# Patient Record
Sex: Male | Born: 1995 | Race: Black or African American | Hispanic: No | Marital: Married | Smoking: Former smoker
Health system: Southern US, Community
[De-identification: ages and names within clinical notes are randomized; demographics above are authoritative.]

## PROBLEM LIST (undated history)

## (undated) DIAGNOSIS — E785 Hyperlipidemia, unspecified: Secondary | ICD-10-CM

## (undated) DIAGNOSIS — N189 Chronic kidney disease, unspecified: Secondary | ICD-10-CM

## (undated) DIAGNOSIS — K219 Gastro-esophageal reflux disease without esophagitis: Secondary | ICD-10-CM

## (undated) DIAGNOSIS — J45909 Unspecified asthma, uncomplicated: Secondary | ICD-10-CM

## (undated) DIAGNOSIS — I10 Essential (primary) hypertension: Secondary | ICD-10-CM

## (undated) HISTORY — DX: Gastro-esophageal reflux disease without esophagitis: K21.9

## (undated) HISTORY — DX: Chronic kidney disease, unspecified: N18.9

## (undated) HISTORY — DX: Unspecified asthma, uncomplicated: J45.909

## (undated) HISTORY — PX: CARDIAC CATHETERIZATION: SHX172

---
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
## 2016-08-11 ENCOUNTER — Encounter: Payer: Self-pay | Admitting: Family Medicine

## 2016-08-11 ENCOUNTER — Ambulatory Visit (INDEPENDENT_AMBULATORY_CARE_PROVIDER_SITE_OTHER): Payer: Medicaid Other | Admitting: Family Medicine

## 2016-08-11 VITALS — BP 118/78 | HR 52 | Temp 98.0°F | Resp 16 | Ht 71.0 in | Wt 200.1 lb

## 2016-08-11 DIAGNOSIS — Z87442 Personal history of urinary calculi: Secondary | ICD-10-CM | POA: Insufficient documentation

## 2016-08-11 DIAGNOSIS — H00011 Hordeolum externum right upper eyelid: Secondary | ICD-10-CM

## 2016-08-11 DIAGNOSIS — Z7689 Persons encountering health services in other specified circumstances: Secondary | ICD-10-CM

## 2016-08-11 DIAGNOSIS — Z8249 Family history of ischemic heart disease and other diseases of the circulatory system: Secondary | ICD-10-CM | POA: Diagnosis not present

## 2016-08-11 DIAGNOSIS — Z9109 Other allergy status, other than to drugs and biological substances: Secondary | ICD-10-CM

## 2016-08-11 NOTE — Patient Instructions (Signed)
Need old records please  I have referred to urology for evaluation kidney stones  I have referred to eye doctor for stye  I have ordered blood work to screen for cholesterol and diabetes  See me yearly

## 2016-08-11 NOTE — Progress Notes (Signed)
Chief Complaint  Patient presents with  . 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 Kidney stones and recurrent back pain 2. Stye on  R eye for over a year 3. Chronic insomnia     Patient Active Problem List   Diagnosis Date Noted  . Environmental allergies 08/11/2016  . History of kidney stones 08/11/2016  . Family history of premature coronary heart disease 08/11/2016    Outpatient Encounter Prescriptions as of 08/11/2016  Medication Sig  . cetirizine (ZYRTEC) 10 MG tablet Take 10 mg by mouth daily.  . montelukast (SINGULAIR) 10 MG tablet Take 10 mg by mouth at bedtime.   No facility-administered encounter medications on file as of 08/11/2016.     Past Medical History:  Diagnosis Date  . Asthma   . Chronic kidney disease    stones  . GERD (gastroesophageal reflux disease)     No past surgical history on file.  Social History   Social History  . Marital status: Single    Spouse name: N/A  . Number of children: N/A  . Years of education: N/A   Occupational History  . Not on file.   Social History Main Topics  . Smoking status: Never Smoker  . Smokeless tobacco: Never Used  . Alcohol use No  . Drug use: No  . Sexual activity: Yes    Birth control/ protection: Pill   Other Topics Concern  . Not on file   Social History Narrative  . No narrative on file    Family History  Problem Relation Age of Onset  . Asthma Mother   . Cancer Mother     breast  . Miscarriages / Stillbirths Mother   . Kidney disease Mother     stones    . Diabetes Father   . Heart disease Father 43     MI  . Asthma Brother     Review of Systems  Constitutional: Negative for chills, fever and weight loss.  HENT: Negative for congestion and hearing loss.   Eyes: Positive for redness. Negative for blurred vision and pain.  Respiratory: Negative for cough and shortness of breath.   Cardiovascular: Negative for chest pain and leg swelling.  Gastrointestinal: Negative for abdominal pain, constipation, diarrhea and heartburn.  Genitourinary: Negative for dysuria and frequency.  Musculoskeletal: Positive for back pain. Negative for falls, joint pain and myalgias.  Neurological: Negative for dizziness, seizures and headaches.  Psychiatric/Behavioral: Negative for depression. The patient has insomnia. The patient is not nervous/anxious.     BP 118/78 (BP Location: Left Arm, Patient Position: Sitting, Cuff Size: Normal)   Pulse (!) 52   Temp 98 F (36.7 C) (Oral)   Resp 16   Ht 5\' 11"  (1.803 m)   Wt 200 lb 1.9 oz (90.8 kg)   SpO2 100%   BMI 27.91 kg/m   Physical Exam  Constitutional: He is oriented to person, place, and time. He appears well-developed and well-nourished.  HENT:  Head: Normocephalic and atraumatic.  Right Ear: External ear normal.  Left Ear:  External ear normal.  Mouth/Throat: Oropharynx is clear and moist.  Eyes: Conjunctivae are normal. Pupils are equal, round, and reactive to light. Right eye exhibits hordeolum.    Neck: Normal range of motion. Neck supple. No thyromegaly present.  Cardiovascular: Normal rate, regular rhythm and normal heart sounds.   Pulmonary/Chest: Effort normal and breath sounds normal. No respiratory distress.  Abdominal: Soft. Bowel sounds are normal.  No CVAT  Musculoskeletal: Normal range of motion. He exhibits no edema.  Lymphadenopathy:    He has no cervical adenopathy.  Neurological: He is alert and oriented to person, place, and time.  Gait normal  Skin: Skin is warm and dry.   Psychiatric: He has a normal mood and affect. His behavior is normal. Thought content normal.  Nursing note and vitals reviewed.  ASSESSMENT/PLAN:  1. Environmental allergies Continue current therapy  2. History of kidney stones Drink water! - Ambulatory referral to Urology  3. Family history of premature coronary heart disease - Lipid panel - Glucose  4. Encounter to establish care with new doctor   5. Hordeolum externum of right upper eyelid - Ambulatory referral to Ophthalmology   Patient Instructions  Need old records please  I have referred to urology for evaluation kidney stones  I have referred to eye doctor for stye  I have ordered blood work to screen for cholesterol and diabetes  See me yearly   Thomas MooreYvonne Sue Mellonie Guess, MD

## 2016-08-12 ENCOUNTER — Encounter: Payer: Self-pay | Admitting: Family Medicine

## 2016-08-12 LAB — LIPID PANEL
CHOL/HDL RATIO: 4.7 ratio (ref <5.0)
Cholesterol: 180 mg/dL — ABNORMAL HIGH (ref <170)
HDL: 38 mg/dL — ABNORMAL LOW (ref >45)
LDL CALC: 118 mg/dL — AB (ref <110)
Triglycerides: 120 mg/dL — ABNORMAL HIGH (ref <90)
VLDL: 24 mg/dL (ref <30)

## 2016-08-12 LAB — GLUCOSE, RANDOM: GLUCOSE: 87 mg/dL (ref 65–99)

## 2017-06-04 ENCOUNTER — Telehealth: Payer: Self-pay | Admitting: Family Medicine

## 2017-06-04 NOTE — Telephone Encounter (Signed)
These referrals were placed at his December visit.

## 2017-06-04 NOTE — Telephone Encounter (Signed)
Patient brought in his changed medicaid card so he could get his referrals to the kidney specialist & eye doctor. Requesting [place] location because he goes to school there. Cb#: [phone]

## 2017-08-11 ENCOUNTER — Ambulatory Visit: Payer: Medicaid Other | Admitting: Family Medicine

## 2017-09-18 ENCOUNTER — Encounter: Payer: Self-pay | Admitting: Family Medicine

## 2017-09-18 ENCOUNTER — Ambulatory Visit (INDEPENDENT_AMBULATORY_CARE_PROVIDER_SITE_OTHER): Payer: Medicaid Other | Admitting: Family Medicine

## 2017-09-18 ENCOUNTER — Other Ambulatory Visit: Payer: Self-pay

## 2017-09-18 VITALS — BP 116/72 | HR 60 | Temp 97.5°F | Resp 16 | Ht 71.0 in | Wt 198.1 lb

## 2017-09-18 DIAGNOSIS — Z0000 Encounter for general adult medical examination without abnormal findings: Secondary | ICD-10-CM

## 2017-09-18 DIAGNOSIS — E786 Lipoprotein deficiency: Secondary | ICD-10-CM

## 2017-09-18 DIAGNOSIS — L74519 Primary focal hyperhidrosis, unspecified: Secondary | ICD-10-CM | POA: Insufficient documentation

## 2017-09-18 DIAGNOSIS — L749 Eccrine sweat disorder, unspecified: Secondary | ICD-10-CM | POA: Diagnosis not present

## 2017-09-18 DIAGNOSIS — H0013 Chalazion right eye, unspecified eyelid: Secondary | ICD-10-CM | POA: Insufficient documentation

## 2017-09-18 DIAGNOSIS — H0011 Chalazion right upper eyelid: Secondary | ICD-10-CM

## 2017-09-18 DIAGNOSIS — Z23 Encounter for immunization: Secondary | ICD-10-CM | POA: Diagnosis not present

## 2017-09-18 DIAGNOSIS — Z87442 Personal history of urinary calculi: Secondary | ICD-10-CM | POA: Diagnosis not present

## 2017-09-18 MED ORDER — ALUMINUM CHLORIDE 20 % EX SOLN: Freq: Every day | CUTANEOUS | 11 refills | Status: DC

## 2017-09-18 NOTE — Patient Instructions (Signed)
Push fluids Drink water every day I have placed referral to urology I have placed referral for the eye bump Try the dry sol for the perspiration Let me know if this is NOT effective and we will do lab testing

## 2017-09-18 NOTE — Progress Notes (Signed)
Chief Complaint  Patient presents with  . Follow-up    13 months   Patient is here for an annual wellness exam. He eats well and exercises.  We discussed his low HDL.  We discussed the basics of a low-cholesterol diet. He had a chalazion when he was here last year.  He still has a bump on his eyelid.  He is referred to ophthalmology. He has a history of kidney stones.  Verified on CAT scan.  He has periodic back pain.  He tries to drink a lot of water.  He would like a referral to urology to see if these need to be treated. His only new complaint is of excessive perspiration.  Only his axilla.  It happens randomly, even at rest.  We discussed differential diagnosis.  Treatment options.  I will give him a prescription for Drysol. We discussed yearly flu shots are recommended.  He declines.  Patient Active Problem List   Diagnosis Date Noted  . Low HDL (under 40) 09/18/2017  . Chalazion of right eye 09/18/2017  . Excessive sweating, local 09/18/2017  . Environmental allergies 08/11/2016  . History of kidney stones 08/11/2016  . Family history of premature coronary heart disease 08/11/2016    Outpatient Encounter Medications as of 09/18/2017  Medication Sig  . cetirizine (ZYRTEC) 10 MG tablet Take 10 mg by mouth daily.  Marland Kitchen aluminum chloride (DRYSOL) 20 % external solution Apply topically at bedtime.  . montelukast (SINGULAIR) 10 MG tablet Take 10 mg by mouth at bedtime.   No facility-administered encounter medications on file as of 09/18/2017.     No Known Allergies  Review of Systems  Constitutional: Positive for diaphoresis. Negative for activity change, appetite change, chills, fever and unexpected weight change.  HENT: Negative for congestion and dental problem.   Eyes: Negative.  Negative for photophobia and visual disturbance.       Bump on right upper eyelid  Respiratory: Negative for cough and choking.   Cardiovascular: Negative for chest pain and palpitations.    Gastrointestinal: Negative for constipation and diarrhea.  Genitourinary: Positive for flank pain. Negative for difficulty urinating and frequency.  Musculoskeletal: Negative for arthralgias and back pain.  Neurological: Negative for light-headedness and headaches.  Psychiatric/Behavioral: Negative for decreased concentration and dysphoric mood.    BP 116/72 (BP Location: Left Arm, Patient Position: Sitting, Cuff Size: Normal)   Pulse 60   Temp (!) 97.5 F (36.4 C) (Temporal)   Resp 16   Ht 5\' 11"  (1.803 m)   Wt 198 lb 1.3 oz (89.8 kg)   SpO2 100%   BMI 27.63 kg/m   Physical Exam BP 116/72 (BP Location: Left Arm, Patient Position: Sitting, Cuff Size: Normal)   Pulse 60   Temp (!) 97.5 F (36.4 C) (Temporal)   Resp 16   Ht 5\' 11"  (1.803 m)   Wt 198 lb 1.3 oz (89.8 kg)   SpO2 100%   BMI 27.63 kg/m   General Appearance:    Alert, cooperative, no distress, appears stated age  Head:    Normocephalic, without obvious abnormality, atraumatic  Eyes:    PERRL, conjunctiva/corneas clear, EOM's intact, fundi    benign, both eyes.  Small chalazion right upper lid       Ears:    Normal TM's and external ear canals, both ears  Nose:   Nares normal, septum midline, mucosa normal, no drainage   or sinus tenderness  Throat:   Lips, mucosa, and tongue normal;  teeth and gums normal  Neck:   Supple, symmetrical, trachea midline, no adenopathy;       thyroid:  No enlargement/tenderness/nodules; no carotid   bruit or JVD  Back:     Symmetric, no curvature, ROM normal, no CVA tenderness  Lungs:     Clear to auscultation bilaterally, respirations unlabored  Chest wall:    No tenderness or deformity  Heart:    Regular rate and rhythm, S1 and S2 normal, no murmur, rub   or gallop  Abdomen:     Soft, non-tender, bowel sounds active all four quadrants,    no masses, no organomegaly  Extremities:   Extremities normal, atraumatic, no cyanosis or edema  Pulses:   2+ and symmetric all extremities   Skin:   Skin color, texture, turgor normal, no rashes or lesions  Lymph nodes:   Cervical, supraclavicular, and axillary nodes normal  Neurologic:   CNII-XII intact. Normal strength, sensation and reflexes      throughout    ASSESSMENT/PLAN:  1. History of kidney stones  - Ambulatory referral to Urology  2. Sweating abnormality   3. Chalazion of right upper eyelid  - Ambulatory referral to Ophthalmology  4. Need for influenza vaccination  - Flu Vaccine QUAD 36+ mos IM  5. Low HDL (under 40)   6. Excessive sweating, local  7.  Annual exam  Patient Instructions  Push fluids Drink water every day I have placed referral to urology I have placed referral for the eye bump Try the dry sol for the perspiration Let me know if this is NOT effective and we will do lab testing   Thomas Moore, MD

## 2018-02-05 ENCOUNTER — Encounter: Payer: Self-pay | Admitting: Family Medicine

## 2021-07-11 ENCOUNTER — Emergency Department (HOSPITAL_BASED_OUTPATIENT_CLINIC_OR_DEPARTMENT_OTHER): Payer: 59

## 2021-07-11 ENCOUNTER — Encounter (HOSPITAL_BASED_OUTPATIENT_CLINIC_OR_DEPARTMENT_OTHER): Payer: Self-pay | Admitting: Emergency Medicine

## 2021-07-11 ENCOUNTER — Other Ambulatory Visit: Payer: Self-pay

## 2021-07-11 ENCOUNTER — Inpatient Hospital Stay (HOSPITAL_BASED_OUTPATIENT_CLINIC_OR_DEPARTMENT_OTHER)
Admission: EM | Admit: 2021-07-11 | Discharge: 2021-07-16 | DRG: 286 | Disposition: A | Discharge status: Home / Self Care | Payer: 59 | Attending: Internal Medicine | Admitting: Internal Medicine

## 2021-07-11 DIAGNOSIS — Z79899 Other long term (current) drug therapy: Secondary | ICD-10-CM | POA: Diagnosis not present

## 2021-07-11 DIAGNOSIS — Z8249 Family history of ischemic heart disease and other diseases of the circulatory system: Secondary | ICD-10-CM

## 2021-07-11 DIAGNOSIS — I5021 Acute systolic (congestive) heart failure: Principal | ICD-10-CM | POA: Diagnosis present

## 2021-07-11 DIAGNOSIS — I5082 Biventricular heart failure: Secondary | ICD-10-CM | POA: Diagnosis present

## 2021-07-11 DIAGNOSIS — M25551 Pain in right hip: Secondary | ICD-10-CM | POA: Diagnosis present

## 2021-07-11 DIAGNOSIS — R634 Abnormal weight loss: Secondary | ICD-10-CM | POA: Diagnosis present

## 2021-07-11 DIAGNOSIS — I081 Rheumatic disorders of both mitral and tricuspid valves: Secondary | ICD-10-CM | POA: Diagnosis present

## 2021-07-11 DIAGNOSIS — I428 Other cardiomyopathies: Secondary | ICD-10-CM | POA: Diagnosis present

## 2021-07-11 DIAGNOSIS — M25552 Pain in left hip: Secondary | ICD-10-CM | POA: Diagnosis present

## 2021-07-11 DIAGNOSIS — Z6826 Body mass index (BMI) 26.0-26.9, adult: Secondary | ICD-10-CM

## 2021-07-11 DIAGNOSIS — Z23 Encounter for immunization: Secondary | ICD-10-CM | POA: Diagnosis not present

## 2021-07-11 DIAGNOSIS — Z825 Family history of asthma and other chronic lower respiratory diseases: Secondary | ICD-10-CM

## 2021-07-11 DIAGNOSIS — I5020 Unspecified systolic (congestive) heart failure: Secondary | ICD-10-CM | POA: Diagnosis not present

## 2021-07-11 DIAGNOSIS — E876 Hypokalemia: Secondary | ICD-10-CM | POA: Diagnosis not present

## 2021-07-11 DIAGNOSIS — Z841 Family history of disorders of kidney and ureter: Secondary | ICD-10-CM

## 2021-07-11 DIAGNOSIS — F1290 Cannabis use, unspecified, uncomplicated: Secondary | ICD-10-CM | POA: Diagnosis present

## 2021-07-11 DIAGNOSIS — I340 Nonrheumatic mitral (valve) insufficiency: Secondary | ICD-10-CM | POA: Diagnosis not present

## 2021-07-11 DIAGNOSIS — N189 Chronic kidney disease, unspecified: Secondary | ICD-10-CM | POA: Diagnosis present

## 2021-07-11 DIAGNOSIS — K219 Gastro-esophageal reflux disease without esophagitis: Secondary | ICD-10-CM | POA: Diagnosis present

## 2021-07-11 DIAGNOSIS — N179 Acute kidney failure, unspecified: Secondary | ICD-10-CM | POA: Diagnosis present

## 2021-07-11 DIAGNOSIS — R0602 Shortness of breath: Secondary | ICD-10-CM | POA: Diagnosis present

## 2021-07-11 DIAGNOSIS — I509 Heart failure, unspecified: Secondary | ICD-10-CM

## 2021-07-11 DIAGNOSIS — Z87891 Personal history of nicotine dependence: Secondary | ICD-10-CM | POA: Diagnosis not present

## 2021-07-11 DIAGNOSIS — Z20822 Contact with and (suspected) exposure to covid-19: Secondary | ICD-10-CM | POA: Diagnosis present

## 2021-07-11 DIAGNOSIS — Z833 Family history of diabetes mellitus: Secondary | ICD-10-CM | POA: Diagnosis not present

## 2021-07-11 DIAGNOSIS — Z8241 Family history of sudden cardiac death: Secondary | ICD-10-CM | POA: Diagnosis not present

## 2021-07-11 DIAGNOSIS — R570 Cardiogenic shock: Secondary | ICD-10-CM | POA: Diagnosis not present

## 2021-07-11 DIAGNOSIS — R0609 Other forms of dyspnea: Secondary | ICD-10-CM | POA: Diagnosis not present

## 2021-07-11 DIAGNOSIS — I493 Ventricular premature depolarization: Secondary | ICD-10-CM | POA: Diagnosis present

## 2021-07-11 DIAGNOSIS — I5022 Chronic systolic (congestive) heart failure: Secondary | ICD-10-CM

## 2021-07-11 HISTORY — DX: Heart failure, unspecified: I50.9

## 2021-07-11 LAB — CBC WITH DIFFERENTIAL/PLATELET
Abs Immature Granulocytes: 0.02 10*3/uL (ref 0.00–0.07)
Abs Immature Granulocytes: 0.03 10*3/uL (ref 0.00–0.07)
Basophils Absolute: 0 10*3/uL (ref 0.0–0.1)
Basophils Absolute: 0 10*3/uL (ref 0.0–0.1)
Basophils Relative: 1 %
Basophils Relative: 0 %
Eosinophils Absolute: 0 10*3/uL (ref 0.0–0.5)
Eosinophils Absolute: 0 10*3/uL (ref 0.0–0.5)
Eosinophils Relative: 0 %
Eosinophils Relative: 0 %
HCT: 48.1 % (ref 39.0–52.0)
HCT: 48.5 % (ref 39.0–52.0)
Hemoglobin: 15.7 g/dL (ref 13.0–17.0)
Hemoglobin: 15.7 g/dL (ref 13.0–17.0)
Immature Granulocytes: 0 %
Immature Granulocytes: 0 %
Lymphocytes Relative: 24 %
Lymphocytes Relative: 21 %
Lymphs Abs: 2.1 10*3/uL (ref 0.7–4.0)
Lymphs Abs: 1.9 10*3/uL (ref 0.7–4.0)
MCH: 29.7 pg (ref 26.0–34.0)
MCH: 29 pg (ref 26.0–34.0)
MCHC: 32.6 g/dL (ref 30.0–36.0)
MCHC: 32.4 g/dL (ref 30.0–36.0)
MCV: 90.9 fL (ref 80.0–100.0)
MCV: 89.6 fL (ref 80.0–100.0)
Monocytes Absolute: 0.6 10*3/uL (ref 0.1–1.0)
Monocytes Absolute: 0.6 10*3/uL (ref 0.1–1.0)
Monocytes Relative: 7 %
Monocytes Relative: 6 %
Neutro Abs: 5.8 10*3/uL (ref 1.7–7.7)
Neutro Abs: 6.5 10*3/uL (ref 1.7–7.7)
Neutrophils Relative %: 68 %
Neutrophils Relative %: 73 %
Platelets: 252 10*3/uL (ref 150–400)
Platelets: 273 10*3/uL (ref 150–400)
RBC: 5.29 MIL/uL (ref 4.22–5.81)
RBC: 5.41 MIL/uL (ref 4.22–5.81)
RDW: 12.9 % (ref 11.5–15.5)
RDW: 12.8 % (ref 11.5–15.5)
WBC: 8.5 10*3/uL (ref 4.0–10.5)
WBC: 9.1 10*3/uL (ref 4.0–10.5)
nRBC: 0 % (ref 0.0–0.2)
nRBC: 0 % (ref 0.0–0.2)

## 2021-07-11 LAB — RESP PANEL BY RT-PCR (FLU A&B, COVID) ARPGX2
Influenza A by PCR: NEGATIVE
Influenza B by PCR: NEGATIVE
SARS Coronavirus 2 by RT PCR: NEGATIVE

## 2021-07-11 LAB — BASIC METABOLIC PANEL
Anion gap: 9 (ref 5–15)
BUN: 19 mg/dL (ref 6–20)
CO2: 24 mmol/L (ref 22–32)
Calcium: 9.4 mg/dL (ref 8.9–10.3)
Chloride: 108 mmol/L (ref 98–111)
Creatinine, Ser: 1.32 mg/dL — ABNORMAL HIGH (ref 0.61–1.24)
GFR, Estimated: >60 mL/min (ref >60)
Glucose, Bld: 98 mg/dL (ref 70–99)
Potassium: 4.1 mmol/L (ref 3.5–5.1)
Sodium: 141 mmol/L (ref 135–145)

## 2021-07-11 LAB — COMPREHENSIVE METABOLIC PANEL
ALT: 48 U/L — ABNORMAL HIGH (ref 0–44)
AST: 38 U/L (ref 15–41)
Albumin: 4.3 g/dL (ref 3.5–5.0)
Alkaline Phosphatase: 56 U/L (ref 38–126)
Anion gap: 11 (ref 5–15)
BUN: 15 mg/dL (ref 6–20)
CO2: 23 mmol/L (ref 22–32)
Calcium: 9.6 mg/dL (ref 8.9–10.3)
Chloride: 105 mmol/L (ref 98–111)
Creatinine, Ser: 1.4 mg/dL — ABNORMAL HIGH (ref 0.61–1.24)
GFR, Estimated: >60 mL/min (ref >60)
Glucose, Bld: 95 mg/dL (ref 70–99)
Potassium: 3.9 mmol/L (ref 3.5–5.1)
Sodium: 139 mmol/L (ref 135–145)
Total Bilirubin: 3.3 mg/dL — ABNORMAL HIGH (ref 0.3–1.2)
Total Protein: 7.3 g/dL (ref 6.5–8.1)

## 2021-07-11 LAB — MAGNESIUM
Magnesium: 1.8 mg/dL (ref 1.7–2.4)
Magnesium: 1.9 mg/dL (ref 1.7–2.4)

## 2021-07-11 LAB — BRAIN NATRIURETIC PEPTIDE: B Natriuretic Peptide: 1884 pg/mL — ABNORMAL HIGH (ref 0.0–100.0)

## 2021-07-11 LAB — TSH
TSH: 4.292 u[IU]/mL (ref 0.350–4.500)
TSH: 6.062 u[IU]/mL — ABNORMAL HIGH (ref 0.350–4.500)

## 2021-07-11 MED ORDER — ALBUTEROL SULFATE (2.5 MG/3ML) 0.083% IN NEBU: 2.5000 mg | INHALATION_SOLUTION | RESPIRATORY_TRACT | Status: DC | PRN

## 2021-07-11 MED ORDER — SODIUM CHLORIDE 0.9 % IV SOLN: 250.0000 mL | INTRAVENOUS | Status: DC | PRN

## 2021-07-11 MED ORDER — SODIUM CHLORIDE 0.9% FLUSH: 3.0000 mL | INTRAVENOUS | Status: DC | PRN

## 2021-07-11 MED ORDER — FUROSEMIDE 10 MG/ML IJ SOLN
20.0000 mg | Freq: Once | INTRAMUSCULAR | Status: AC
  Administered 2021-07-11: 20 mg via INTRAVENOUS
  Filled 2021-07-11: qty 2

## 2021-07-11 MED ORDER — INFLUENZA VAC SPLIT QUAD 0.5 ML IM SUSY
0.5000 mL | PREFILLED_SYRINGE | INTRAMUSCULAR | Status: AC
  Administered 2021-07-13: 0.5 mL via INTRAMUSCULAR
  Filled 2021-07-11: qty 0.5

## 2021-07-11 MED ORDER — ALBUTEROL SULFATE HFA 108 (90 BASE) MCG/ACT IN AERS
2.0000 | INHALATION_SPRAY | RESPIRATORY_TRACT | Status: DC | PRN
  Filled 2021-07-11: qty 6.7

## 2021-07-11 MED ORDER — SODIUM CHLORIDE 0.9% FLUSH
3.0000 mL | Freq: Two times a day (BID) | INTRAVENOUS | Status: DC
  Administered 2021-07-11 – 2021-07-12 (×2): 3 mL via INTRAVENOUS

## 2021-07-11 MED ORDER — IPRATROPIUM-ALBUTEROL 0.5-2.5 (3) MG/3ML IN SOLN
3.0000 mL | Freq: Once | RESPIRATORY_TRACT | Status: AC
  Administered 2021-07-11: 3 mL via RESPIRATORY_TRACT
  Filled 2021-07-11: qty 3

## 2021-07-11 MED ORDER — HEPARIN SODIUM (PORCINE) 5000 UNIT/ML IJ SOLN
5000.0000 [IU] | Freq: Three times a day (TID) | INTRAMUSCULAR | Status: DC
  Administered 2021-07-11 – 2021-07-12 (×2): 5000 [IU] via SUBCUTANEOUS
  Filled 2021-07-11 (×2): qty 1

## 2021-07-11 MED ORDER — ONDANSETRON HCL 4 MG/2ML IJ SOLN: 4.0000 mg | Freq: Four times a day (QID) | INTRAMUSCULAR | Status: DC | PRN

## 2021-07-11 MED ORDER — ACETAMINOPHEN 325 MG PO TABS
650.0000 mg | ORAL_TABLET | ORAL | Status: DC | PRN
  Administered 2021-07-12: 650 mg via ORAL
  Filled 2021-07-11: qty 2

## 2021-07-11 NOTE — ED Provider Notes (Signed)
[facility] EMERGENCY DEPARTMENT Provider Note   CSN: [id number] Arrival date & time: 07/11/21  1229     History Chief Complaint  Patient presents with   Shortness of Breath    Thomas Wood is a 25 y.o. male with a past medical history of asthma and obesity presenting today with a complaint of shortness of breath and cardiomegaly.  Patient reports he was seen in urgent care because he was experiencing dyspnea on exertion for the past 2 weeks.  No chest pain, dizziness, cough or other symptoms.  Denies swelling in his legs.  He was previously around 300 pounds however he lost over 100 pounds.  Urgent care evaluated his shortness of breath with an x-ray and it showed an enlarged heart.  They sent him to the emergency department for further testing.   Past Medical History:  Diagnosis Date   Asthma    Chronic kidney disease    stones   GERD (gastroesophageal reflux disease)     Patient Active Problem List   Diagnosis Date Noted   Low HDL (under 40) 09/18/2017   Chalazion of right eye 09/18/2017   Excessive sweating, local 09/18/2017   Environmental allergies 08/11/2016   History of kidney stones 08/11/2016   Family history of premature coronary heart disease 08/11/2016    History reviewed. No pertinent surgical history.     Family History  Problem Relation Age of Onset   Asthma Mother    Cancer Mother        breast   Miscarriages / Stillbirths Mother    Kidney disease Mother        stones   Diabetes Father    Heart disease Father 36        MI   Asthma Brother     Social History   Tobacco Use   Smoking status: Never   Smokeless tobacco: Never  Substance Use Topics   Alcohol use: No   Drug use: No    Home Medications Prior to Admission medications   Medication Sig Start Date End Date Taking? Authorizing Provider  aluminum chloride (DRYSOL) 20 % external solution Apply topically at bedtime. 09/18/17   Eustace Moore, MD  cetirizine  (ZYRTEC) 10 MG tablet Take 10 mg by mouth daily.    [provider]  montelukast (SINGULAIR) 10 MG tablet Take 10 mg by mouth at bedtime.    [provider]    Allergies    Patient has no known allergies.  Review of Systems   Review of Systems  Constitutional:  Negative for chills and fever.  Respiratory:  Positive for shortness of breath. Negative for cough and chest tightness.        PND and orthopnea  Cardiovascular:  Negative for chest pain and palpitations.  Musculoskeletal:  Negative for myalgias.  Neurological:  Negative for dizziness and syncope.   Physical Exam Updated Vital Signs BP (!) 125/92 (BP Location: Left Arm)   Pulse 85   Temp 98.3 F (36.8 C) (Oral)   Resp 20   Ht 5\' 11"  (1.803 m)   Wt 89.4 kg   SpO2 98%   BMI 27.48 kg/m   Physical Exam Vitals and nursing note reviewed.  Constitutional:      Appearance: Normal appearance.  HENT:     Head: Normocephalic and atraumatic.  Eyes:     General: No scleral icterus.    Conjunctiva/sclera: Conjunctivae normal.  Cardiovascular:     Rate and Rhythm: Normal rate and  regular rhythm. Extrasystoles (occasional extra beat) are present. Pulmonary:     Effort: Pulmonary effort is normal. No tachypnea or respiratory distress.     Breath sounds: No decreased breath sounds, wheezing, rhonchi or rales.  Chest:     Chest wall: No tenderness.  Skin:    Findings: No rash.  Neurological:     Mental Status: He is alert.  Psychiatric:        Mood and Affect: Mood normal.        Behavior: Behavior normal.    ED Results / Procedures / Treatments   Labs (all labs ordered are listed, but only abnormal results are displayed) Labs Reviewed  BRAIN NATRIURETIC PEPTIDE - Abnormal; Notable for the following components:      Result Value   B Natriuretic Peptide 1,884.0 (*)    All other components within normal limits  BASIC METABOLIC PANEL - Abnormal; Notable for the following components:   Creatinine, Ser  1.32 (*)    All other components within normal limits  RESP PANEL BY RT-PCR (FLU A&B, COVID) ARPGX2  CBC WITH DIFFERENTIAL/PLATELET  MAGNESIUM  TSH    EKG EKG Interpretation  Date/Time:  Thursday July 11 2021 13:02:40 EST Ventricular Rate:  100 PR Interval:  164 QRS Duration: 108 QT Interval:  364 QTC Calculation: 469 R Axis:   -32 Text Interpretation: nsr + PVCs Left axis deviation Low voltage QRS Inferior infarct , age undetermined Cannot rule out Anterior infarct , age undetermined Abnormal ECG No old tracing to compare Confirmed by Isla Pence [phone]) on 07/11/2021 1:49:54 PM  Radiology DG Chest 2 View  Result Date: 07/11/2021 CLINICAL DATA:  Shortness of breath for 2 days. Nonproductive cough. Former smoker. EXAM: CHEST - 2 VIEW COMPARISON:  None. FINDINGS: The cardiac silhouette is mildly enlarged. There is peribronchial thickening bilaterally. No confluent airspace opacity, overt pulmonary edema, pleural effusion, or pneumothorax is identified. No acute osseous abnormality is seen. IMPRESSION: Bronchitic changes and mild cardiomegaly. Electronically Signed   By: Logan Bores M.D.   On: 07/11/2021 13:22    Procedures Procedures   Medications Ordered in ED Medications  albuterol (VENTOLIN HFA) 108 (90 Base) MCG/ACT inhaler 2 puff (has no administration in time range)    ED Course  I have reviewed the triage vital signs and the nursing notes.  Pertinent labs & imaging results that were available during my care of the patient were reviewed by me and considered in my medical decision making (see chart for details).    MDM Rules/Calculators/A&P The emergent differential diagnosis for shortness of breath includes, but is not limited to, Pulmonary edema, bronchoconstriction, Pneumonia, Pulmonary embolism, Pneumotherax/ Hemothorax, Dysrythmia, ACS.  All of these were considered throughout the evaluation of this patient.   Physical exam was unremarkable.  Lungs sounded  clear and he was not tachycardic.  Reported that he recently lost a lot of weight, however his previous obesity likely resulted in some cardiomegaly.  EKG showed PVCs.  Basic labs nonrevealing however BNP noted to be 1,884.  Currently I do not have an explanation for his new onset heart failure. Cardiologist, Hilty, was consulted and suggested that we admit the patient to the cardiology service at [facility].  I spoke with the patient about this and he is agreeable to the plan.  Final Clinical Impression(s) / ED Diagnoses Final diagnoses:  Acute heart failure, unspecified heart failure type ([place])    Rx / DC Orders Admit to cardiology service at [facility].  Transfer  via [facility].    Rhae Hammock, PA-C 07/11/21 [place], Julie, MD 07/18/21 [phone]

## 2021-07-11 NOTE — H&P (Addendum)
Cardiology Admission History and Physical:   Patient ID: Thomas Wood MRN: [id number]; DOB: Jun 21, 1996   Admission date: 07/11/2021  PCP:  Pcp, No   [facility] Providers Cardiologist:  Thomas Klein, MD   - new   Chief Complaint:  shortness of breath  Patient Profile:   Thomas Wood is a 25 y.o. male with a history of asthma and obesity who is being seen 07/11/2021 for the evaluation of shortness of breath.  History of Present Illness:   Thomas Wood presented to Urgent Care with 2 weeks of progressive exertional dyspnea. CXR at [facility] showed cardiomegaly and he was sent to [place]. EKG with PVCs. BNP noted to be 1884. New onset CHF was suspected and cardiology was consulted and accepted for admission to [facility].   Mg 1.8 sCr 1.32, K 4.1 COVID/flu negative  He works as a Tax adviser at [employer]. Starting one week ago, he notices shortness of breath when walking up a flight of stairs at work. Yesterday it was significantly worse and today he had to sit for several minutes to catch his breath after stairs. He denies chest pain and palpitations, but does describes symptoms consistent with both orthopnea and PND. He denies syncope. He received one dose of 20 mg IV lasix in the ER and reports good diuresis and feels much better.  He does not regularly see a PCP and has no other chronic health problems. He has a history of bilateral hip pain in adolescence that he feels was never fully worked up. He has had an unintentional weight loss of about 30 lbs over the last several months without exercise. His job is not very physically demanding, but he stands for the majority of his shift.   He does not smoke cigarettes, drink alcohol, or use illicit drugs.   He does not exercise any longer, but was running about 2 miles on the treadmill 6 months ago.   His father had several MI's in his 83s with heart failure, ICD, LVAD and finally heart transplant 2 years  ago (in his 61s). Both parents have DM. He has one "full" sibling who is overweight but otherwise healthy.    Past Medical History:  Diagnosis Date   Asthma    Chronic kidney disease    stones   GERD (gastroesophageal reflux disease)    New onset of congestive heart failure ([place]) 07/11/2021    History reviewed. No pertinent surgical history.   Medications Prior to Admission: Prior to Admission medications   Medication Sig Start Date End Date Taking? Authorizing Provider  aluminum chloride (DRYSOL) 20 % external solution Apply topically at bedtime. 09/18/17   Raylene Everts, MD  cetirizine (ZYRTEC) 10 MG tablet Take 10 mg by mouth daily.    [provider]  montelukast (SINGULAIR) 10 MG tablet Take 10 mg by mouth at bedtime.    [provider]     Allergies:   No Known Allergies  Social History:   Social History   Socioeconomic History   Marital status: Single    Spouse name: Not on file   Number of children: Not on file   Years of education: Not on file   Highest education level: Not on file  Occupational History   Not on file  Tobacco Use   Smoking status: Never   Smokeless tobacco: Never  Substance and Sexual Activity   Alcohol use: No   Drug use: No   Sexual activity: Yes  Birth control/protection: Pill  Other Topics Concern   Not on file  Social History Narrative   Not on file   Social Determinants of Health   Financial Resource Strain: Not on file  Food Insecurity: Not on file  Transportation Needs: Not on file  Physical Activity: Not on file  Stress: Not on file  Social Connections: Not on file  Intimate Partner Violence: Not on file    Family History:   The patient's family history includes Asthma in his brother and mother; Cancer in his mother; Diabetes in his father; Heart disease (age of onset: 58) in his father; Kidney disease in his mother; Miscarriages / [place] in his mother.    ROS:  Please see the history of  present illness.  All other ROS reviewed and negative.     Physical Exam/Data:   Vitals:   07/11/21 1500 07/11/21 1700 07/11/21 1800 07/11/21 1855  BP: 111/82 (!) 117/93 118/89 118/85  Pulse: 92 99 (!) 102 (!) 102  Resp: 20  20 18   Temp:    98.2 F (36.8 C)  TempSrc:    Oral  SpO2: 100% 100% 100% 98%  Weight:      Height:       No intake or output data in the 24 hours ending 07/11/21 1914 Last 3 Weights 07/11/2021 07/11/2021 09/18/2017  Weight (lbs) 197 lb 197 lb 198 lb 1.3 oz  Weight (kg) 89.359 kg 89.359 kg 89.848 kg     Body mass index is 27.48 kg/m.  General:  Well nourished, well developed, in no acute distress HEENT: normal Neck: + JVD Vascular: No carotid bruits; Distal pulses 2+ bilaterally   Cardiac:  normal S1, S2; RRR; no murmur  Lungs:  clear to auscultation bilaterally, no wheezing, rhonchi or rales  Abd: soft, nontender, no hepatomegaly  Ext: no edema Musculoskeletal:  No deformities, BUE and BLE strength normal and equal Skin: warm and dry  Neuro:  CNs 2-12 intact, no focal abnormalities noted Psych:  Normal affect    EKG:  The ECG that was done  was personally reviewed and demonstrates sinus rhythm with HR 100, left axis, PVCs, inferior Q waves  Relevant CV Studies:  Echo pending  Laboratory Data:  High Sensitivity Troponin:  No results for input(s): TROPONINIHS in the last 720 hours.    Chemistry Recent Labs  Lab 07/11/21 1401  NA 141  K 4.1  CL 108  CO2 24  GLUCOSE 98  BUN 19  CREATININE 1.32*  CALCIUM 9.4  MG 1.8  GFRNONAA >60  ANIONGAP 9    No results for input(s): PROT, ALBUMIN, AST, ALT, ALKPHOS, BILITOT in the last 168 hours. Lipids No results for input(s): CHOL, TRIG, HDL, LABVLDL, LDLCALC, CHOLHDL in the last 168 hours. Hematology Recent Labs  Lab 07/11/21 1401  WBC 8.5  RBC 5.29  HGB 15.7  HCT 48.1  MCV 90.9  MCH 29.7  MCHC 32.6  RDW 12.9  PLT 252   Thyroid No results for input(s): TSH, FREET4 in the last 168  hours. BNP Recent Labs  Lab 07/11/21 1401  BNP 1,884.0*    DDimer No results for input(s): DDIMER in the last 168 hours.   Radiology/Studies:  DG Chest 2 View  Result Date: 07/11/2021 CLINICAL DATA:  Shortness of breath for 2 days. Nonproductive cough. Former smoker. EXAM: CHEST - 2 VIEW COMPARISON:  None. FINDINGS: The cardiac silhouette is mildly enlarged. There is peribronchial thickening bilaterally. No confluent airspace opacity, overt pulmonary edema, pleural  effusion, or pneumothorax is identified. No acute osseous abnormality is seen. IMPRESSION: Bronchitic changes and mild cardiomegaly. Electronically Signed   By: Sebastian Ache M.D.   On: 07/11/2021 13:22     Assessment and Plan:   Dyspnea on exertion Will obtain basic labs and echocardiogram. He received one dose of 20 mg IV lasix in the ER and feels much better. He reports good diuresis. He denies chest pain, but does have a somewhat abnormal EKG. Will repeat a 12-lead. - BNP was elevated, but CXR did not show overt CHF   PVCs - telemetry with frequent PVCs - question of PVC burden is contributing to the above   AKI - sCr 1.32 - will hold off on additional diuresis until labs are repeated, but will likely need 40 mg IV lasix tomorrow - dehydration vs low output  - he is comfortable now, will wait for echo and labs   Abnormal EKG - low voltage, PVCs, evidence of biatrial enlargement    Risk Assessment/Risk Scores:     New York Heart Association (NYHA) Functional Class NYHA Class II     Severity of Illness: The appropriate patient status for this patient is INPATIENT. Inpatient status is judged to be reasonable and necessary in order to provide the required intensity of service to ensure the patient's safety. The patient's presenting symptoms, physical exam findings, and initial radiographic and laboratory data in the context of their chronic comorbidities is felt to place them at high risk for further  clinical deterioration. Furthermore, it is not anticipated that the patient will be medically stable for discharge from the hospital within 2 midnights of admission.   * I certify that at the point of admission it is my clinical judgment that the patient will require inpatient hospital care spanning beyond 2 midnights from the point of admission due to high intensity of service, high risk for further deterioration and high frequency of surveillance required.*   For questions or updates, please contact [facility] Please consult [email or web address] for contact info under     Signed, Marcelino Duster, PA  07/11/2021 7:14 PM   I have seen and examined the patient along with Marcelino Duster, PA .  I have reviewed the chart, notes and new data.  I agree with PA/NP's note.  Key new complaints: Symptoms began a week ago , initially with exertion, subsequently with orthopnea for last 2 nights. Dyspnea more prominent than chest tightness with activity. Was running for exercise 6 months ago, stopped due to new job demands, not due to dyspnea. Unaware of PVCs. No recent viral illness, fever, pleuritic discomfort. No toxin use. Orthopnea promptly resolved after diuretic. Key examination changes: Lean, fit-appearing. Clear lungs. RRR w occ ectopy, S3 present, no murmurs. JVP not elevated. Key new findings / data: CXR - Marked cardiomegaly w LV and LA enlargement, not typical configuration for a pericardial effusion. ECG -SR w PVCs, biatrial enlargement, PRWP, isolated Q wave in V6. BNP 1900. Creat 1.3 Note normal cardiac size on CT for renal stone study 2016  PLAN: Overall impression is of acute heart failure with systolic dysfunction. The degree of left heart enlargement and biatrial abnormality suggests a more chronic process (months, rather than a week). No real angina, but precordial lead abnormalities raise concern for anterior wall injury. Absence of pleurisy and relatively mild repol changes on ECG  do not support acute pericarditis, but this is possible.  Echocardiogram will guide further investigation and treatment. He just got diuretics 2  hours ago and is better symptomatically. Will hold off additional diuretic until tomorrow. Hold of RAAS inhibitors until labs tomorrow (I anticipate creatinine will actually improve w diuresis) and beta blockers until we are confident he is euvolemic.  Quite possibly will need R and L heart cath (tomorrow?). If LVEF is low, would involve HF team early on.  Thomas Klein, MD, [place] [phone] 07/11/2021, 8:03 PM

## 2021-07-11 NOTE — ED Notes (Signed)
Pt endorses dyspnea, worsening with ambulation

## 2021-07-11 NOTE — ED Triage Notes (Signed)
Shortness of breath x 2 days , slight dry cough . Denies fever ,

## 2021-07-12 ENCOUNTER — Inpatient Hospital Stay (HOSPITAL_COMMUNITY)
Admission: EM | Disposition: A | Discharge status: Home / Self Care | Payer: Self-pay | Source: Home / Self Care | Attending: Cardiovascular Disease

## 2021-07-12 ENCOUNTER — Inpatient Hospital Stay (HOSPITAL_COMMUNITY): Payer: 59

## 2021-07-12 ENCOUNTER — Other Ambulatory Visit (HOSPITAL_COMMUNITY): Payer: Self-pay

## 2021-07-12 ENCOUNTER — Encounter (HOSPITAL_COMMUNITY): Payer: Self-pay | Admitting: Cardiovascular Disease

## 2021-07-12 DIAGNOSIS — I5020 Unspecified systolic (congestive) heart failure: Secondary | ICD-10-CM | POA: Diagnosis not present

## 2021-07-12 DIAGNOSIS — I5082 Biventricular heart failure: Secondary | ICD-10-CM | POA: Diagnosis not present

## 2021-07-12 DIAGNOSIS — I493 Ventricular premature depolarization: Secondary | ICD-10-CM | POA: Diagnosis not present

## 2021-07-12 DIAGNOSIS — R570 Cardiogenic shock: Secondary | ICD-10-CM

## 2021-07-12 DIAGNOSIS — R0609 Other forms of dyspnea: Secondary | ICD-10-CM

## 2021-07-12 DIAGNOSIS — I340 Nonrheumatic mitral (valve) insufficiency: Secondary | ICD-10-CM

## 2021-07-12 DIAGNOSIS — I509 Heart failure, unspecified: Secondary | ICD-10-CM | POA: Diagnosis not present

## 2021-07-12 DIAGNOSIS — I5021 Acute systolic (congestive) heart failure: Principal | ICD-10-CM

## 2021-07-12 HISTORY — PX: CENTRAL LINE INSERTION: CATH118232

## 2021-07-12 HISTORY — PX: ARTERIAL LINE INSERTION: CATH118227

## 2021-07-12 HISTORY — PX: RIGHT/LEFT HEART CATH AND CORONARY ANGIOGRAPHY: CATH118266

## 2021-07-12 LAB — POCT I-STAT 7, (LYTES, BLD GAS, ICA,H+H)
Acid-base deficit: 1 mmol/L (ref 0.0–2.0)
Acid-base deficit: 10 mmol/L — ABNORMAL HIGH (ref 0.0–2.0)
Bicarbonate: 13.6 mmol/L — ABNORMAL LOW (ref 20.0–28.0)
Bicarbonate: 23.2 mmol/L (ref 20.0–28.0)
Calcium, Ion: 0.82 mmol/L — CL (ref 1.15–1.40)
Calcium, Ion: 1.21 mmol/L (ref 1.15–1.40)
HCT: 43 % (ref 39.0–52.0)
HCT: 45 % (ref 39.0–52.0)
Hemoglobin: 14.6 g/dL (ref 13.0–17.0)
Hemoglobin: 15.3 g/dL (ref 13.0–17.0)
O2 Saturation: 92 %
O2 Saturation: 97 %
Potassium: 2.8 mmol/L — ABNORMAL LOW (ref 3.5–5.1)
Potassium: 3.8 mmol/L (ref 3.5–5.1)
Sodium: 142 mmol/L (ref 135–145)
Sodium: 149 mmol/L — ABNORMAL HIGH (ref 135–145)
TCO2: 14 mmol/L — ABNORMAL LOW (ref 22–32)
TCO2: 24 mmol/L (ref 22–32)
pCO2 arterial: 25.1 mmHg — ABNORMAL LOW (ref 32.0–48.0)
pCO2 arterial: 36.2 mmHg (ref 32.0–48.0)
pH, Arterial: 7.341 — ABNORMAL LOW (ref 7.350–7.450)
pH, Arterial: 7.415 (ref 7.350–7.450)
pO2, Arterial: 65 mmHg — ABNORMAL LOW (ref 83.0–108.0)
pO2, Arterial: 86 mmHg (ref 83.0–108.0)

## 2021-07-12 LAB — COOXEMETRY PANEL
Carboxyhemoglobin: 1 % (ref 0.5–1.5)
Methemoglobin: 0.7 % (ref 0.0–1.5)
O2 Saturation: 60.8 %
Total hemoglobin: 14.8 g/dL (ref 12.0–16.0)

## 2021-07-12 LAB — POCT I-STAT EG7
Acid-base deficit: 2 mmol/L (ref 0.0–2.0)
Acid-base deficit: 3 mmol/L — ABNORMAL HIGH (ref 0.0–2.0)
Acid-base deficit: 6 mmol/L — ABNORMAL HIGH (ref 0.0–2.0)
Acid-base deficit: 7 mmol/L — ABNORMAL HIGH (ref 0.0–2.0)
Acid-base deficit: 8 mmol/L — ABNORMAL HIGH (ref 0.0–2.0)
Bicarbonate: 17.6 mmol/L — ABNORMAL LOW (ref 20.0–28.0)
Bicarbonate: 18.3 mmol/L — ABNORMAL LOW (ref 20.0–28.0)
Bicarbonate: 19.9 mmol/L — ABNORMAL LOW (ref 20.0–28.0)
Bicarbonate: 21.9 mmol/L (ref 20.0–28.0)
Bicarbonate: 22.9 mmol/L (ref 20.0–28.0)
Calcium, Ion: 0.75 mmol/L — CL (ref 1.15–1.40)
Calcium, Ion: 0.84 mmol/L — CL (ref 1.15–1.40)
Calcium, Ion: 0.97 mmol/L — ABNORMAL LOW (ref 1.15–1.40)
Calcium, Ion: 1.08 mmol/L — ABNORMAL LOW (ref 1.15–1.40)
Calcium, Ion: 1.1 mmol/L — ABNORMAL LOW (ref 1.15–1.40)
HCT: 41 % (ref 39.0–52.0)
HCT: 41 % (ref 39.0–52.0)
HCT: 42 % (ref 39.0–52.0)
HCT: 44 % (ref 39.0–52.0)
HCT: 47 % (ref 39.0–52.0)
Hemoglobin: 13.9 g/dL (ref 13.0–17.0)
Hemoglobin: 13.9 g/dL (ref 13.0–17.0)
Hemoglobin: 14.3 g/dL (ref 13.0–17.0)
Hemoglobin: 15 g/dL (ref 13.0–17.0)
Hemoglobin: 16 g/dL (ref 13.0–17.0)
O2 Saturation: 41 %
O2 Saturation: 61 %
O2 Saturation: 62 %
O2 Saturation: 63 %
O2 Saturation: 66 %
Potassium: 2.4 mmol/L — CL (ref 3.5–5.1)
Potassium: 2.6 mmol/L — CL (ref 3.5–5.1)
Potassium: 2.8 mmol/L — ABNORMAL LOW (ref 3.5–5.1)
Potassium: 3.3 mmol/L — ABNORMAL LOW (ref 3.5–5.1)
Potassium: 3.5 mmol/L (ref 3.5–5.1)
Sodium: 144 mmol/L (ref 135–145)
Sodium: 144 mmol/L (ref 135–145)
Sodium: 147 mmol/L — ABNORMAL HIGH (ref 135–145)
Sodium: 148 mmol/L — ABNORMAL HIGH (ref 135–145)
Sodium: 150 mmol/L — ABNORMAL HIGH (ref 135–145)
TCO2: 19 mmol/L — ABNORMAL LOW (ref 22–32)
TCO2: 19 mmol/L — ABNORMAL LOW (ref 22–32)
TCO2: 21 mmol/L — ABNORMAL LOW (ref 22–32)
TCO2: 23 mmol/L (ref 22–32)
TCO2: 24 mmol/L (ref 22–32)
pCO2, Ven: 35.6 mmHg — ABNORMAL LOW (ref 44.0–60.0)
pCO2, Ven: 35.9 mmHg — ABNORMAL LOW (ref 44.0–60.0)
pCO2, Ven: 36.1 mmHg — ABNORMAL LOW (ref 44.0–60.0)
pCO2, Ven: 37.2 mmHg — ABNORMAL LOW (ref 44.0–60.0)
pCO2, Ven: 39.3 mmHg — ABNORMAL LOW (ref 44.0–60.0)
pH, Ven: 7.301 (ref 7.250–7.430)
pH, Ven: 7.311 (ref 7.250–7.430)
pH, Ven: 7.312 (ref 7.250–7.430)
pH, Ven: 7.393 (ref 7.250–7.430)
pH, Ven: 7.398 (ref 7.250–7.430)
pO2, Ven: 25 mmHg — CL (ref 32.0–45.0)
pO2, Ven: 32 mmHg (ref 32.0–45.0)
pO2, Ven: 32 mmHg (ref 32.0–45.0)
pO2, Ven: 36 mmHg (ref 32.0–45.0)
pO2, Ven: 37 mmHg (ref 32.0–45.0)

## 2021-07-12 LAB — BASIC METABOLIC PANEL
Anion gap: 10 (ref 5–15)
Anion gap: 10 (ref 5–15)
BUN: 15 mg/dL (ref 6–20)
BUN: 15 mg/dL (ref 6–20)
CO2: 23 mmol/L (ref 22–32)
CO2: 17 mmol/L — ABNORMAL LOW (ref 22–32)
Calcium: 9 mg/dL (ref 8.9–10.3)
Calcium: 8.3 mg/dL — ABNORMAL LOW (ref 8.9–10.3)
Chloride: 107 mmol/L (ref 98–111)
Chloride: 109 mmol/L (ref 98–111)
Creatinine, Ser: 1.27 mg/dL — ABNORMAL HIGH (ref 0.61–1.24)
Creatinine, Ser: 1.2 mg/dL (ref 0.61–1.24)
GFR, Estimated: >60 mL/min (ref >60)
GFR, Estimated: >60 mL/min (ref >60)
Glucose, Bld: 87 mg/dL (ref 70–99)
Glucose, Bld: 129 mg/dL — ABNORMAL HIGH (ref 70–99)
Potassium: 3.8 mmol/L (ref 3.5–5.1)
Potassium: 3.9 mmol/L (ref 3.5–5.1)
Sodium: 140 mmol/L (ref 135–145)
Sodium: 136 mmol/L (ref 135–145)

## 2021-07-12 LAB — LIPID PANEL
Cholesterol: 168 mg/dL (ref 0–200)
HDL: 34 mg/dL — ABNORMAL LOW (ref >40)
LDL Cholesterol: 118 mg/dL — ABNORMAL HIGH (ref 0–99)
Total CHOL/HDL Ratio: 4.9 RATIO
Triglycerides: 78 mg/dL (ref <150)
VLDL: 16 mg/dL (ref 0–40)

## 2021-07-12 LAB — ECHOCARDIOGRAM COMPLETE
AR max vel: 2.32 cm2
AV Area VTI: 2.06 cm2
AV Area mean vel: 2.14 cm2
AV Mean grad: 2 mmHg
AV Peak grad: 3.9 mmHg
Ao pk vel: 0.99 m/s
Area-P 1/2: 6.07 cm2
Calc EF: 19.6 %
Height: 71 in
MV M vel: 4.62 m/s
MV Peak grad: 85.4 mmHg
Radius: 0.8 cm
S' Lateral: 7.1 cm
Single Plane A2C EF: 19.8 %
Single Plane A4C EF: 21 %
Weight: 2993.6 oz

## 2021-07-12 LAB — POCT ACTIVATED CLOTTING TIME: Activated Clotting Time: 185 seconds

## 2021-07-12 LAB — HEMOGLOBIN AND HEMATOCRIT, BLOOD
HCT: 43.2 % (ref 39.0–52.0)
Hemoglobin: 14.4 g/dL (ref 13.0–17.0)

## 2021-07-12 LAB — RAPID URINE DRUG SCREEN, HOSP PERFORMED
Amphetamines: NOT DETECTED
Barbiturates: NOT DETECTED
Benzodiazepines: NOT DETECTED
Cocaine: NOT DETECTED
Opiates: NOT DETECTED
Tetrahydrocannabinol: POSITIVE — AB

## 2021-07-12 LAB — TROPONIN I (HIGH SENSITIVITY): Troponin I (High Sensitivity): 81 ng/L — ABNORMAL HIGH (ref <18)

## 2021-07-12 LAB — ABO/RH: ABO/RH(D): O POS

## 2021-07-12 LAB — MRSA NEXT GEN BY PCR, NASAL: MRSA by PCR Next Gen: NOT DETECTED

## 2021-07-12 LAB — SEDIMENTATION RATE: Sed Rate: 8 mm/hr (ref 0–16)

## 2021-07-12 LAB — HIV ANTIBODY (ROUTINE TESTING W REFLEX): HIV Screen 4th Generation wRfx: NONREACTIVE

## 2021-07-12 LAB — C-REACTIVE PROTEIN: CRP: 0.6 mg/dL (ref <1.0)

## 2021-07-12 SURGERY — RIGHT/LEFT HEART CATH AND CORONARY ANGIOGRAPHY
Anesthesia: LOCAL

## 2021-07-12 MED ORDER — POTASSIUM CHLORIDE 10 MEQ/100ML IV SOLN
INTRAVENOUS | Status: AC
  Filled 2021-07-12: qty 100

## 2021-07-12 MED ORDER — FENTANYL CITRATE PF 50 MCG/ML IJ SOSY
25.0000 ug | PREFILLED_SYRINGE | Freq: Once | INTRAMUSCULAR | Status: AC
  Administered 2021-07-12: 25 ug via INTRAVENOUS
  Filled 2021-07-12: qty 1

## 2021-07-12 MED ORDER — TRAMADOL HCL 50 MG PO TABS: 50.0000 mg | ORAL_TABLET | Freq: Four times a day (QID) | ORAL | Status: DC | PRN

## 2021-07-12 MED ORDER — ATROPINE SULFATE 1 MG/10ML IJ SOSY
PREFILLED_SYRINGE | INTRAMUSCULAR | Status: AC
  Filled 2021-07-12: qty 10

## 2021-07-12 MED ORDER — SODIUM CHLORIDE 0.9 % IV SOLN
INTRAVENOUS | Status: AC | PRN
  Administered 2021-07-12: 999 mL via INTRAVENOUS

## 2021-07-12 MED ORDER — DAPAGLIFLOZIN PROPANEDIOL 10 MG PO TABS
10.0000 mg | ORAL_TABLET | Freq: Every day | ORAL | Status: DC
  Filled 2021-07-12: qty 1

## 2021-07-12 MED ORDER — HYDRALAZINE HCL 20 MG/ML IJ SOLN: 10.0000 mg | INTRAMUSCULAR | Status: AC | PRN

## 2021-07-12 MED ORDER — MAGNESIUM SULFATE 50 % IJ SOLN
INTRAVENOUS | Status: DC | PRN
  Administered 2021-07-12: 2 g via INTRAVENOUS

## 2021-07-12 MED ORDER — FENTANYL CITRATE (PF) 100 MCG/2ML IJ SOLN
INTRAMUSCULAR | Status: AC
  Filled 2021-07-12: qty 2

## 2021-07-12 MED ORDER — LABETALOL HCL 5 MG/ML IV SOLN: 10.0000 mg | INTRAVENOUS | Status: AC | PRN

## 2021-07-12 MED ORDER — ONDANSETRON HCL 4 MG/2ML IJ SOLN
INTRAMUSCULAR | Status: AC
  Filled 2021-07-12: qty 2

## 2021-07-12 MED ORDER — MAGNESIUM SULFATE 50 % IJ SOLN
INTRAMUSCULAR | Status: AC
  Filled 2021-07-12: qty 4

## 2021-07-12 MED ORDER — SODIUM CHLORIDE 0.9% FLUSH: 3.0000 mL | INTRAVENOUS | Status: DC | PRN

## 2021-07-12 MED ORDER — SODIUM CHLORIDE 0.9 % IV SOLN: INTRAVENOUS | Status: DC

## 2021-07-12 MED ORDER — IOHEXOL 350 MG/ML SOLN
INTRAVENOUS | Status: DC | PRN
  Administered 2021-07-12: 65 mL

## 2021-07-12 MED ORDER — HEPARIN SODIUM (PORCINE) 1000 UNIT/ML IJ SOLN
INTRAMUSCULAR | Status: DC | PRN
  Administered 2021-07-12: 4000 [IU] via INTRAVENOUS
  Administered 2021-07-12: 3000 [IU] via INTRAVENOUS

## 2021-07-12 MED ORDER — ONDANSETRON HCL 4 MG/2ML IJ SOLN
INTRAMUSCULAR | Status: DC | PRN
  Administered 2021-07-12: 4 mg via INTRAVENOUS

## 2021-07-12 MED ORDER — NOREPINEPHRINE BITARTRATE 1 MG/ML IV SOLN
INTRAVENOUS | Status: AC | PRN
  Administered 2021-07-12: 20 ug/min via INTRAVENOUS

## 2021-07-12 MED ORDER — POTASSIUM CHLORIDE 10 MEQ/100ML IV SOLN
INTRAVENOUS | Status: AC | PRN
  Administered 2021-07-12 (×2): 10 meq via INTRAVENOUS

## 2021-07-12 MED ORDER — SODIUM CHLORIDE 0.9 % IV SOLN: 250.0000 mL | INTRAVENOUS | Status: DC | PRN

## 2021-07-12 MED ORDER — HEPARIN SODIUM (PORCINE) 5000 UNIT/ML IJ SOLN
INTRAMUSCULAR | Status: DC
  Filled 2021-07-12: qty 10

## 2021-07-12 MED ORDER — LIDOCAINE HCL (PF) 1 % IJ SOLN
INTRAMUSCULAR | Status: AC
  Filled 2021-07-12: qty 30

## 2021-07-12 MED ORDER — EPINEPHRINE 1 MG/10ML IJ SOSY
PREFILLED_SYRINGE | INTRAMUSCULAR | Status: AC
  Filled 2021-07-12: qty 40

## 2021-07-12 MED ORDER — FENTANYL CITRATE (PF) 100 MCG/2ML IJ SOLN
INTRAMUSCULAR | Status: DC | PRN
  Administered 2021-07-12: 25 ug via INTRAVENOUS

## 2021-07-12 MED ORDER — CHLORHEXIDINE GLUCONATE CLOTH 2 % EX PADS
6.0000 | MEDICATED_PAD | Freq: Every day | CUTANEOUS | Status: DC
  Administered 2021-07-12 – 2021-07-16 (×5): 6 via TOPICAL

## 2021-07-12 MED ORDER — CARVEDILOL 3.125 MG PO TABS
3.1250 mg | ORAL_TABLET | Freq: Two times a day (BID) | ORAL | Status: DC
Start: 2021-07-12 — End: 2021-07-12
  Administered 2021-07-12: 3.125 mg via ORAL
  Filled 2021-07-12: qty 1

## 2021-07-12 MED ORDER — NOREPINEPHRINE 4 MG/250ML-% IV SOLN
0.0000 ug/min | INTRAVENOUS | Status: DC
  Administered 2021-07-12: 15 ug/min via INTRAVENOUS
  Administered 2021-07-12: 10 ug/min via INTRAVENOUS
  Administered 2021-07-13: 4 ug/min via INTRAVENOUS
  Filled 2021-07-12 (×2): qty 250

## 2021-07-12 MED ORDER — HEPARIN (PORCINE) IN NACL 1000-0.9 UT/500ML-% IV SOLN
INTRAVENOUS | Status: DC | PRN
  Administered 2021-07-12 (×2): 500 mL

## 2021-07-12 MED ORDER — HEPARIN (PORCINE) IN NACL 1000-0.9 UT/500ML-% IV SOLN
INTRAVENOUS | Status: AC
  Filled 2021-07-12: qty 1000

## 2021-07-12 MED ORDER — SODIUM CHLORIDE 0.9% FLUSH
3.0000 mL | Freq: Two times a day (BID) | INTRAVENOUS | Status: DC
  Administered 2021-07-12: 3 mL via INTRAVENOUS

## 2021-07-12 MED ORDER — DIGOXIN 125 MCG PO TABS
0.1250 mg | ORAL_TABLET | Freq: Every day | ORAL | Status: DC
  Administered 2021-07-12 – 2021-07-16 (×5): 0.125 mg via ORAL
  Filled 2021-07-12 (×5): qty 1

## 2021-07-12 MED ORDER — MIDAZOLAM HCL 2 MG/2ML IJ SOLN
INTRAMUSCULAR | Status: DC | PRN
  Administered 2021-07-12: 1 mg via INTRAVENOUS

## 2021-07-12 MED ORDER — EPINEPHRINE PF 1 MG/ML IJ SOLN
INTRAMUSCULAR | Status: DC | PRN
  Administered 2021-07-12: 1 mg
  Administered 2021-07-12: .5 mg
  Administered 2021-07-12 (×2): 1 mg
  Administered 2021-07-12: .5 mg

## 2021-07-12 MED ORDER — SPIRONOLACTONE 12.5 MG HALF TABLET
12.5000 mg | ORAL_TABLET | Freq: Every day | ORAL | Status: DC
  Filled 2021-07-12: qty 1

## 2021-07-12 MED ORDER — LIDOCAINE HCL (PF) 1 % IJ SOLN
INTRAMUSCULAR | Status: DC | PRN
  Administered 2021-07-12 (×2): 2 mL
  Administered 2021-07-12: 5 mL

## 2021-07-12 MED ORDER — NOREPINEPHRINE 4 MG/250ML-% IV SOLN
INTRAVENOUS | Status: AC
  Filled 2021-07-12: qty 250

## 2021-07-12 MED ORDER — ENOXAPARIN SODIUM 40 MG/0.4ML IJ SOSY
40.0000 mg | PREFILLED_SYRINGE | INTRAMUSCULAR | Status: DC
  Administered 2021-07-13 – 2021-07-16 (×4): 40 mg via SUBCUTANEOUS
  Filled 2021-07-12 (×4): qty 0.4

## 2021-07-12 MED ORDER — SODIUM CHLORIDE 0.9% FLUSH: 3.0000 mL | Freq: Two times a day (BID) | INTRAVENOUS | Status: DC

## 2021-07-12 MED ORDER — VERAPAMIL HCL 2.5 MG/ML IV SOLN
INTRAVENOUS | Status: DC | PRN
  Administered 2021-07-12: 10 mL via INTRA_ARTERIAL

## 2021-07-12 MED ORDER — ONDANSETRON HCL 4 MG/2ML IJ SOLN: 4.0000 mg | Freq: Four times a day (QID) | INTRAMUSCULAR | Status: DC | PRN

## 2021-07-12 MED ORDER — ACETAMINOPHEN 325 MG PO TABS
650.0000 mg | ORAL_TABLET | ORAL | Status: DC | PRN
  Administered 2021-07-13 – 2021-07-16 (×2): 650 mg via ORAL
  Filled 2021-07-12 (×3): qty 2

## 2021-07-12 MED ORDER — HEPARIN SODIUM (PORCINE) 1000 UNIT/ML IJ SOLN
INTRAMUSCULAR | Status: AC
  Filled 2021-07-12: qty 1

## 2021-07-12 MED ORDER — VERAPAMIL HCL 2.5 MG/ML IV SOLN
INTRAVENOUS | Status: AC
  Filled 2021-07-12: qty 2

## 2021-07-12 MED ORDER — ASPIRIN 81 MG PO CHEW
81.0000 mg | CHEWABLE_TABLET | ORAL | Status: AC
  Administered 2021-07-12: 81 mg via ORAL
  Filled 2021-07-12: qty 1

## 2021-07-12 MED ORDER — MIDAZOLAM HCL 2 MG/2ML IJ SOLN
INTRAMUSCULAR | Status: AC
  Filled 2021-07-12: qty 2

## 2021-07-12 MED ORDER — PERFLUTREN LIPID MICROSPHERE
1.0000 mL | INTRAVENOUS | Status: AC | PRN
Start: 2021-07-12 — End: 2021-07-12
  Administered 2021-07-12: 2 mL via INTRAVENOUS
  Filled 2021-07-12: qty 10

## 2021-07-12 SURGICAL SUPPLY — 18 items
CATH 5FR JL3.5 JR4 ANG PIG MP (CATHETERS) ×3 IMPLANT
CATH BALLN WEDGE 5F 110CM (CATHETERS) ×3 IMPLANT
DEVICE RAD COMP TR BAND LRG (VASCULAR PRODUCTS) ×3 IMPLANT
FEM STOP ARCH (HEMOSTASIS) ×1
GLIDESHEATH SLEND SS 6F .021 (SHEATH) ×3 IMPLANT
GUIDEWIRE INQWIRE 1.5J.035X260 (WIRE) ×2 IMPLANT
INQWIRE 1.5J .035X260CM (WIRE) ×3
KIT MICROPUNCTURE NIT STIFF (SHEATH) ×3 IMPLANT
LINE ARTERIAL PRESSURE 36 MF (TUBING) ×3 IMPLANT
PACK CARDIAC CATHETERIZATION (CUSTOM PROCEDURE TRAY) ×3 IMPLANT
SET IMPELLA CP PUMP (CATHETERS) ×3 IMPLANT
SHEATH GLIDE SLENDER 4/5FR (SHEATH) ×3 IMPLANT
SHEATH PINNACLE 6F 10CM (SHEATH) ×3 IMPLANT
SYSTEM COMPRESSION FEMOSTOP (HEMOSTASIS) ×2 IMPLANT
TRANSDUCER W/STOPCOCK (MISCELLANEOUS) ×3 IMPLANT
TRAY CATH 3LUMEN 20C SULFAFREE (CATHETERS) ×3 IMPLANT
WIRE EMERALD 3MM-J .035X150CM (WIRE) ×3 IMPLANT
WIRE MICROINTRODUCER 60CM (WIRE) ×3 IMPLANT

## 2021-07-12 NOTE — H&P (View-Only) (Signed)
Cardiology Progress Note  Patient ID: Thomas Wood MRN: [id number] DOB: May 30, 1996 Date of Encounter: 07/12/2021  Primary Cardiologist: Sanda Klein, MD  Subjective   Chief Complaint: SOB  HPI: Reports shortness of breath has improved with diuresis.  Informs me that his father had a heart transplant in his 77s.  Apparently this was due to multiple heart attacks.  No other family members have history of congestive heart failure.  Euvolemic this morning.    Echocardiogram performed at the time my examination shows severely dilated left ventricle 7.6 cm with EF 20-25%.  He demonstrates global hypokinesis with mild to moderate RV dysfunction.  ROS:  All other ROS reviewed and negative. Pertinent positives noted in the HPI.     Inpatient Medications  Scheduled Meds:  carvedilol  3.125 mg Oral BID WC   heparin  5,000 Units Subcutaneous Q8H   influenza vac split quadrivalent PF  0.5 mL Intramuscular Tomorrow-1000   sodium chloride flush  3 mL Intravenous Q12H   spironolactone  12.5 mg Oral Daily   Continuous Infusions:  sodium chloride     PRN Meds: sodium chloride, acetaminophen, albuterol, ondansetron (ZOFRAN) IV, sodium chloride flush   Vital Signs   Vitals:   07/11/21 2006 07/11/21 2344 07/12/21 0400 07/12/21 0742  BP: 115/88 100/69 112/78   Pulse: (!) 107 76 86 98  Resp: 16 18 20 18   Temp: 98.6 F (37 C) 98.6 F (37 C) 97.7 F (36.5 C) 98 F (36.7 C)  TempSrc: Oral Oral Oral Oral  SpO2: 100% 99% 96%   Weight:   84.9 kg   Height:        Intake/Output Summary (Last 24 hours) at 07/12/2021 0749 Last data filed at 07/12/2021 0723 Gross per 24 hour  Intake 0 ml  Output 1000 ml  Net -1000 ml   Last 3 Weights 07/12/2021 07/11/2021 07/11/2021  Weight (lbs) 187 lb 1.6 oz 197 lb 197 lb  Weight (kg) 84.868 kg 89.359 kg 89.359 kg      Telemetry  Overnight telemetry shows sinus rhythm 60s, PVCs noted, which I personally reviewed.   ECG  The most recent ECG  shows sinus rhythm heart 78, biatrial enlargement, LVH with PVCs, which I personally reviewed.   Physical Exam   Vitals:   07/11/21 2006 07/11/21 2344 07/12/21 0400 07/12/21 0742  BP: 115/88 100/69 112/78   Pulse: (!) 107 76 86 98  Resp: 16 18 20 18   Temp: 98.6 F (37 C) 98.6 F (37 C) 97.7 F (36.5 C) 98 F (36.7 C)  TempSrc: Oral Oral Oral Oral  SpO2: 100% 99% 96%   Weight:   84.9 kg   Height:        Intake/Output Summary (Last 24 hours) at 07/12/2021 0749 Last data filed at 07/12/2021 0723 Gross per 24 hour  Intake 0 ml  Output 1000 ml  Net -1000 ml    Last 3 Weights 07/12/2021 07/11/2021 07/11/2021  Weight (lbs) 187 lb 1.6 oz 197 lb 197 lb  Weight (kg) 84.868 kg 89.359 kg 89.359 kg    Body mass index is 26.1 kg/m.   General: Well nourished, well developed, in no acute distress Head: Atraumatic, normal size  Eyes: PEERLA, EOMI  Neck: Supple, no JVD Endocrine: No thryomegaly Cardiac: Normal S1, S2; RRR; no murmurs, rubs, or gallops Lungs: Clear to auscultation bilaterally, no wheezing, rhonchi or rales  Abd: Soft, nontender, no hepatomegaly  Ext: No edema, pulses 2+ Musculoskeletal: No deformities, BUE and BLE strength normal  and equal Skin: Warm and dry, no rashes   Neuro: Alert and oriented to person, place, time, and situation, CNII-XII grossly intact, no focal deficits  Psych: Normal mood and affect   Labs  High Sensitivity Troponin:  No results for input(s): TROPONINIHS in the last 720 hours.   Cardiac EnzymesNo results for input(s): TROPONINI in the last 168 hours. No results for input(s): TROPIPOC in the last 168 hours.  Chemistry Recent Labs  Lab 07/11/21 1401 07/11/21 2035 07/12/21 0425  NA 141 139 140  K 4.1 3.9 3.8  CL 108 105 107  CO2 24 23 23   GLUCOSE 98 95 87  BUN 19 15 15   CREATININE 1.32* 1.40* 1.27*  CALCIUM 9.4 9.6 9.0  PROT  --  7.3  --   ALBUMIN  --  4.3  --   AST  --  38  --   ALT  --  48*  --   ALKPHOS  --  56  --   BILITOT   --  3.3*  --   GFRNONAA >60 >60 >60  ANIONGAP 9 11 10     Hematology Recent Labs  Lab 07/11/21 1401 07/11/21 2035  WBC 8.5 9.1  RBC 5.29 5.41  HGB 15.7 15.7  HCT 48.1 48.5  MCV 90.9 89.6  MCH 29.7 29.0  MCHC 32.6 32.4  RDW 12.9 12.8  PLT 252 273   BNP Recent Labs  Lab 07/11/21 1401  BNP 1,884.0*    DDimer No results for input(s): DDIMER in the last 168 hours.   Radiology  DG Chest 2 View  Result Date: 07/11/2021 CLINICAL DATA:  Shortness of breath for 2 days. Nonproductive cough. Former smoker. EXAM: CHEST - 2 VIEW COMPARISON:  None. FINDINGS: The cardiac silhouette is mildly enlarged. There is peribronchial thickening bilaterally. No confluent airspace opacity, overt pulmonary edema, pleural effusion, or pneumothorax is identified. No acute osseous abnormality is seen. IMPRESSION: Bronchitic changes and mild cardiomegaly. Electronically Signed   By: Logan Bores M.D.   On: 07/11/2021 13:22    Cardiac Studies  Echo my review EF 20 to 25%, severe dilation of left ventricle 7.6 cm, mild to moderate RV dysfunction  Patient Profile  [name] is a 25 y.o. male with asthma and childhood obesity who was admitted on 07/11/2021 with new onset systolic heart failure.  Assessment & Plan   #New onset systolic heart failure, EF 20-25% -Admitted with shortness of breath over the past few weeks.  Diuresed 1 L with improvement in shortness of breath. -Formal echo is pending but on my review shows EF 20 to 25% with global hypokinesis.  LV is severely dilated.  Mild to moderate RV dysfunction. -Cardiac exam has no murmurs. -He has hemodynamically stable.  No signs of cardiogenic shock. -TSH is normal.  HIV is negative.  -I have ordered UDS. -Inflammatory markers are negative.  He denies any drug use.  No recent infection. -He informed that his father had a myocardial infarction in his 72s.  This ultimately resulted in heart transplantation.  No history of multiple family  members with heart failure. -Unclear etiology of his LV dysfunction. -He is hemodynamically stable.  We will start Coreg 3.125 mg twice daily, Aldactone 12.5 mg daily, Farxiga 10 mg daily.  Plan to start Entresto 24-26 mg twice daily tomorrow. -We will proceed with left and right heart catheterization this morning.  Explained risk and benefits.  He is willing to proceed.  We need to exclude coronary disease given his  father's history of premature CAD. -We will check high-sensitivity troponin.  These were not checked in the emergency room. -Discussed his case with Dr. Arvilla Meres.  I believe he would benefit from left and right heart catheterization with the advanced heart failure team.  Suspect he will ultimately need a cardiac MRI this admission as well.  Given severe LV dysfunction and severely dilated LV I believe he will also need their consultation.  Shared Decision Making/Informed Consent The risks [stroke (1 in 1000), death (1 in 1000), kidney failure [usually temporary] (1 in 500), bleeding (1 in 200), allergic reaction [possibly serious] (1 in 200)], benefits (diagnostic support and management of coronary artery disease) and alternatives of a cardiac catheterization were discussed in detail with Mr. Kretschmer and he is willing to proceed.  For questions or updates, please contact [facility] Please consult [email or web address] for contact info under   Time Spent with Patient: I have spent a total of 35 minutes with patient reviewing hospital notes, telemetry, EKGs, labs and examining the patient as well as establishing an assessment and plan that was discussed with the patient.  > 50% of time was spent in direct patient care.    Signed, Lenna Gilford. Flora Lipps, MD, [facility]  [facility]  07/12/2021 7:49 AM

## 2021-07-12 NOTE — TOC Benefit Eligibility Note (Signed)
Patient Product/process development scientist completed.    The patient is currently admitted and upon discharge could be taking Jardiance 10 mg.  The current 30 day co-pay is, $34.99 [employer], the mfr of JARDIANCE 10 MG TABLET paid 40.01 toward plan copay.   The patient is currently admitted and upon discharge could be taking Farxiga 10 mg.  Non Formulary  The patient is insured through [employer]     Roland Earl, CPhT Pharmacy Patient Advocate Specialist [facility] Patient Advocate Team Direct Number: [phone]  Fax: [phone]

## 2021-07-12 NOTE — Progress Notes (Signed)
Heart Failure Nurse Navigator Progress Note  Attempted to screen for HV TOC readiness. Pt leaving for heart cath, visitor following. Introduced self, will plan interview completion later today.   Ozella Rocks, MSN, RN Heart Failure Nurse Navigator [phone]

## 2021-07-12 NOTE — Progress Notes (Signed)
  Came to see patient post-cath. Family at bedside  Remains on NE for BP and pressor support.   SBP 90-100. Feels weak. Denies SOB. Having pain at Impella site.   Groin site stable with fem stop in place. R DP pulse 2+  Extremities well perfused. Labs stable.   Long d/w patient and his family about situation and possible need for transplant w/u sooner rather than later.   Continue to wean NE as tolerated. F/u co-ox & CVP. Plan MRI tomorrow.   Additional CCT 40 mins.    Arvilla Meres, MD  7:41 PM

## 2021-07-12 NOTE — Consult Note (Addendum)
NAME:  Thomas Wood, MRN:  [id number], DOB:  29-Jul-1996, LOS: 1 ADMISSION DATE:  07/11/2021, CONSULTATION DATE:  07/12/2021 REFERRING MD:  Dr. Gala Romney, CHIEF COMPLAINT:  SOB   History of Present Illness:   25 year old male with prior history of former smoker, childhood asthma, and childhood obesity who initially presented to [facility] with progressive exertional shortness of breath for two weeks.    He does not see a PCP regularly.  Reports no issues from his childhood asthma and rarely uses his inhaler.  Was active and running at the beginning of the year, able to run 2 miles but stopped given job demands and working 12hr shifts.  Also reports his weight was 215 lbs at the beginning of the year and since is now down to 187 lbs without trying, exercise, and despite good appetite.  He currently works as a Geologist, engineering, mostly standing for the majority of his shift.  He formerly smoked cigarettes, 1 pack lasting about every 3 days, but quit in 2018; reports he smoked for 6 to 7 years.  Occasionally smokes THC and occasionally drinks ETOH, mostly socially.  Denied any recent URI, viral illness, fever, or extremity swelling.  He states thinking back, he probably has felt palpitations for months, but didn't think anything of it.  Most recent symptoms started almost 2 weeks ago with chest tightness and shortness of breath to where he took several days off work.  Noticed it at rest and with exertion.  Associated also with some nausea, orthopnea, and PND.  He returned to work yesterday and became severely symptomatic after walking up a flight of stairs.  He was told by the occupational nurse his heart didn't sound right and that he should get it checked.   Of note, his father has significant cardiac history of having several MI's in his 68's with resultant HF requiring ICD and LVAD then was eventually transplanted in his 80's at [facility]; patient states his father was told it wasn't genetic.  No  other significant family cardiac history.   He presented to [facility] ER for complaints.  Workup notable for CXR showing mild cardiomegaly and bronchitic changes, EKG with NSR with PVCs, LAD, and biatrial enlargement, BNP 1884, sCr 1.32, Mag 1.8, K 4.1, ALT 48, t.bili 3.3, and UDS + for THC.  He was treated with lasix in ER with symptomatic improvement with diuresis ~1L.  He was transferred to [facility] and admitted by Cardiology and hemodynamically stable at that time.  TTE today revealed LVEF < 20%, severe MR, and biventricular failure.  Troponin hs noted at 81.  Heart failure team consulted and he was taken for a diagnostic right and left heart catheterization which showed clean coronaries, however near the end of the procedure, suspected patient had vagal episode requiring epi, and eventually norepinephrine drip.  A left IJ CVL and left radial aline were placed.  He was also given magnesium, potassium, and zofan.  An Impella was considered but held off for now.  He is to return to CVICU, PCCM consulted to assist in medical management.    Pertinent  Medical History  Former smoker, childhood asthma, childhood obesity, nephrolithiasis   - family hx of father having several Mis in the 30s with HF, ICD, LVAD and eventually a heart transplant in his 68's at [facility] Events: Including procedures, antibiotic start and stop dates in addition to other pertinent events   11/10 admitted to cardiology with new dx HF  11/11 TTE with biventricular HF and severe MR, HF consult, diagnostic R/LHC, L IJ/ R radial aline, PCCM consult   07/12/21 TTE > TTE today revealed LVEF < 20% with global hypokinesis, severely dilated LV, G2DD, severely reduced RV, RV moderately enlarged, mildly elevated PASP 38.5, LA severely dilated, RA moderately dilated, severe MR, mild to moderate TR  07/13/11 R/LHC >  Interim History / Subjective:   Weaning NE, down to 10 mcg/hr C/o of current low back pain from laying on cath  table  Objective   Blood pressure 103/63, pulse (!) 47, temperature 98.3 F (36.8 C), temperature source Oral, resp. rate 14, height $RemoveBe'5\' 11"'CmyDRFDan$  (1.803 m), weight 84.9 kg, SpO2 94 %. CVP:  [6 mmHg-8 mmHg] 8 mmHg      Intake/Output Summary (Last 24 hours) at 07/12/2021 1736 Last data filed at 07/12/2021 1700 Gross per 24 hour  Intake 227.09 ml  Output 1600 ml  Net -1372.91 ml   Filed Weights   07/11/21 1253 07/12/21 0400 07/12/21 0854  Weight: 89.4 kg 84.9 kg 84.9 kg   Examination: General:  Pleasant young adult male lying in bed in NAD HEENT: MM pale/moist, L IJ CVL Neuro:  alert, oriented, MAE- currently limited ROM given given femstop CV: NSR with frequent multifocal PVCs, right radial TR band, right femoral femstop device in place, distal dp intact PULM:  non labored, on room air, CTA, no wheezes GI: soft, bs+, NT Extremities: slightly cool/dry, no LE edema  Skin: no rashes   Resolved Hospital Problem list    Assessment & Plan:   Biventricular HF, new onset, 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 Cardiogenic shock- after vagal/ bradycardic episode  - s/p diagnostic R/LHC on 11/11 - CRP 0.6, ESR 8, TSH 4.292>6.062, HIV neg, UDS +THC - Concern for familial CM vs possible tachy mediated CM with frequent multifocal PVCs, no prior cardiac workup - no recent URI/ fever, UDS +THC Plan - Per HF team/ cardiology  - L IJ CVL and right radial aline placed in cath lab - trend co-ox - continue NE for MAP > 65 - goal K > 4, Mag > 2 - appears euvolemic at this time - plan to hold coreg, spiro, farxiga while on vasopressors, defer to cards  - TR band and right femstop per post cath orderset - c/o of lower back pain, will check Hgb just for completeness, r/o concern for RPB  Elevated sCr- unclear if new or chronic, no prior labs to compare.  - sCr 1.32 > 1.4  > 1.27 - strict I/Os - trend renal indices - avoid nephrotoxins - if worsening, will need to rule out obstructive pathology given hx of  nephrolithiasis   Hypokalemia - received several runs in cath lab - recheck BMET now - mag in am, goals as above   Elevated t.bili/ ALT - recheck LFTs in am   Best Practice (right click and "Reselect all SmartList Selections" daily)   Diet/type: NPO DVT prophylaxis: SCD GI prophylaxis: N/A Lines: Central line Foley:  N/A Code Status:  full code Last date of multidisciplinary goals of care discussion [per primary team] Mother and girlfriend at bedside  Labs   CBC: Recent Labs  Lab 07/11/21 1401 07/11/21 2035 07/12/21 1330 07/12/21 1346 07/12/21 1411 07/12/21 1412 07/12/21 1421 07/12/21 1422  WBC 8.5 9.1  --   --   --   --   --   --   NEUTROABS 5.8 6.5  --   --   --   --   --   --  HGB 15.7 15.7   < > 13.9 13.9 14.6 14.3 16.0  HCT 48.1 48.5   < > 41.0 41.0 43.0 42.0 47.0  MCV 90.9 89.6  --   --   --   --   --   --   PLT 252 273  --   --   --   --   --   --    < > = values in this interval not displayed.    Basic Metabolic Panel: Recent Labs  Lab 07/11/21 1401 07/11/21 2035 07/12/21 0425 07/12/21 1330 07/12/21 1346 07/12/21 1411 07/12/21 1412 07/12/21 1421 07/12/21 1422  NA 141 139 140   < > 147* 150* 149* 148* 144  K 4.1 3.9 3.8   < > 3.3* 2.6* 2.8* 2.4* 2.8*  CL 108 105 107  --   --   --   --   --   --   CO2 $Re'24 23 23  'Wzb$ --   --   --   --   --   --   GLUCOSE 98 95 87  --   --   --   --   --   --   BUN $Re'19 15 15  'IEt$ --   --   --   --   --   --   CREATININE 1.32* 1.40* 1.27*  --   --   --   --   --   --   CALCIUM 9.4 9.6 9.0  --   --   --   --   --   --   MG 1.8 1.9  --   --   --   --   --   --   --    < > = values in this interval not displayed.   GFR: Estimated Creatinine Clearance: 95.5 mL/min (A) (by C-G formula based on SCr of 1.27 mg/dL (H)). Recent Labs  Lab 07/11/21 1401 07/11/21 2035  WBC 8.5 9.1    Liver Function Tests: Recent Labs  Lab 07/11/21 2035  AST 38  ALT 48*  ALKPHOS 56  BILITOT 3.3*  PROT 7.3  ALBUMIN 4.3   No results for  input(s): LIPASE, AMYLASE in the last 168 hours. No results for input(s): AMMONIA in the last 168 hours.  ABG    Component Value Date/Time   PHART 7.341 (L) 07/12/2021 1412   PCO2ART 25.1 (L) 07/12/2021 1412   PO2ART 65 (L) 07/12/2021 1412   HCO3 19.9 (L) 07/12/2021 1422   TCO2 21 (L) 07/12/2021 1422   ACIDBASEDEF 6.0 (H) 07/12/2021 1422   O2SAT 60.8 07/12/2021 1715     Coagulation Profile: No results for input(s): INR, PROTIME in the last 168 hours.  Cardiac Enzymes: No results for input(s): CKTOTAL, CKMB, CKMBINDEX, TROPONINI in the last 168 hours.  HbA1C: No results found for: HGBA1C  CBG: No results for input(s): GLUCAP in the last 168 hours.  Review of Systems:   Review of Systems  Constitutional:  Positive for weight loss. Negative for chills and fever.  HENT:  Negative for congestion, sinus pain and sore throat.   Respiratory:  Negative for cough, hemoptysis, sputum production and shortness of breath.   Cardiovascular:  Positive for chest pain, palpitations, orthopnea and PND. Negative for claudication and leg swelling.  Gastrointestinal:  Positive for nausea. Negative for abdominal pain and vomiting.  Musculoskeletal:  Positive for back pain.  Neurological:  Negative for focal weakness and weakness.   Past Medical History:  He,  has a past medical history of Asthma, Chronic kidney disease, GERD (gastroesophageal reflux disease), and New onset of congestive heart failure ([place]) (07/11/2021).   Surgical History:  History reviewed. No pertinent surgical history.   Social History:  Former smoker, quit 2018 Occasional marijuana and ETOH use (mostly social, never heavy)  Family History:  His family history includes Asthma in his brother and mother; Cancer in his mother; Diabetes in his father; Heart disease (age of onset: 59) in his father; Heart failure in his father; Kidney disease in his mother; Miscarriages / [place] in his mother.   Allergies No Known  Allergies   Home Medications  Prior to Admission medications   Medication Sig Start Date End Date Taking? Authorizing Provider  acetaminophen (TYLENOL) 500 MG tablet Take 1,000 mg by mouth every 6 (six) hours as needed for moderate pain.   Yes [provider]  albuterol (VENTOLIN HFA) 108 (90 Base) MCG/ACT inhaler Inhale 2 puffs into the lungs every 6 (six) hours as needed for wheezing. 07/11/21  Yes [provider]  cetirizine (ZYRTEC) 10 MG tablet Take 10 mg by mouth daily as needed.   Yes [provider]  aluminum chloride (DRYSOL) 20 % external solution Apply topically at bedtime. Patient not taking: No sig reported 09/18/17   Raylene Everts, MD     Critical care time: 65 mins     Kennieth Rad, ACNP [facility] 07/12/2021, 5:38 PM  See Amion for pager If no response to pager, please call PCCM consult pager After 7:00 pm call Elink     Pulmonary critical care:  25 year old gentleman former smoker, childhood asthma.  Presented with shortness of breath on exertion, weight gain.  Taken to the Cath Lab today for evaluation with echo due to biventricular failure.  Of note his father had a significant cardiac history several In his 58s had heart failure and LVAD was transplanted in his 61s.  Today in the Cath Lab had a vagal type event, was in shock placed on vasopressors and transferred to the ICU.  Pulmonary critical care care was consulted for recommendations.  BP 103/63   Pulse 90   Temp 98.3 F (36.8 C) (Oral)   Resp 18   Ht $R'5\' 11"'Yz$  (1.803 m)   Wt 84.9 kg   SpO2 95%   BMI 26.10 kg/m   General: Young male resting comfortably in bed, on vasopressors HEENT: Tracking appropriately Heart: Regular rate rhythm S1-S2 Lungs: No crackles in the base Labs: Reviewed  Assessment: Cardiogenic shock Biventricular failure AKI, likely secondary to above Severe MR PVCs  Plan: Norepinephrine continued, wean to maintain mean  arterial pressure greater than 65. Goal-directed heart failure regimen per cardiology service. Check coox  Repeat labs Check electrolytes  This patient is critically ill with multiple organ system failure; which, requires frequent high complexity decision making, assessment, support, evaluation, and titration of therapies. This was completed through the application of advanced monitoring technologies and extensive interpretation of multiple databases. During this encounter critical care time was devoted to patient care services described in this note for 48 minutes.  [facility] 07/12/2021 6:01 PM

## 2021-07-12 NOTE — Progress Notes (Signed)
14 french sheath aspirated and removed from RFA. Manual pressure applied for 30 minutes. Site rebled, additional pressure held for 10 minutes more, then femostop applied at 65mm/hg.  Site level 0 , femostop in place at 16:05 @75mm /hg.  Bilateral dp pulses present , bedrest instructions given.

## 2021-07-12 NOTE — Interval H&P Note (Signed)
History and Physical Interval Note:  07/12/2021 1:04 PM  Thomas Wood  has presented today for surgery, with the diagnosis of chest pain.  The various methods of treatment have been discussed with the patient and family. After consideration of risks, benefits and other options for treatment, the patient has consented to  Procedure(s): RIGHT/LEFT HEART CATH AND CORONARY ANGIOGRAPHY (N/A) and possible coronary angioplasty as a surgical intervention.  The patient's history has been reviewed, patient examined, no change in status, stable for surgery.  I have reviewed the patient's chart and labs.  Questions were answered to the patient's satisfaction.     Bethzaida Boord

## 2021-07-12 NOTE — Progress Notes (Signed)
Cardiology Progress Note  Patient ID: Thomas Wood MRN: [id number] DOB: Dec 20, 1995 Date of Encounter: 07/12/2021  Primary Cardiologist: Sanda Klein, MD  Subjective   Chief Complaint: SOB  HPI: Reports shortness of breath has improved with diuresis.  Informs me that his father had a heart transplant in his 25s.  Apparently this was due to multiple heart attacks.  No other family members have history of congestive heart failure.  Euvolemic this morning.    Echocardiogram performed at the time my examination shows severely dilated left ventricle 7.6 cm with EF 20-25%.  He demonstrates global hypokinesis with mild to moderate RV dysfunction.  ROS:  All other ROS reviewed and negative. Pertinent positives noted in the HPI.     Inpatient Medications  Scheduled Meds:  carvedilol  3.125 mg Oral BID WC   heparin  5,000 Units Subcutaneous Q8H   influenza vac split quadrivalent PF  0.5 mL Intramuscular Tomorrow-1000   sodium chloride flush  3 mL Intravenous Q12H   spironolactone  12.5 mg Oral Daily   Continuous Infusions:  sodium chloride     PRN Meds: sodium chloride, acetaminophen, albuterol, ondansetron (ZOFRAN) IV, sodium chloride flush   Vital Signs   Vitals:   07/11/21 2006 07/11/21 2344 07/12/21 0400 07/12/21 0742  BP: 115/88 100/69 112/78   Pulse: (!) 107 76 86 98  Resp: 16 18 20 18   Temp: 98.6 F (37 C) 98.6 F (37 C) 97.7 F (36.5 C) 98 F (36.7 C)  TempSrc: Oral Oral Oral Oral  SpO2: 100% 99% 96%   Weight:   84.9 kg   Height:        Intake/Output Summary (Last 24 hours) at 07/12/2021 0749 Last data filed at 07/12/2021 0723 Gross per 24 hour  Intake 0 ml  Output 1000 ml  Net -1000 ml   Last 3 Weights 07/12/2021 07/11/2021 07/11/2021  Weight (lbs) 187 lb 1.6 oz 197 lb 197 lb  Weight (kg) 84.868 kg 89.359 kg 89.359 kg      Telemetry  Overnight telemetry shows sinus rhythm 60s, PVCs noted, which I personally reviewed.   ECG  The most recent ECG  shows sinus rhythm heart 78, biatrial enlargement, LVH with PVCs, which I personally reviewed.   Physical Exam   Vitals:   07/11/21 2006 07/11/21 2344 07/12/21 0400 07/12/21 0742  BP: 115/88 100/69 112/78   Pulse: (!) 107 76 86 98  Resp: 16 18 20 18   Temp: 98.6 F (37 C) 98.6 F (37 C) 97.7 F (36.5 C) 98 F (36.7 C)  TempSrc: Oral Oral Oral Oral  SpO2: 100% 99% 96%   Weight:   84.9 kg   Height:        Intake/Output Summary (Last 24 hours) at 07/12/2021 0749 Last data filed at 07/12/2021 0723 Gross per 24 hour  Intake 0 ml  Output 1000 ml  Net -1000 ml    Last 3 Weights 07/12/2021 07/11/2021 07/11/2021  Weight (lbs) 187 lb 1.6 oz 197 lb 197 lb  Weight (kg) 84.868 kg 89.359 kg 89.359 kg    Body mass index is 26.1 kg/m.   General: Well nourished, well developed, in no acute distress Head: Atraumatic, normal size  Eyes: PEERLA, EOMI  Neck: Supple, no JVD Endocrine: No thryomegaly Cardiac: Normal S1, S2; RRR; no murmurs, rubs, or gallops Lungs: Clear to auscultation bilaterally, no wheezing, rhonchi or rales  Abd: Soft, nontender, no hepatomegaly  Ext: No edema, pulses 2+ Musculoskeletal: No deformities, BUE and BLE strength normal  and equal Skin: Warm and dry, no rashes   Neuro: Alert and oriented to person, place, time, and situation, CNII-XII grossly intact, no focal deficits  Psych: Normal mood and affect   Labs  High Sensitivity Troponin:  No results for input(s): TROPONINIHS in the last 720 hours.   Cardiac EnzymesNo results for input(s): TROPONINI in the last 168 hours. No results for input(s): TROPIPOC in the last 168 hours.  Chemistry Recent Labs  Lab 07/11/21 1401 07/11/21 2035 07/12/21 0425  NA 141 139 140  K 4.1 3.9 3.8  CL 108 105 107  CO2 24 23 23   GLUCOSE 98 95 87  BUN 19 15 15   CREATININE 1.32* 1.40* 1.27*  CALCIUM 9.4 9.6 9.0  PROT  --  7.3  --   ALBUMIN  --  4.3  --   AST  --  38  --   ALT  --  48*  --   ALKPHOS  --  56  --   BILITOT   --  3.3*  --   GFRNONAA >60 >60 >60  ANIONGAP 9 11 10     Hematology Recent Labs  Lab 07/11/21 1401 07/11/21 2035  WBC 8.5 9.1  RBC 5.29 5.41  HGB 15.7 15.7  HCT 48.1 48.5  MCV 90.9 89.6  MCH 29.7 29.0  MCHC 32.6 32.4  RDW 12.9 12.8  PLT 252 273   BNP Recent Labs  Lab 07/11/21 1401  BNP 1,884.0*    DDimer No results for input(s): DDIMER in the last 168 hours.   Radiology  DG Chest 2 View  Result Date: 07/11/2021 CLINICAL DATA:  Shortness of breath for 2 days. Nonproductive cough. Former smoker. EXAM: CHEST - 2 VIEW COMPARISON:  None. FINDINGS: The cardiac silhouette is mildly enlarged. There is peribronchial thickening bilaterally. No confluent airspace opacity, overt pulmonary edema, pleural effusion, or pneumothorax is identified. No acute osseous abnormality is seen. IMPRESSION: Bronchitic changes and mild cardiomegaly. Electronically Signed   By: Logan Bores M.D.   On: 07/11/2021 13:22    Cardiac Studies  Echo my review EF 20 to 25%, severe dilation of left ventricle 7.6 cm, mild to moderate RV dysfunction  Patient Profile  Thomas Wood is a 25 y.o. male with asthma and childhood obesity who was admitted on 07/11/2021 with new onset systolic heart failure.  Assessment & Plan   #New onset systolic heart failure, EF 20-25% -Admitted with shortness of breath over the past few weeks.  Diuresed 1 L with improvement in shortness of breath. -Formal echo is pending but on my review shows EF 20 to 25% with global hypokinesis.  LV is severely dilated.  Mild to moderate RV dysfunction. -Cardiac exam has no murmurs. -He has hemodynamically stable.  No signs of cardiogenic shock. -TSH is normal.  HIV is negative.  -I have ordered UDS. -Inflammatory markers are negative.  He denies any drug use.  No recent infection. -He informed that his father had a myocardial infarction in his 74s.  This ultimately resulted in heart transplantation.  No history of multiple family  members with heart failure. -Unclear etiology of his LV dysfunction. -He is hemodynamically stable.  We will start Coreg 3.125 mg twice daily, Aldactone 12.5 mg daily, Farxiga 10 mg daily.  Plan to start Entresto 24-26 mg twice daily tomorrow. -We will proceed with left and right heart catheterization this morning.  Explained risk and benefits.  He is willing to proceed.  We need to exclude coronary disease given his  father's history of premature CAD. -We will check high-sensitivity troponin.  These were not checked in the emergency room. -Discussed his case with Dr. Arvilla Meres.  I believe he would benefit from left and right heart catheterization with the advanced heart failure team.  Suspect he will ultimately need a cardiac MRI this admission as well.  Given severe LV dysfunction and severely dilated LV I believe he will also need their consultation.  Shared Decision Making/Informed Consent The risks [stroke (1 in 1000), death (1 in 1000), kidney failure [usually temporary] (1 in 500), bleeding (1 in 200), allergic reaction [possibly serious] (1 in 200)], benefits (diagnostic support and management of coronary artery disease) and alternatives of a cardiac catheterization were discussed in detail with Mr. Kretschmer and he is willing to proceed.  For questions or updates, please contact [facility] Please consult [email or web address] for contact info under   Time Spent with Patient: I have spent a total of 35 minutes with patient reviewing hospital notes, telemetry, EKGs, labs and examining the patient as well as establishing an assessment and plan that was discussed with the patient.  > 50% of time was spent in direct patient care.    Signed, Lenna Gilford. Flora Lipps, MD, [facility] [place]  [facility]  07/12/2021 7:49 AM

## 2021-07-12 NOTE — Consult Note (Addendum)
Advanced Heart Failure Team Consult Note   Primary Physician: Pcp, No PCP-Cardiologist:  Thurmon Fair, MD  Reason for Consultation: New Acute Systolic Heart Failure  HPI:    Thomas Wood is seen today for evaluation of new systolic heart failure at the request of Dr. Flora Lipps, Cardiology.   25 y/o male w/ family h/o premature CAD and heart failure. His father had several MIs in his 30s w/ subsequent heart failure and ultimately underwent placement of ICD and LVAD as bridge to transplant. Underwent heart transplant 2 years ago (in his 74s). The patient himself has h/o obesity and asthma and recent unintentional 30 lb wt loss over the last several months. Denies any h/o tobacco, ETOH or use of illicit drugs.   Initially presented to [facility] w/ complaints of 2 weeks of new and progressive exertional dyspnea. COVID/Flu negative. CXR showed cardiomegaly. BNP elevated 1884. EKG showed SR w/ PVCs and low voltage. Transferred to [facility] for acute CHF and admitted by cardiology. Started on IV Lasix.   Echo shows severe biventricular failure LVEF <20% w/ global HK. The LV is severely dilated. RV severely reduced. Severe MR.   TSH normal. HIV negative. Hs trop 81. Inflammatory markers negative. Sed Rate 8. CRP 0.6. UDS + for THC.   CBC unremarkable, WBC 8.5. Hgb 15.7.  Na 140. K 3.8. CO2 23, SCr 1.3, Mg 1.9  AHF  team consulted. Planning R/LHC today.   Echo 07/12/21 Left ventricular ejection fraction, by estimation, is <20%. The left ventricle has severely decreased function. The left ventricle demonstrates global hypokinesis. The left ventricular internal cavity size was severely dilated. There is mild eccentric left ventricular hypertrophy. Left ventricular diastolic parameters are consistent with Grade II diastolic dysfunction (pseudonormalizatio n) No LV thrombus with contrast 1. Right ventricular systolic function is severely reduced. The right ventricular size is moderately  enlarged. There is mildly elevated pulmonary artery systolic pressure. 2. 3. Left atrial size was severely dilated. 4. Right atrial size was moderately dilated. Thickening of the mitral valve leaflets with posteriorly directed regurgitation. PISA may underestimate the severity with eccentricity. The mitral valve is abnormal. Severe mitral valve regurgitation. 5. 6. Tricuspid valve regurgitation is mild to moderate. The aortic valve is normal in structure. Aortic valve regurgitation is not visualized. No aortic stenosis is present. 7. The inferior vena cava is normal in size with greater than 50% respiratory variability, suggesting right atrial pressure of 3 mmHg. 8. Conclusion(s)/Recommendation(s): Bi-ventricular failure with likely severe functional mitral regurgitation.  Review of Systems: [y] = yes, [ ]  = no   General: Weight gain [ ] ; Weight loss [Y ]; Anorexia [ ] ; Fatigue [Y ]; Fever [ ] ; Chills [ ] ; Weakness [ ]   Cardiac: Chest pain/pressure [ ] ; Resting SOB [ Y]; Exertional SOB [ Y]; Orthopnea [ ] ; Pedal Edema [ ] ; Palpitations [ ] ; Syncope [ ] ; Presyncope [ ] ; Paroxysmal nocturnal dyspnea[ ]   Pulmonary: Cough [ ] ; Wheezing[ ] ; Hemoptysis[ ] ; Sputum [ ] ; Snoring [ ]   GI: Vomiting[ ] ; Dysphagia[ ] ; Melena[ ] ; Hematochezia [ ] ; Heartburn[ ] ; Abdominal pain [ ] ; Constipation [ ] ; Diarrhea [ ] ; BRBPR [ ]   GU: Hematuria[ ] ; Dysuria [ ] ; Nocturia[ ]   Vascular: Pain in legs with walking [ ] ; Pain in feet with lying flat [ ] ; Non-healing sores [ ] ; Stroke [ ] ; TIA [ ] ; Slurred speech [ ] ;  Neuro: Headaches[ ] ; Vertigo[ ] ; Seizures[ ] ; Paresthesias[ ] ;Blurred vision [ ] ; Diplopia [ ] ; Vision  changes [ ]   Ortho/Skin: Arthritis [ ] ; Joint pain [ ] ; Muscle pain [ ] ; Joint swelling [ ] ; Back Pain [ ] ; Rash [ ]   Psych: Depression[ ] ; Anxiety[ ]   Heme: Bleeding problems [ ] ; Clotting disorders [ ] ; Anemia [ ]   Endocrine: Diabetes [ ] ; Thyroid dysfunction[ ]   Home Medications Prior to  Admission medications   Medication Sig Start Date End Date Taking? Authorizing Provider  acetaminophen (TYLENOL) 500 MG tablet Take 1,000 mg by mouth every 6 (six) hours as needed for moderate pain.   Yes [provider]  albuterol (VENTOLIN HFA) 108 (90 Base) MCG/ACT inhaler Inhale 2 puffs into the lungs every 6 (six) hours as needed for wheezing. 07/11/21  Yes [provider]  cetirizine (ZYRTEC) 10 MG tablet Take 10 mg by mouth daily as needed.   Yes [provider]  aluminum chloride (DRYSOL) 20 % external solution Apply topically at bedtime. Patient not taking: No sig reported 09/18/17   Raylene Everts, MD    Past Medical History: Past Medical History:  Diagnosis Date   Asthma    Chronic kidney disease    stones   GERD (gastroesophageal reflux disease)    New onset of congestive heart failure ([place]) 07/11/2021    Past Surgical History: History reviewed. No pertinent surgical history.  Family History: Family History  Problem Relation Age of Onset   Asthma Mother    Cancer Mother        breast   Miscarriages / Stillbirths Mother    Kidney disease Mother        stones   Diabetes Father    Heart disease Father 71        MI   Heart failure Father        LVAD>>Heart Transplant (14s)   Asthma Brother     Social History: Social History   Socioeconomic History   Marital status: Single    Spouse name: Not on file   Number of children: Not on file   Years of education: Not on file   Highest education level: Not on file  Occupational History   Not on file  Tobacco Use   Smoking status: Never   Smokeless tobacco: Never  Substance and Sexual Activity   Alcohol use: No   Drug use: No   Sexual activity: Yes    Birth control/protection: Pill  Other Topics Concern   Not on file  Social History Narrative   Not on file   Social Determinants of Health   Financial Resource Strain: Not on file  Food Insecurity: Not on file  Transportation  Needs: Not on file  Physical Activity: Not on file  Stress: Not on file  Social Connections: Not on file    Allergies:  No Known Allergies  Objective:    Vital Signs:   Temp:  [97.7 F (36.5 C)-98.6 F (37 C)] 98.3 F (36.8 C) (11/11 1108) Pulse Rate:  [76-107] 81 (11/11 1108) Resp:  [16-20] 18 (11/11 1108) BP: (100-129)/(69-95) 117/78 (11/11 1108) SpO2:  [96 %-100 %] 100 % (11/11 1254) Weight:  [84.9 kg] 84.9 kg (11/11 0854) Last BM Date: 07/11/21  Weight change: Filed Weights   07/11/21 1253 07/12/21 0400 07/12/21 0854  Weight: 89.4 kg 84.9 kg 84.9 kg    Intake/Output:   Intake/Output Summary (Last 24 hours) at 07/12/2021 1343 Last data filed at 07/12/2021 0933 Gross per 24 hour  Intake 20 ml  Output 1600 ml  Net -1580 ml  Physical Exam    General:  Well appearing. No resp difficulty HEENT: normal Neck: supple. JVP . Carotids 2+ bilat; no bruits. No lymphadenopathy or thyromegaly appreciated. Cor: PMI nondisplaced. Regular rate & rhythm. No rubs, gallops or murmurs. Lungs: clear Abdomen: soft, nontender, nondistended. No hepatosplenomegaly. No bruits or masses. Good bowel sounds. Extremities: no cyanosis, clubbing, rash, 2+ Nedema Neuro: alert & orientedx3, cranial nerves grossly intact. moves all 4 extremities w/o difficulty. Affect pleasant   Telemetry   NSR 90s   EKG    NSR w/ PACs, low voltage. 78 bpm   Labs   Basic Metabolic Panel: Recent Labs  Lab 07/11/21 1401 07/11/21 2035 07/12/21 0425  NA 141 139 140  K 4.1 3.9 3.8  CL 108 105 107  CO2 $Re'24 23 23  'HHq$ GLUCOSE 98 95 87  BUN $Re'19 15 15  'bPd$ CREATININE 1.32* 1.40* 1.27*  CALCIUM 9.4 9.6 9.0  MG 1.8 1.9  --     Liver Function Tests: Recent Labs  Lab 07/11/21 2035  AST 38  ALT 48*  ALKPHOS 56  BILITOT 3.3*  PROT 7.3  ALBUMIN 4.3   No results for input(s): LIPASE, AMYLASE in the last 168 hours. No results for input(s): AMMONIA in the last 168 hours.  CBC: Recent Labs  Lab  07/11/21 1401 07/11/21 2035  WBC 8.5 9.1  NEUTROABS 5.8 6.5  HGB 15.7 15.7  HCT 48.1 48.5  MCV 90.9 89.6  PLT 252 273    Cardiac Enzymes: No results for input(s): CKTOTAL, CKMB, CKMBINDEX, TROPONINI in the last 168 hours.  BNP: BNP (last 3 results) Recent Labs    07/11/21 1401  BNP 1,884.0*    ProBNP (last 3 results) No results for input(s): PROBNP in the last 8760 hours.   CBG: No results for input(s): GLUCAP in the last 168 hours.  Coagulation Studies: No results for input(s): LABPROT, INR in the last 72 hours.   Imaging   ECHOCARDIOGRAM COMPLETE  Result Date: 07/12/2021    ECHOCARDIOGRAM REPORT   Patient Name:   Thomas Wood Date of Exam: 07/12/2021 Medical Rec #:  [id number]         Height:       71.0 in Accession #:    [id number]        Weight:       187.1 lb Date of Birth:  10/23/1995          BSA:          2.050 m Patient Age:    25 years          BP:           115/88 mmHg Patient Gender: M                 HR:           105 bpm. Exam Location:  Inpatient Procedure: 2D Echo, Cardiac Doppler, Color Doppler and Intracardiac            Opacification Agent Indications:    Dyspnea  History:        Patient has no prior history of Echocardiogram examinations.                 CHF; Signs/Symptoms:Shortness of Breath.  Sonographer:    Glo Herring Referring Phys: [id number] [place]  1. . Left ventricular ejection fraction, by estimation, is <20%. The left ventricle has severely decreased function. The left ventricle demonstrates global hypokinesis. The left ventricular internal cavity  size was severely dilated. There is mild eccentric left ventricular hypertrophy. Left ventricular diastolic parameters are consistent with Grade II diastolic dysfunction (pseudonormalizatio n) No LV thrombus with contrast  2. Right ventricular systolic function is severely reduced. The right ventricular size is moderately enlarged. There is mildly elevated pulmonary artery  systolic pressure.  3. Left atrial size was severely dilated.  4. Right atrial size was moderately dilated.  5. Thickening of the mitral valve leaflets with posteriorly directed regurgitation. PISA may underestimate the severity with eccentricity. The mitral valve is abnormal. Severe mitral valve regurgitation.  6. Tricuspid valve regurgitation is mild to moderate.  7. The aortic valve is normal in structure. Aortic valve regurgitation is not visualized. No aortic stenosis is present.  8. The inferior vena cava is normal in size with greater than 50% respiratory variability, suggesting right atrial pressure of 3 mmHg. Conclusion(s)/Recommendation(s): Bi-ventricular failure with likely severe functional mitral regurgitation. FINDINGS  Left Ventricle: No LV thrombus with contrast. Left ventricular ejection fraction, by estimation, is <20%. The left ventricle has severely decreased function. The left ventricle demonstrates global hypokinesis. The left ventricular internal cavity size was severely dilated. There is mild eccentric left ventricular hypertrophy. Left ventricular diastolic parameters are consistent with Grade II diastolic dysfunction (pseudonormalization). Right Ventricle: The right ventricular size is moderately enlarged. No increase in right ventricular wall thickness. Right ventricular systolic function is severely reduced. There is mildly elevated pulmonary artery systolic pressure. The tricuspid regurgitant velocity is 2.98 m/s, and with an assumed right atrial pressure of 3 mmHg, the estimated right ventricular systolic pressure is 53.6 mmHg. Left Atrium: Left atrial size was severely dilated. Right Atrium: Right atrial size was moderately dilated. Pericardium: There is no evidence of pericardial effusion. Mitral Valve: Thickening of the mitral valve leaflets with posteriorly directed regurgitation. PISA may underestimate the severity with eccentricity. The mitral valve is abnormal. Severe mitral  valve regurgitation. Tricuspid Valve: The tricuspid valve is normal in structure. Tricuspid valve regurgitation is mild to moderate. Aortic Valve: The aortic valve is normal in structure. Aortic valve regurgitation is not visualized. No aortic stenosis is present. Aortic valve mean gradient measures 2.0 mmHg. Aortic valve peak gradient measures 3.9 mmHg. Aortic valve area, by VTI measures 2.06 cm. Pulmonic Valve: The pulmonic valve was normal in structure. Pulmonic valve regurgitation is not visualized. Aorta: The aortic root and ascending aorta are structurally normal, with no evidence of dilitation. Venous: The inferior vena cava is normal in size with greater than 50% respiratory variability, suggesting right atrial pressure of 3 mmHg. IAS/Shunts: No atrial level shunt detected by color flow Doppler.  LEFT VENTRICLE PLAX 2D LVIDd:         7.60 cm      Diastology LVIDs:         7.10 cm      LV e' medial:    5.55 cm/s LV PW:         1.20 cm      LV E/e' medial:  14.5 LV IVS:        1.20 cm      LV e' lateral:   6.96 cm/s LVOT diam:     2.00 cm      LV E/e' lateral: 11.6 LV SV:         31 LV SV Index:   15 LVOT Area:     3.14 cm  LV Volumes (MOD) LV vol d, MOD A2C: 415.0 ml LV vol d, MOD A4C: 310.0 ml LV vol  s, MOD A2C: 333.0 ml LV vol s, MOD A4C: 245.0 ml LV SV MOD A2C:     82.0 ml LV SV MOD A4C:     310.0 ml LV SV MOD BP:      74.2 ml RIGHT VENTRICLE            IVC RV Basal diam:  4.00 cm    IVC diam: 2.30 cm RV S prime:     6.09 cm/s LEFT ATRIUM              Index        RIGHT ATRIUM           Index LA Vol (A2C):   119.0 ml 58.06 ml/m  RA Area:     17.10 cm LA Vol (A4C):   108.0 ml 52.69 ml/m  RA Volume:   45.40 ml  22.15 ml/m LA Biplane Vol: 118.0 ml 57.57 ml/m  AORTIC VALVE                    PULMONIC VALVE AV Area (Vmax):    2.32 cm     PV Vmax:       0.70 m/s AV Area (Vmean):   2.14 cm     PV Peak grad:  1.9 mmHg AV Area (VTI):     2.06 cm AV Vmax:           99.10 cm/s AV Vmean:          68.800 cm/s  AV VTI:            0.151 m AV Peak Grad:      3.9 mmHg AV Mean Grad:      2.0 mmHg LVOT Vmax:         73.10 cm/s LVOT Vmean:        46.900 cm/s LVOT VTI:          0.099 m LVOT/AV VTI ratio: 0.65  AORTA Ao Root diam: 2.60 cm Ao Asc diam:  2.20 cm MITRAL VALVE                  TRICUSPID VALVE MV Area (PHT): 6.07 cm       TR Peak grad:   35.5 mmHg MV Decel Time: 125 msec       TR Vmax:        298.00 cm/s MR Peak grad:    85.4 mmHg MR Mean grad:    48.0 mmHg    SHUNTS MR Vmax:         462.00 cm/s  Systemic VTI:  0.10 m MR Vmean:        318.0 cm/s   Systemic Diam: 2.00 cm MR PISA:         4.02 cm MR PISA Eff ROA: 28 mm MR PISA Radius:  0.80 cm MV E velocity: 80.60 cm/s MV A velocity: 47.60 cm/s MV E/A ratio:  1.69 Landscape architect signed by Phineas Inches Signature Date/Time: 07/12/2021/11:51:39 AM    Final      Medications:     Current Medications:  [MAR Hold] carvedilol  3.125 mg Oral BID WC   [MAR Hold] dapagliflozin propanediol  10 mg Oral Daily   [MAR Hold] heparin  5,000 Units Subcutaneous Q8H   influenza vac split quadrivalent PF  0.5 mL Intramuscular Tomorrow-1000   [MAR Hold] sodium chloride flush  3 mL Intravenous Q12H   sodium chloride flush  3 mL Intravenous Q12H   [MAR Hold] spironolactone  12.5 mg Oral Daily    Infusions:  [MAR Hold] sodium chloride     sodium chloride     sodium chloride 10 mL/hr at 07/12/21 0919      Assessment/Plan   Acute Systolic Heart Failure w/ Severe Biventricular Failure - Echo EF <20%, RV severely reduced, global HK (no prior study for comparison) - HS trop 81, CRP and ESR normal. TSH normal. HIV negative, Respiratory panel negative  - Admit EKG w/ PVCs and low voltage. BP not markedly high  - concern for familial CM. Father had CM in 59s and had LVAD as bridge to eventual transplant  - ? Tachymediated/ PVC CM - Plan R/LHC today  - cMRI if cath unrevealing - genetic testing  - initiation of GDMT as BP allows  - outpatient Zio to  quantify PVCs   2. Severe MR - likely functional from severely dilated LV  - diuresis + GDMT  - repeat echo after better diuresis   3. PVCs - TSH normal  - keep K > 4.0 and Mg > 2.0  - outpatient zio - outpatient sleep study to r/o OSA     Length of Stay: 1  Brittainy Simmons, PA-C  07/12/2021, 1:43 PM  Advanced Heart Failure Team Pager [phone] (M-F; 7a - 5p)  Please contact [facility] for night-coverage after hours (4p -7a ) and weekends on [email or web address]  Agree with above  25 y/o male with h/o morbid obesity s/p marked weight loss admitted with acute systolic HF EF 74-82% with LVIDd > 7cm. Father ([name]) with h/o cardiac arrest/HF due to NICM and has undergone VAD placement (here) and O[facility]x at [facility].   Cath today with normal coronary arteries and low output. Complicated by severe vagal event and near arrest. (See cath note)  General:  Lying in bed  No resp difficulty HEENT: normal Neck: supple. LIJ TLC Carotids 2+ bilat; no bruits. No lymphadenopathy or thryomegaly appreciated. Cor: PMI laterally displaced. Regular rate & rhythm. 3/6 MR Lungs: clear Abdomen: obese soft, nontender, nondistended. No hepatosplenomegaly. No bruits or masses. Good bowel sounds. Extremities: no cyanosis, clubbing, rash, edema Neuro: alert & orientedx3, cranial nerves grossly intact. moves all 4 extremities w/o difficulty. 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 Will continue NE. Watch in ICU. Titrate meds as tolerated.   Will need cMRI and possible transplant w/u.   If discharged home will need LifeVest.   CRITICAL CARE Performed by: Glori Bickers  Total critical care time: 55 minutes  Critical care time was exclusive of separately billable procedures and treating other patients.  Critical care was necessary to treat or prevent imminent or life-threatening deterioration.  Critical care was time spent  personally by me (independent of midlevel providers or residents) on the following activities: development of treatment plan with patient and/or surrogate as well as nursing, discussions with consultants, evaluation of patient's response to treatment, examination of patient, obtaining history from patient or surrogate, ordering and performing treatments and interventions, ordering and review of laboratory studies, ordering and review of radiographic studies, pulse oximetry and re-evaluation of patient's condition.  Glori Bickers, MD  7:38 PM

## 2021-07-13 DIAGNOSIS — I5021 Acute systolic (congestive) heart failure: Secondary | ICD-10-CM | POA: Diagnosis not present

## 2021-07-13 LAB — TYPE AND SCREEN
ABO/RH(D): O POS
Antibody Screen: NEGATIVE

## 2021-07-13 LAB — BASIC METABOLIC PANEL
Anion gap: 8 (ref 5–15)
BUN: 12 mg/dL (ref 6–20)
CO2: 21 mmol/L — ABNORMAL LOW (ref 22–32)
Calcium: 8.4 mg/dL — ABNORMAL LOW (ref 8.9–10.3)
Chloride: 108 mmol/L (ref 98–111)
Creatinine, Ser: 1.06 mg/dL (ref 0.61–1.24)
GFR, Estimated: >60 mL/min (ref >60)
Glucose, Bld: 97 mg/dL (ref 70–99)
Potassium: 3.8 mmol/L (ref 3.5–5.1)
Sodium: 137 mmol/L (ref 135–145)

## 2021-07-13 LAB — HEPATIC FUNCTION PANEL
ALT: 32 U/L (ref 0–44)
AST: 23 U/L (ref 15–41)
Albumin: 3.1 g/dL — ABNORMAL LOW (ref 3.5–5.0)
Alkaline Phosphatase: 44 U/L (ref 38–126)
Bilirubin, Direct: 0.4 mg/dL — ABNORMAL HIGH (ref 0.0–0.2)
Indirect Bilirubin: 3.4 mg/dL — ABNORMAL HIGH (ref 0.3–0.9)
Total Bilirubin: 3.8 mg/dL — ABNORMAL HIGH (ref 0.3–1.2)
Total Protein: 5.4 g/dL — ABNORMAL LOW (ref 6.5–8.1)

## 2021-07-13 LAB — COOXEMETRY PANEL
Carboxyhemoglobin: 0.9 % (ref 0.5–1.5)
Methemoglobin: 0.6 % (ref 0.0–1.5)
O2 Saturation: 56.7 %
Total hemoglobin: 13.6 g/dL (ref 12.0–16.0)

## 2021-07-13 LAB — HEPATITIS B SURFACE ANTIGEN: Hepatitis B Surface Ag: NONREACTIVE

## 2021-07-13 LAB — MAGNESIUM: Magnesium: 2 mg/dL (ref 1.7–2.4)

## 2021-07-13 MED ORDER — SODIUM CHLORIDE 0.9% FLUSH: 10.0000 mL | INTRAVENOUS | Status: DC | PRN

## 2021-07-13 MED ORDER — SODIUM CHLORIDE 0.9% FLUSH
10.0000 mL | Freq: Two times a day (BID) | INTRAVENOUS | Status: DC
  Administered 2021-07-13 – 2021-07-14 (×3): 10 mL
  Administered 2021-07-15: 20 mL
  Administered 2021-07-15 – 2021-07-16 (×2): 10 mL

## 2021-07-13 MED ORDER — MEXILETINE HCL 200 MG PO CAPS
200.0000 mg | ORAL_CAPSULE | Freq: Two times a day (BID) | ORAL | Status: DC
  Administered 2021-07-13 – 2021-07-14 (×4): 200 mg via ORAL
  Filled 2021-07-13 (×5): qty 1

## 2021-07-13 MED ORDER — POTASSIUM CHLORIDE CRYS ER 20 MEQ PO TBCR
40.0000 meq | EXTENDED_RELEASE_TABLET | Freq: Once | ORAL | Status: AC
  Administered 2021-07-13: 40 meq via ORAL
  Filled 2021-07-13: qty 2

## 2021-07-13 MED ORDER — EMPAGLIFLOZIN 10 MG PO TABS
10.0000 mg | ORAL_TABLET | Freq: Every day | ORAL | Status: DC
  Administered 2021-07-13 – 2021-07-16 (×4): 10 mg via ORAL
  Filled 2021-07-13 (×4): qty 1

## 2021-07-13 NOTE — Progress Notes (Signed)
Called to room by pt who reports an episode of sudden onset dizziness without syncope, nausea, diaphoresis after urinating in the bathroom. BP immediately after unchanged from previous, 102/70; HR <105; no events noted on cardiac monitor. Symptoms resolved within 5 minutes.

## 2021-07-13 NOTE — Progress Notes (Signed)
PCCM:  Patient doing better today. Still on low dose pressor.  Adv HF team managing GDMT  Pulm is available if needed.  Please call with questions.   Josephine Igo, DO [facility] 07/13/2021 1:34 PM

## 2021-07-13 NOTE — Progress Notes (Signed)
Advanced Heart Failure Rounding Note   Subjective:    Cath yesterday complicated by severe vagal episode with near loss of pulse. Remains on NE at 4. Co-ox 57% CVP 2. Says he feels better. No CP or SOB. R groin site a little sore.   Cath 11/11 - Normal cors with distal LAD bridge Ao = 84/66 (78) LV = 108/28 RA =  3 RV = 31/4 PA = 32/17 (27) PCW = 21 Fick cardiac output/index = 3.6/1.7 PVR = 1.8 WU SVR = 1690 Ao sat = 97% PA sat = 61% 62% PAPi = 5  Objective:   Weight Range:  Vital Signs:   Temp:  [98 F (36.7 C)-98.5 F (36.9 C)] 98.5 F (36.9 C) (11/12 0700) Pulse Rate:  [43-163] 99 (11/12 0856) Resp:  [11-28] 15 (11/12 0800) BP: (79-145)/(51-113) 97/77 (11/12 0800) SpO2:  [90 %-100 %] 98 % (11/12 0800) Arterial Line BP: (102-125)/(63-77) 103/63 (11/11 2100) Weight:  [84.7 kg-88.6 kg] 84.7 kg (11/12 0800) Last BM Date: 07/11/21  Weight change: Filed Weights   07/12/21 0854 07/13/21 0500 07/13/21 0800  Weight: 84.9 kg 88.6 kg 84.7 kg    Intake/Output:   Intake/Output Summary (Last 24 hours) at 07/13/2021 1011 Last data filed at 07/13/2021 0909 Gross per 24 hour  Intake 925.72 ml  Output 1800 ml  Net -874.28 ml     Physical Exam: General:  Sitting in chair No resp difficulty HEENT: normal Neck: supple. JVP flat. Carotids 2+ bilat; no bruits. No lymphadenopathy or thryomegaly appreciated. Cor: PMI laterally displaced. Regular rate & rhythm. No rubs, gallops or murmurs. Lungs: clear Abdomen: soft, nontender, nondistended. No hepatosplenomegaly. No bruits or masses. Good bowel sounds. Extremities: no cyanosis, clubbing, rash, edema R groin site ok. R DP 2+ Neuro: alert & orientedx3, cranial nerves grossly intact. moves all 4 extremities w/o difficulty. Affect pleasant  Telemetry: Sinus 90s with PVCs. Personally reviewed   Labs: Basic Metabolic Panel: Recent Labs  Lab 07/11/21 1401 07/11/21 2035 07/12/21 0425 07/12/21 1330 07/12/21 1412  07/12/21 1421 07/12/21 1422 07/12/21 1715 07/13/21 0319  NA 141 139 140   < > 149* 148* 144 136 137  K 4.1 3.9 3.8   < > 2.8* 2.4* 2.8* 3.9 3.8  CL 108 105 107  --   --   --   --  109 108  CO2 24 23 23   --   --   --   --  17* 21*  GLUCOSE 98 95 87  --   --   --   --  129* 97  BUN 19 15 15   --   --   --   --  15 12  CREATININE 1.32* 1.40* 1.27*  --   --   --   --  1.20 1.06  CALCIUM 9.4 9.6 9.0  --   --   --   --  8.3* 8.4*  MG 1.8 1.9  --   --   --   --   --   --  2.0   < > = values in this interval not displayed.    Liver Function Tests: Recent Labs  Lab 07/11/21 2035 07/13/21 0319  AST 38 23  ALT 48* 32  ALKPHOS 56 44  BILITOT 3.3* 3.8*  PROT 7.3 5.4*  ALBUMIN 4.3 3.1*   No results for input(s): LIPASE, AMYLASE in the last 168 hours. No results for input(s): AMMONIA in the last 168 hours.  CBC: Recent Labs  Lab 07/11/21 1401 07/11/21 2035 07/12/21 1330 07/12/21 1411 07/12/21 1412 07/12/21 1421 07/12/21 1422 07/12/21 1740  WBC 8.5 9.1  --   --   --   --   --   --   NEUTROABS 5.8 6.5  --   --   --   --   --   --   HGB 15.7 15.7   < > 13.9 14.6 14.3 16.0 14.4  HCT 48.1 48.5   < > 41.0 43.0 42.0 47.0 43.2  MCV 90.9 89.6  --   --   --   --   --   --   PLT 252 273  --   --   --   --   --   --    < > = values in this interval not displayed.    Cardiac Enzymes: No results for input(s): CKTOTAL, CKMB, CKMBINDEX, TROPONINI in the last 168 hours.  BNP: BNP (last 3 results) Recent Labs    07/11/21 1401  BNP 1,884.0*    ProBNP (last 3 results) No results for input(s): PROBNP in the last 8760 hours.    Other results:  Imaging: DG Chest 2 View  Result Date: 07/11/2021 CLINICAL DATA:  Shortness of breath for 2 days. Nonproductive cough. Former smoker. EXAM: CHEST - 2 VIEW COMPARISON:  None. FINDINGS: The cardiac silhouette is mildly enlarged. There is peribronchial thickening bilaterally. No confluent airspace opacity, overt pulmonary edema, pleural  effusion, or pneumothorax is identified. No acute osseous abnormality is seen. IMPRESSION: Bronchitic changes and mild cardiomegaly. Electronically Signed   By: Logan Bores M.D.   On: 07/11/2021 13:22   CARDIAC CATHETERIZATION  Result Date: 07/12/2021   Non-stenotic Dist LAD lesion. Findings: Ao = 84/66 (78) LV = 108/28 RA =  3 RV = 31/4 PA = 32/17 (27) PCW = 21 Fick cardiac output/index = 3.6/1.7 PVR = 1.8 WU SVR = 1690 Ao sat = 97% PA sat = 61% 62% PAPi = 5 Assessment: 1. 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 Elevated filling pressures with markedly depressed CO 4. 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 Will continue NE. Watch in ICU. Titrate meds as tolerated. Will need cMRI and possible transplant w/u. Glori Bickers, MD 7:30 PM  ECHOCARDIOGRAM COMPLETE  Result Date: 07/12/2021    ECHOCARDIOGRAM REPORT   Patient Name:   Thomas Wood Date of Exam: 07/12/2021 Medical Rec #:  [id number]         Height:       71.0 in Accession #:    [id number]        Weight:       187.1 lb Date of Birth:  April 19, 1996          BSA:          2.050 m Patient Age:    25 years          BP:           115/88 mmHg Patient Gender: M                 HR:           105 bpm. Exam Location:  Inpatient Procedure: 2D Echo, Cardiac Doppler, Color Doppler and Intracardiac  Opacification Agent Indications:    Dyspnea  History:        Patient has no prior history of Echocardiogram examinations.                 CHF; Signs/Symptoms:Shortness of Breath.  Sonographer:    Vanetta Shawl Referring Phys: [id number] ANGELA NICOLE [facility] IMPRESSIONS  1. . Left ventricular ejection fraction, by estimation, is <20%. The left ventricle has severely decreased function. The left ventricle demonstrates global  hypokinesis. The left ventricular internal cavity size was severely dilated. There is mild eccentric left ventricular hypertrophy. Left ventricular diastolic parameters are consistent with Grade II diastolic dysfunction (pseudonormalizatio n) No LV thrombus with contrast  2. Right ventricular systolic function is severely reduced. The right ventricular size is moderately enlarged. There is mildly elevated pulmonary artery systolic pressure.  3. Left atrial size was severely dilated.  4. Right atrial size was moderately dilated.  5. Thickening of the mitral valve leaflets with posteriorly directed regurgitation. PISA may underestimate the severity with eccentricity. The mitral valve is abnormal. Severe mitral valve regurgitation.  6. Tricuspid valve regurgitation is mild to moderate.  7. The aortic valve is normal in structure. Aortic valve regurgitation is not visualized. No aortic stenosis is present.  8. The inferior vena cava is normal in size with greater than 50% respiratory variability, suggesting right atrial pressure of 3 mmHg. Conclusion(s)/Recommendation(s): Bi-ventricular failure with likely severe functional mitral regurgitation. FINDINGS  Left Ventricle: No LV thrombus with contrast. Left ventricular ejection fraction, by estimation, is <20%. The left ventricle has severely decreased function. The left ventricle demonstrates global hypokinesis. The left ventricular internal cavity size was severely dilated. There is mild eccentric left ventricular hypertrophy. Left ventricular diastolic parameters are consistent with Grade II diastolic dysfunction (pseudonormalization). Right Ventricle: The right ventricular size is moderately enlarged. No increase in right ventricular wall thickness. Right ventricular systolic function is severely reduced. There is mildly elevated pulmonary artery systolic pressure. The tricuspid regurgitant velocity is 2.98 m/s, and with an assumed right atrial pressure of 3 mmHg, the  estimated right ventricular systolic pressure is 38.5 mmHg. Left Atrium: Left atrial size was severely dilated. Right Atrium: Right atrial size was moderately dilated. Pericardium: There is no evidence of pericardial effusion. Mitral Valve: Thickening of the mitral valve leaflets with posteriorly directed regurgitation. PISA may underestimate the severity with eccentricity. The mitral valve is abnormal. Severe mitral valve regurgitation. Tricuspid Valve: The tricuspid valve is normal in structure. Tricuspid valve regurgitation is mild to moderate. Aortic Valve: The aortic valve is normal in structure. Aortic valve regurgitation is not visualized. No aortic stenosis is present. Aortic valve mean gradient measures 2.0 mmHg. Aortic valve peak gradient measures 3.9 mmHg. Aortic valve area, by VTI measures 2.06 cm. Pulmonic Valve: The pulmonic valve was normal in structure. Pulmonic valve regurgitation is not visualized. Aorta: The aortic root and ascending aorta are structurally normal, with no evidence of dilitation. Venous: The inferior vena cava is normal in size with greater than 50% respiratory variability, suggesting right atrial pressure of 3 mmHg. IAS/Shunts: No atrial level shunt detected by color flow Doppler.  LEFT VENTRICLE PLAX 2D LVIDd:         7.60 cm      Diastology LVIDs:         7.10 cm      LV e' medial:    5.55 cm/s LV PW:         1.20 cm      LV E/e' medial:  14.5 LV IVS:        1.20 cm      LV e' lateral:   6.96 cm/s LVOT diam:     2.00 cm      LV E/e' lateral: 11.6 LV SV:         31 LV SV Index:   15 LVOT Area:     3.14 cm  LV Volumes (MOD) LV vol d, MOD A2C: 415.0 ml LV vol d, MOD A4C: 310.0 ml LV vol s, MOD A2C: 333.0 ml LV vol s, MOD A4C: 245.0 ml LV SV MOD A2C:     82.0 ml LV SV MOD A4C:     310.0 ml LV SV MOD BP:      74.2 ml RIGHT VENTRICLE            IVC RV Basal diam:  4.00 cm    IVC diam: 2.30 cm RV S prime:     6.09 cm/s LEFT ATRIUM              Index        RIGHT ATRIUM            Index LA Vol (A2C):   119.0 ml 58.06 ml/m  RA Area:     17.10 cm LA Vol (A4C):   108.0 ml 52.69 ml/m  RA Volume:   45.40 ml  22.15 ml/m LA Biplane Vol: 118.0 ml 57.57 ml/m  AORTIC VALVE                    PULMONIC VALVE AV Area (Vmax):    2.32 cm     PV Vmax:       0.70 m/s AV Area (Vmean):   2.14 cm     PV Peak grad:  1.9 mmHg AV Area (VTI):     2.06 cm AV Vmax:           99.10 cm/s AV Vmean:          68.800 cm/s AV VTI:            0.151 m AV Peak Grad:      3.9 mmHg AV Mean Grad:      2.0 mmHg LVOT Vmax:         73.10 cm/s LVOT Vmean:        46.900 cm/s LVOT VTI:          0.099 m LVOT/AV VTI ratio: 0.65  AORTA Ao Root diam: 2.60 cm Ao Asc diam:  2.20 cm MITRAL VALVE                  TRICUSPID VALVE MV Area (PHT): 6.07 cm       TR Peak grad:   35.5 mmHg MV Decel Time: 125 msec       TR Vmax:        298.00 cm/s MR Peak grad:    85.4 mmHg MR Mean grad:    48.0 mmHg    SHUNTS MR Vmax:         462.00 cm/s  Systemic VTI:  0.10 m MR Vmean:        318.0 cm/s   Systemic Diam: 2.00 cm MR PISA:         4.02 cm MR PISA Eff ROA: 28 mm MR PISA Radius:  0.80 cm MV E velocity: 80.60 cm/s MV A velocity: 47.60 cm/s MV E/A ratio:  1.69 Landscape architect signed by Phineas Inches Signature Date/Time: 07/12/2021/11:51:39 AM  Final      Medications:     Scheduled Medications:  Chlorhexidine Gluconate Cloth  6 each Topical Daily   digoxin  0.125 mg Oral Daily   enoxaparin (LOVENOX) injection  40 mg Subcutaneous Q24H   sodium chloride flush  10-40 mL Intracatheter Q12H    Infusions:  norepinephrine (LEVOPHED) Adult infusion 4 mcg/min (07/13/21 0900)    PRN Medications: acetaminophen, albuterol, ondansetron (ZOFRAN) IV, sodium chloride flush, traMADol   Assessment/Plan:   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 Normal cors with distal LAD bridge. Cath complicated by severe vagal  episode with near loss of pulse. - concern for familial CM. Possible contribution from frequent PVCs, Father had CM in 20s and had LVAD as bridge to eventual transplant  - Remains on NE at 4. Co-ox marginal at 57%. Will try to wean. If unable to wean consider switch to dobutamine - Will try to wean off inotropes and give him trial of GDMT (+ LifeVest) but I am concerned that he may not respond well to GDMT given degree of LV dilatio and FMHx.  - Continue dig. Add Jardiance.  - cMRI ordered - Blood Type O-pos - will d/w [facility] on Monday   2. Severe MR -  functional due to severely dilated LV    3. Frequent PVCs - TSH normal  - ~20 PVCs per minute (two primary morphologies) - May be contributing to CM. Start mexilitene 200 bid - keep K > 4.0 and Mg > 2.0  - outpatient sleep study to r/o OSA    CRITICAL CARE Performed by: Glori Bickers  Total critical care time: 45 minutes  Critical care time was exclusive of separately billable procedures and treating other patients.  Critical care was necessary to treat or prevent imminent or life-threatening deterioration.  Critical care was time spent personally by me (independent of midlevel providers or residents) on the following activities: development of treatment plan with patient and/or surrogate as well as nursing, discussions with consultants, evaluation of patient's response to treatment, examination of patient, obtaining history from patient or surrogate, ordering and performing treatments and interventions, ordering and review of laboratory studies, ordering and review of radiographic studies, pulse oximetry and re-evaluation of patient's condition.   Length of Stay: 2   Glori Bickers MD 07/13/2021, 10:11 AM  Advanced Heart Failure Team Pager [phone] (M-F; 7a - 4p)  Please contact [facility] for night-coverage after hours (4p -7a ) and weekends on [email or web address]

## 2021-07-14 DIAGNOSIS — I5021 Acute systolic (congestive) heart failure: Secondary | ICD-10-CM | POA: Diagnosis not present

## 2021-07-14 LAB — BASIC METABOLIC PANEL
Anion gap: 8 (ref 5–15)
BUN: 10 mg/dL (ref 6–20)
CO2: 23 mmol/L (ref 22–32)
Calcium: 8.6 mg/dL — ABNORMAL LOW (ref 8.9–10.3)
Chloride: 106 mmol/L (ref 98–111)
Creatinine, Ser: 1.01 mg/dL (ref 0.61–1.24)
GFR, Estimated: >60 mL/min (ref >60)
Glucose, Bld: 87 mg/dL (ref 70–99)
Potassium: 3.9 mmol/L (ref 3.5–5.1)
Sodium: 137 mmol/L (ref 135–145)

## 2021-07-14 LAB — COOXEMETRY PANEL
Carboxyhemoglobin: 1.2 % (ref 0.5–1.5)
Methemoglobin: 0.9 % (ref 0.0–1.5)
O2 Saturation: 66.3 %
Total hemoglobin: 14 g/dL (ref 12.0–16.0)

## 2021-07-14 LAB — HCV AB W REFLEX TO QUANT PCR: HCV Ab: <0.1 s/co ratio (ref 0.0–0.9)

## 2021-07-14 LAB — HCV INTERPRETATION

## 2021-07-14 LAB — HEMOGLOBIN A1C
Hgb A1c MFr Bld: 5.3 % (ref 4.8–5.6)
Mean Plasma Glucose: 105.41 mg/dL

## 2021-07-14 MED ORDER — SPIRONOLACTONE 12.5 MG HALF TABLET
12.5000 mg | ORAL_TABLET | Freq: Every day | ORAL | Status: DC
  Administered 2021-07-14: 12.5 mg via ORAL
  Filled 2021-07-14: qty 1

## 2021-07-14 MED ORDER — SENNA 8.6 MG PO TABS
1.0000 | ORAL_TABLET | Freq: Every day | ORAL | Status: DC
Start: 2021-07-14 — End: 2021-07-17
  Administered 2021-07-15 – 2021-07-16 (×2): 8.6 mg via ORAL
  Filled 2021-07-14 (×3): qty 1

## 2021-07-14 MED ORDER — DOCUSATE SODIUM 100 MG PO CAPS
100.0000 mg | ORAL_CAPSULE | Freq: Two times a day (BID) | ORAL | Status: DC
  Administered 2021-07-15 – 2021-07-16 (×4): 100 mg via ORAL
  Filled 2021-07-14 (×6): qty 1

## 2021-07-14 NOTE — Progress Notes (Signed)
Advanced Heart Failure Rounding Note   Subjective:    - Cath 11/11 complicated by severe vagal episode with near loss of pulse.   NE weaned off yesterday. Co-ox 66%. CVP 5. Had episode of dizziness after urinating last night. Has not recurred. No SOB, orthopnea or PND.   Objective:   Weight Range:  Vital Signs:   Temp:  [98.1 F (36.7 C)-98.6 F (37 C)] 98.2 F (36.8 C) (11/13 0730) Pulse Rate:  [62-126] 82 (11/13 0900) Resp:  [10-26] 16 (11/13 0900) BP: (85-112)/(44-85) 94/76 (11/13 0900) SpO2:  [94 %-100 %] 97 % (11/13 0900) Weight:  [83.9 kg] 83.9 kg (11/12 1400) Last BM Date: 07/11/21  Weight change: Filed Weights   07/12/21 0854 07/13/21 0800 07/13/21 1400  Weight: 84.9 kg 84.7 kg 83.9 kg    Intake/Output:   Intake/Output Summary (Last 24 hours) at 07/14/2021 1001 Last data filed at 07/14/2021 0500 Gross per 24 hour  Intake 1141.86 ml  Output 3980 ml  Net -2838.14 ml     Physical Exam: General:  Well appearing. No resp difficulty HEENT: normal Neck: supple. no JVD. LIJ TLC Carotids 2+ bilat; no bruits. No lymphadenopathy or thryomegaly appreciated. Cor: PMI nondisplaced. Regular rate & rhythm. No rubs, gallops or murmurs. Lungs: clear Abdomen: soft, nontender, nondistended. No hepatosplenomegaly. No bruits or masses. Good bowel sounds. Extremities: no cyanosis, clubbing, rash, edema Neuro: alert & orientedx3, cranial nerves grossly intact. moves all 4 extremities w/o difficulty. Affect pleasant   Telemetry: Sinus 80-90s with PACs/PVCs. Personally reviewed   Labs: Basic Metabolic Panel: Recent Labs  Lab 07/11/21 1401 07/11/21 2035 07/12/21 0425 07/12/21 1330 07/12/21 1421 07/12/21 1422 07/12/21 1715 07/13/21 0319 07/14/21 0508  NA 141 139 140   < > 148* 144 136 137 137  K 4.1 3.9 3.8   < > 2.4* 2.8* 3.9 3.8 3.9  CL 108 105 107  --   --   --  109 108 106  CO2 24 23 23   --   --   --  17* 21* 23  GLUCOSE 98 95 87  --   --   --  129* 97 87   BUN 19 15 15   --   --   --  15 12 10   CREATININE 1.32* 1.40* 1.27*  --   --   --  1.20 1.06 1.01  CALCIUM 9.4 9.6 9.0  --   --   --  8.3* 8.4* 8.6*  MG 1.8 1.9  --   --   --   --   --  2.0  --    < > = values in this interval not displayed.     Liver Function Tests: Recent Labs  Lab 07/11/21 2035 07/13/21 0319  AST 38 23  ALT 48* 32  ALKPHOS 56 44  BILITOT 3.3* 3.8*  PROT 7.3 5.4*  ALBUMIN 4.3 3.1*    No results for input(s): LIPASE, AMYLASE in the last 168 hours. No results for input(s): AMMONIA in the last 168 hours.  CBC: Recent Labs  Lab 07/11/21 1401 07/11/21 2035 07/12/21 1330 07/12/21 1411 07/12/21 1412 07/12/21 1421 07/12/21 1422 07/12/21 1740  WBC 8.5 9.1  --   --   --   --   --   --   NEUTROABS 5.8 6.5  --   --   --   --   --   --   HGB 15.7 15.7   < > 13.9 14.6 14.3 16.0 14.4  HCT 48.1 48.5   < > 41.0 43.0 42.0 47.0 43.2  MCV 90.9 89.6  --   --   --   --   --   --   PLT 252 273  --   --   --   --   --   --    < > = values in this interval not displayed.     Cardiac Enzymes: No results for input(s): CKTOTAL, CKMB, CKMBINDEX, TROPONINI in the last 168 hours.  BNP: BNP (last 3 results) Recent Labs    07/11/21 1401  BNP 1,884.0*     ProBNP (last 3 results) No results for input(s): PROBNP in the last 8760 hours.    Other results:  Imaging: CARDIAC CATHETERIZATION  Result Date: 07/12/2021   Non-stenotic Dist LAD lesion. Findings: Ao = 84/66 (78) LV = 108/28 RA =  3 RV = 31/4 PA = 32/17 (27) PCW = 21 Fick cardiac output/index = 3.6/1.7 PVR = 1.8 WU SVR = 1690 Ao sat = 97% PA sat = 61% 62% PAPi = 5 Assessment: 1. 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 Elevated filling pressures with markedly depressed CO 4. 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 Will continue NE. Watch in ICU. Titrate meds as tolerated. Will need cMRI and possible transplant w/u. Arvilla Meres, MD 7:30 PM    Medications:     Scheduled Medications:  Chlorhexidine Gluconate Cloth  6 each Topical Daily   digoxin  0.125 mg Oral Daily   docusate sodium  100 mg Oral BID   empagliflozin  10 mg Oral Daily   enoxaparin (LOVENOX) injection  40 mg Subcutaneous Q24H   mexiletine  200 mg Oral BID   senna  1 tablet Oral Daily   sodium chloride flush  10-40 mL Intracatheter Q12H   spironolactone  12.5 mg Oral Daily    Infusions:  norepinephrine (LEVOPHED) Adult infusion Stopped (07/13/21 1651)    PRN Medications: acetaminophen, albuterol, ondansetron (ZOFRAN) IV, sodium chloride flush, traMADol   Assessment/Plan:   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 Normal cors with distal LAD bridge. Cath complicated by severe vagal episode with near loss of pulse. - concern for familial CM. Possible contribution from frequent PVCs, Father had CM in 47s and had LVAD as bridge to eventual transplant  - Off NE - Will need trial of GDMT (+ LifeVest) but I am concerned that he may not respond well to GDMT given degree of LV dilatio nand FMHx.  - Continue dig and Jardiance.  - Add spiro 12.5 today - cMRI ordered - Blood Type O-pos - will d/w [facility] tomorrow   2. Severe MR -  functional due to severely dilated LV    3. Frequent PVCs - TSH normal  - ~20 PVCs per minute (two primary morphologies) - May be contributing to CM. Mexilitene 200 bid started 11/12. PVC burden improving - keep K > 4.0 and Mg > 2.0  - outpatient sleep study to r/o OSA   [facility]  Length of Stay: 3   Arvilla Meres MD 07/14/2021, 10:01 AM  Advanced Heart Failure Team Pager [phone] (M-F; 7a - 4p)  Please contact  [facility] for  night-coverage after hours (4p -7a ) and weekends on [email or web address]

## 2021-07-14 NOTE — Progress Notes (Signed)
Transferred-in from 2H by wheelchair awake and alert 

## 2021-07-15 ENCOUNTER — Inpatient Hospital Stay (HOSPITAL_COMMUNITY): Payer: 59

## 2021-07-15 ENCOUNTER — Encounter (HOSPITAL_COMMUNITY): Payer: Self-pay | Admitting: Internal Medicine

## 2021-07-15 ENCOUNTER — Other Ambulatory Visit (HOSPITAL_COMMUNITY): Payer: Self-pay

## 2021-07-15 DIAGNOSIS — I5021 Acute systolic (congestive) heart failure: Secondary | ICD-10-CM | POA: Diagnosis not present

## 2021-07-15 DIAGNOSIS — I509 Heart failure, unspecified: Secondary | ICD-10-CM

## 2021-07-15 LAB — COOXEMETRY PANEL
Carboxyhemoglobin: 1 % (ref 0.5–1.5)
Methemoglobin: 0.7 % (ref 0.0–1.5)
O2 Saturation: 64.2 %
Total hemoglobin: 13.8 g/dL (ref 12.0–16.0)

## 2021-07-15 LAB — BASIC METABOLIC PANEL
Anion gap: 8 (ref 5–15)
BUN: 13 mg/dL (ref 6–20)
CO2: 23 mmol/L (ref 22–32)
Calcium: 8.8 mg/dL — ABNORMAL LOW (ref 8.9–10.3)
Chloride: 104 mmol/L (ref 98–111)
Creatinine, Ser: 1.14 mg/dL (ref 0.61–1.24)
GFR, Estimated: >60 mL/min (ref >60)
Glucose, Bld: 80 mg/dL (ref 70–99)
Potassium: 3.7 mmol/L (ref 3.5–5.1)
Sodium: 135 mmol/L (ref 135–145)

## 2021-07-15 MED ORDER — MEXILETINE HCL 150 MG PO CAPS
300.0000 mg | ORAL_CAPSULE | Freq: Two times a day (BID) | ORAL | Status: DC
  Administered 2021-07-15 – 2021-07-16 (×4): 300 mg via ORAL
  Filled 2021-07-15 (×4): qty 2

## 2021-07-15 MED ORDER — GADOBUTROL 1 MMOL/ML IV SOLN
8.0000 mL | Freq: Once | INTRAVENOUS | Status: AC | PRN
  Administered 2021-07-15: 8 mL via INTRAVENOUS

## 2021-07-15 MED ORDER — POTASSIUM CHLORIDE CRYS ER 20 MEQ PO TBCR
40.0000 meq | EXTENDED_RELEASE_TABLET | Freq: Once | ORAL | Status: AC
  Administered 2021-07-15: 40 meq via ORAL
  Filled 2021-07-15: qty 2

## 2021-07-15 MED ORDER — SPIRONOLACTONE 25 MG PO TABS
25.0000 mg | ORAL_TABLET | Freq: Every day | ORAL | Status: DC
  Administered 2021-07-15 – 2021-07-16 (×2): 25 mg via ORAL
  Filled 2021-07-15 (×2): qty 1

## 2021-07-15 MED FILL — Atropine Sulfate Soln Prefill Syr 1 MG/10ML (0.1 MG/ML): INTRAMUSCULAR | Qty: 10 | Status: AC

## 2021-07-15 NOTE — Progress Notes (Signed)
Mobility Specialist Progress Note    07/15/21 1331  Mobility  Activity Ambulated in hall  Level of Assistance Independent  Assistive Device None  Distance Ambulated (ft) 820 ft  Mobility Ambulated independently in hallway  Mobility Response Tolerated well  Mobility performed by Mobility specialist  Bed Position Chair  $Mobility charge 1 Mobility   Pt received in chair and agreeable. No complaints or SOB. HR from 119-129 on walk. Returned to chair with call bell in reach.   [place] Mobility Specialist  Mobility Specialist Phone: [phone]

## 2021-07-15 NOTE — Progress Notes (Addendum)
Advanced Heart Failure Rounding Note   Subjective:    - Cath 11/11 complicated by severe vagal episode with near loss of pulse.  -11/13 Moved to 2C . Started on low dose spiro.  - CMRI today   CO-OX 64%.   Feels good. Denies SOB, orthopnea or PND. SBP in 90s     Objective:   Weight Range:  Vital Signs:   Temp:  [98 F (36.7 C)-98.3 F (36.8 C)] 98 F (36.7 C) (11/14 0309) Pulse Rate:  [78-112] 78 (11/14 0309) Resp:  [11-22] 16 (11/14 0309) BP: (94-135)/(74-119) 97/78 (11/14 0309) SpO2:  [97 %-100 %] 100 % (11/14 0309) Weight:  [82.8 kg] 82.8 kg (11/14 0309) Last BM Date: 07/14/21  Weight change: Filed Weights   07/13/21 0800 07/13/21 1400 07/15/21 0309  Weight: 84.7 kg 83.9 kg 82.8 kg    Intake/Output:   Intake/Output Summary (Last 24 hours) at 07/15/2021 0849 Last data filed at 07/15/2021 0200 Gross per 24 hour  Intake 250 ml  Output 1850 ml  Net -1600 ml    Physical Exam: General:  Well appearing. No resp difficulty HEENT: normal Neck: supple. no JVD. Carotids 2+ bilat; no bruits. No lymphadenopathy or thryomegaly appreciated. Cor: PMI nondisplaced. Regular rate & rhythm. No rubs, gallops or murmurs. Lungs: clear Abdomen: soft, nontender, nondistended. No hepatosplenomegaly. No bruits or masses. Good bowel sounds. Extremities: no cyanosis, clubbing, rash, edema Neuro: alert & orientedx3, cranial nerves grossly intact. moves all 4 extremities w/o difficulty. Affect pleasant   Telemetry: SR 70-80s Frequent PVCs still ~ 20/min Personally reviewed   Labs: Basic Metabolic Panel: Recent Labs  Lab 07/11/21 1401 07/11/21 2035 07/12/21 0425 07/12/21 1330 07/12/21 1422 07/12/21 1715 07/13/21 0319 07/14/21 0508 07/15/21 0252  NA 141 139 140   < > 144 136 137 137 135  K 4.1 3.9 3.8   < > 2.8* 3.9 3.8 3.9 3.7  CL 108 105 107  --   --  109 108 106 104  CO2 24 23 23   --   --  17* 21* 23 23  GLUCOSE 98 95 87  --   --  129* 97 87 80  BUN 19 15 15    --   --  15 12 10 13   CREATININE 1.32* 1.40* 1.27*  --   --  1.20 1.06 1.01 1.14  CALCIUM 9.4 9.6 9.0  --   --  8.3* 8.4* 8.6* 8.8*  MG 1.8 1.9  --   --   --   --  2.0  --   --    < > = values in this interval not displayed.    Liver Function Tests: Recent Labs  Lab 07/11/21 2035 07/13/21 0319  AST 38 23  ALT 48* 32  ALKPHOS 56 44  BILITOT 3.3* 3.8*  PROT 7.3 5.4*  ALBUMIN 4.3 3.1*   No results for input(s): LIPASE, AMYLASE in the last 168 hours. No results for input(s): AMMONIA in the last 168 hours.  CBC: Recent Labs  Lab 07/11/21 1401 07/11/21 2035 07/12/21 1330 07/12/21 1411 07/12/21 1412 07/12/21 1421 07/12/21 1422 07/12/21 1740  WBC 8.5 9.1  --   --   --   --   --   --   NEUTROABS 5.8 6.5  --   --   --   --   --   --   HGB 15.7 15.7   < > 13.9 14.6 14.3 16.0 14.4  HCT 48.1 48.5   < >  41.0 43.0 42.0 47.0 43.2  MCV 90.9 89.6  --   --   --   --   --   --   PLT 252 273  --   --   --   --   --   --    < > = values in this interval not displayed.    Cardiac Enzymes: No results for input(s): CKTOTAL, CKMB, CKMBINDEX, TROPONINI in the last 168 hours.  BNP: BNP (last 3 results) Recent Labs    07/11/21 1401  BNP 1,884.0*    ProBNP (last 3 results) No results for input(s): PROBNP in the last 8760 hours.    Other results:  Imaging: No results found.   Medications:     Scheduled Medications:  Chlorhexidine Gluconate Cloth  6 each Topical Daily   digoxin  0.125 mg Oral Daily   docusate sodium  100 mg Oral BID   empagliflozin  10 mg Oral Daily   enoxaparin (LOVENOX) injection  40 mg Subcutaneous Q24H   mexiletine  200 mg Oral BID   potassium chloride  40 mEq Oral Once   senna  1 tablet Oral Daily   sodium chloride flush  10-40 mL Intracatheter Q12H   spironolactone  12.5 mg Oral Daily    Infusions:  norepinephrine (LEVOPHED) Adult infusion Stopped (07/13/21 1651)    PRN Medications: acetaminophen, albuterol, ondansetron (ZOFRAN) IV, sodium  chloride flush, traMADol   Assessment/Plan:   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 Normal cors with distal LAD bridge. Cath complicated by severe vagal episode with near loss of pulse. - concern for familial CM. Possible contribution from frequent PVCs, Father had CM in 66s and had LVAD as bridge to eventual transplant  - Off NE - Will need trial of GDMT (+ LifeVest) but concerned that he may not respond well to GDMT given degree of LV dilation and FMHx. Life Vest ordered.  - Continue dig and Jardiance.  - Increase spiro to 25 mg daily - Hold off on entresto with soft BP.  - cMRI today.  - Blood Type O-pos - I will d/w [facility] today    2. Severe MR -  functional due to severely dilated LV    3. Frequent PVCs - TSH normal  - ~20 PVCs per minute (two primary morphologies) - May be contributing to CM. Mexilitene 200 bid started 11/12. PVC burden still ~20/min. Increase mexilitene to 300 bid. May need amio.  - keep K > 4.0 and Mg > 2.0  - outpatient sleep study to r/o OSA    Length of Stay: 4  Arvilla Meres, MD  10:14 AM   Advanced Heart Failure Team Pager [phone] (M-F; 7a - 4p)  Please contact [facility] for night-coverage after hours (4p -7a ) and weekends on [email or web address]

## 2021-07-15 NOTE — Progress Notes (Signed)
Mobility Specialist Progress Note    07/15/21 1713  Mobility  Activity Ambulated in hall  Level of Assistance Independent  Assistive Device None  Distance Ambulated (ft) 260 ft  Mobility Ambulated independently in hallway  Mobility Response Tolerated well  Mobility performed by Mobility specialist  Bed Position Chair  $Mobility charge 1 Mobility   Pt received in chair and agreeable. No complaints on walk. Returned to chair with call bell in reach.   Doyline Nation Mobility Specialist  Mobility Specialist Phone: [phone]

## 2021-07-15 NOTE — Progress Notes (Signed)
Pt transported off unit MRI. Dionne Bucy RN

## 2021-07-15 NOTE — Plan of Care (Signed)
?  Problem: Education: ?Goal: Ability to demonstrate management of disease process will improve ?Outcome: Not Progressing ?Goal: Ability to verbalize understanding of medication therapies will improve ?Outcome: Not Progressing ?  ?

## 2021-07-15 NOTE — Progress Notes (Signed)
Heart Failure Navigator Progress Note  Assessed for Heart & Vascular TOC clinic readiness.  Patient does not meet criteria due to AHF rounding team consulted this admission.   Navigator available for reassessment of patient.   Monia Timmers, MSN, RN Heart Failure Nurse Navigator [phone]   

## 2021-07-15 NOTE — Discharge Summary (Addendum)
Advanced Heart Failure Team  Discharge Summary   Patient ID: Thomas Wood MRN: [id number], DOB/AGE: 1996-02-04 25 y.o. 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 Father (Thomas Wood) with h/o cardiac arrest/HF due to NICM and has undergone VAD placement (here) and OHTx at [facility].    R/LHC showed normal coronary arteries and low output, CI 1.7. Complicated by severe vagal event and near arrest in the cath lab, resuscitated with epinephrine, NE and IVF. Transferred to ICU. Able to wean off NE. GDMT introduced. cMRI showed severe biventricular dysfunction w/ severe LV dilation and globally elevated ECV signal, w/ myocardial thinning and scar c/w a chronic process. LVEF 9%. No LV thrombus.   Had high PVC burden  >20%. Started on mexiletine with dose increased to 300 mg twice a day . PVC burden reduce.   Blood Type O-pos. Discussed with [place]. We are preparing transplant packet in anticipation of his appointment.   Discharged home w/ LifeVest. He will continue to be followed closely in the HF clinic and has follow up next week. We will continue to optimize GDMT as able.   Discharge Weight : 178.9 pounds.  Discharge Vitals: Blood pressure 109/74, pulse 86, temperature 97.8 F (36.6 C), temperature source Oral, resp. rate 18, height 5\' 11"  (1.803 m), weight 81.1 kg, SpO2 97 %.  Labs: Lab Results  Component Value Date   WBC 9.1 07/11/2021   HGB 14.4 07/12/2021   HCT 43.2 07/12/2021   MCV 89.6 07/11/2021   PLT 273 07/11/2021    Recent Labs  Lab 07/13/21 0319 07/14/21 0508 07/16/21 0430  NA 137   < > 136  K 3.8   < > 3.9  CL 108   < > 102  CO2 21*   < > 23  BUN 12   < > 17  CREATININE 1.06   < > 1.01  CALCIUM 8.4*   < > 9.1  PROT 5.4*  --    --   BILITOT 3.8*  --   --   ALKPHOS 44  --   --   ALT 32  --   --   AST 23  --   --   GLUCOSE 97   < > 74   < > = values in this interval not displayed.   Lab Results  Component Value Date   CHOL 168 07/12/2021   HDL 34 (L) 07/12/2021   LDLCALC 118 (H) 07/12/2021   TRIG 78 07/12/2021   BNP (last 3 results) Recent Labs    07/11/21 1401  BNP 1,884.0*    ProBNP (last 3 results) No results for input(s): PROBNP in the last 8760 hours.   Diagnostic Studies/Procedures   MR CARDIAC MORPHOLOGY W WO CONTRAST  Result Date: 07/15/2021 CLINICAL DATA:  Clinical question of cardiomyopathy Study assumes HCT of 43 and BSA of 2 cm2. EXAM: CARDIAC MRI TECHNIQUE: The patient was scanned on a 1.5 Tesla GE magnet. A dedicated cardiac coil was used. Functional imaging was done using Fiesta sequences. 2,3, and 4 chamber views were done to assess for RWMA's. Modified Simpson's rule using a short axis stack was used to calculate an ejection fraction on a dedicated work Conservation officer, nature. The patient received 8 cc of  Gadavist. After 10 minutes inversion recovery sequences were used to assess for infiltration and scar tissue. CONTRAST:  8 cc  of Gadavist FINDINGS: 1. Severely dilated left ventricular size, with LVEDD 89 mm, and LVEDVi 220 mL/m2. Myocardial thinning, with left ventricular thickness, with intraventricular septal thickness of 6 mm, posterior wall thickness of 5 mm, and myocardial mass index of 75 g/m2. Severely reduced left ventricular systolic function (LVEF [id number]). There is inferior and lateral akinesis in the base, mid, and apex. All other segments are severely hypokinetic. Left ventricular parametric mapping notable for diffusely elevated ECV signal (30-45%) with normal T2 signal. There is late gadolinium enhancement in the left ventricular myocardium: Thinning and transmural LGE in the inferior and lateral akinesis in the base, mid, and apex. There is no evidence of LV thrombus. 2.   Mildly increased right ventricular size with RVEDVI 120 mL/m2. Normal right ventricular thickness. Severely reduced right ventricular systolic function. There are no regional wall motion abnormalities or aneurysms. 3.  Normal right atrial size with moderate left atrial enlargement. 4.  Normal size of the aortic root, ascending aorta. Dilation of the main pulmonary artery, moderate, at 33 mm. 5. Given ECG triggering problems related to severe biventricular function with ectopy, valves are not well assessed in this study. 6.  Normal pericardium.  Small inferior pericardial effusion. 7. Grossly, no extracardiac findings. Recommended dedicated study if concerned for non-cardiac pathology. 8. Breath-hold Artifacts noted. This decreased the sensitivity of late gadolinium enhancement. Absolute volumes: LV EDV: mL (Normal 52-141 mL) LV ESV: mL (Normal 13-51 mL) LV SV: mL (Normal 33-97 mL) CO: L/min (Normal 2.7-6.0 L/min) Indexed volumes: LV EDV: mL/sq-m (Normal 41-81 mL/sq-m) LV ESV: mL/sq-m (Normal 12-21 mL/sq-m) LV SV: mL/sq-m (Normal 26-56 mL/sq-m) CI: L/min/sq-m (Normal 1.8-3.8 L/min/sq-m) Absolute volumes: LV EDV: mL (Normal 77-195 mL) LV ESV: mL (Normal 19-72 mL) LV SV: mL (Normal 51-133 mL) CO: L/min (Normal 2.8-8.8 L/min) Indexed volumes: LV EDV: mL/sq-m (Normal 47-92 mL/sq-m) LV ESV: mL/sq-m (Normal 13-30 mL/sq-m) LV SV: mL/sq-m (Normal 32-62 mL/sq-m) CI: L/min/sq-m (Normal 1.7-4.2 L/min/sq-m) Right ventricle: Absolute volumes: RV EDV: mL (Normal 58-154 mL) RV ESV: mL (Normal 12-68 mL) RV SV: mL (Normal 35-98 mL) CO: L/min (Normal 2.7-6 L/min) Indexed volumes: RV EDV: mL/sq-m (Normal 48-87 mL/sq-m) RV ESV: mL/sq-m (Normal 11-28 mL/sq-m) RV SV: mL/sq-m (Normal 27-57 mL/sq-m) CI: L/min/sq-m (Normal 1.8-3.8 L/min/sq-m) Absolute volumes: RV EDV: mL (Normal 88-227 mL) RV ESV: mL (Normal 23-103 mL) RV SV: mL (Normal 52-138 mL) CO: L/min (Normal 2.8-8.8 L/min) IMPRESSION: Severe biventricular dysfunction with severe LV  dilation and globally elevated ECV signal. Myocardial thinning and scar consistent with a chronic process. Study is consistent with dilated cardiomyopathy Rudean Haskell MD Electronically Signed   By: Rudean Haskell M.D.   On: 07/15/2021 13:06    Discharge Medications   Allergies as of 07/16/2021   No Known Allergies      Medication List     TAKE these medications    acetaminophen 500 MG tablet Commonly known as: TYLENOL Take 1,000 mg by mouth every 6 (six) hours as needed for moderate pain.   albuterol 108 (90 Base) MCG/ACT inhaler Commonly known as: VENTOLIN HFA Inhale 2 puffs into the lungs every 6 (six) hours as needed for wheezing.   aluminum chloride 20 % external solution Commonly known as: DRYSOL Apply topically at bedtime.   cetirizine 10 MG tablet Commonly known as: ZYRTEC Take 10 mg by mouth daily as needed.   digoxin 0.125 MG tablet  Commonly known as: LANOXIN Take 1 tablet (0.125 mg total) by mouth daily. Start taking on: July 17, 2021   empagliflozin 10 MG Tabs tablet Commonly known as: JARDIANCE Take 1 tablet (10 mg total) by mouth daily. Start taking on: July 17, 2021   mexiletine 150 MG capsule Commonly known as: MEXITIL Take 2 capsules (300 mg total) by mouth 2 (two) times daily.   spironolactone 25 MG tablet Commonly known as: ALDACTONE Take 1 tablet (25 mg total) by mouth daily. Start taking on: July 17, 2021               Durable Medical Equipment  (From admission, onward)           Start     Ordered   07/15/21 0909  For home use only DME Vest life vest  Once       Comments: VT 150 bpm VF 200 BPM 150Jx5 Length of need: 3 months  Start Date: 11/14   07/15/21 0909            Disposition   The patient will be discharged in stable condition to home with LIfe Vest on  Discharge Instructions     ([place]) Call MD:  Anytime you have any of the following symptoms: 1) 3 pound weight  gain in 24 hours or 5 pounds in 1 week 2) shortness of breath, with or without a dry hacking cough 3) swelling in the hands, feet or stomach 4) if you have to sleep on extra pillows at night in order to breathe.   Complete by: As directed    Diet - low sodium heart healthy   Complete by: As directed    Heart Failure patients record your daily weight using the same scale at the same time of day   Complete by: As directed    Increase activity slowly   Complete by: As directed        Follow-up Information     Harvis Mabus, Shaune Pascal, MD Follow up on 07/23/2021.   Specialty: Cardiology Why: at 3:20 . Locat Garage Code [id number] Contact information: [address] [place] [address] [phone]                   Duration of Discharge Encounter: Greater than 35 minutes   Signed, Darrick Grinder NP-C  07/16/2021, 3:17 PM  Patient seen and examined with the above-signed Advanced Practice Provider and/or Housestaff. I personally reviewed laboratory data, imaging studies and relevant notes. I independently examined the patient and formulated the important aspects of the plan. I have edited the note to reflect any of my changes or salient points. I have personally discussed the plan with the patient and/or family.  Feels better today. Denies CP or SOB. PVCs improved on higher-dose of mexilitene.  Understandably anxious about possible need for transplant.   General:  Sitting in chair . No resp difficulty HEENT: normal Neck: supple. no JVD. Carotids 2+ bilat; no bruits. No lymphadenopathy or thryomegaly appreciated. Cor: PMI laerally displaced. Regular rate & rhythm. No rubs, gallops or murmurs. Lungs: clear Abdomen: soft, nontender, nondistended. No hepatosplenomegaly. No bruits or masses. Good bowel sounds. Extremities: no cyanosis, clubbing, rash, edema Neuro: alert & orientedx3, cranial nerves grossly intact. moves all 4 extremities w/o difficulty. Affect pleasant  He  is improved but still very tenuous. I am hopeful (but not necessarily optimistic) that EF will improve some with GDMT and suppression of PVCs. Will continue to titrate  GDMT as tolerated. Place LifeVest tonight and then can d/c home. Will arrnge f/u with [place] next week and also arrange initial transplant visit at [facility].   Arvilla Meres, MD  6:49 PM

## 2021-07-16 ENCOUNTER — Other Ambulatory Visit (HOSPITAL_COMMUNITY): Payer: Self-pay

## 2021-07-16 DIAGNOSIS — I509 Heart failure, unspecified: Secondary | ICD-10-CM | POA: Diagnosis not present

## 2021-07-16 LAB — COOXEMETRY PANEL
Carboxyhemoglobin: 0.8 % (ref 0.5–1.5)
Methemoglobin: 0.6 % (ref 0.0–1.5)
O2 Saturation: 58.4 %
Total hemoglobin: 14.4 g/dL (ref 12.0–16.0)

## 2021-07-16 LAB — BASIC METABOLIC PANEL
Anion gap: 11 (ref 5–15)
BUN: 17 mg/dL (ref 6–20)
CO2: 23 mmol/L (ref 22–32)
Calcium: 9.1 mg/dL (ref 8.9–10.3)
Chloride: 102 mmol/L (ref 98–111)
Creatinine, Ser: 1.01 mg/dL (ref 0.61–1.24)
GFR, Estimated: >60 mL/min (ref >60)
Glucose, Bld: 74 mg/dL (ref 70–99)
Potassium: 3.9 mmol/L (ref 3.5–5.1)
Sodium: 136 mmol/L (ref 135–145)

## 2021-07-16 LAB — MAGNESIUM: Magnesium: 1.9 mg/dL (ref 1.7–2.4)

## 2021-07-16 MED ORDER — SPIRONOLACTONE 25 MG PO TABS
25.0000 mg | ORAL_TABLET | Freq: Every day | ORAL | 6 refills | Status: DC
  Filled 2021-07-16: qty 30, 30d supply, fill #0

## 2021-07-16 MED ORDER — DIGOXIN 125 MCG PO TABS
0.1250 mg | ORAL_TABLET | Freq: Every day | ORAL | 6 refills | Status: DC
  Filled 2021-07-16: qty 30, 30d supply, fill #0

## 2021-07-16 MED ORDER — MAGNESIUM SULFATE 2 GM/50ML IV SOLN
2.0000 g | Freq: Once | INTRAVENOUS | Status: AC
  Administered 2021-07-16: 2 g via INTRAVENOUS
  Filled 2021-07-16: qty 50

## 2021-07-16 MED ORDER — EMPAGLIFLOZIN 10 MG PO TABS
10.0000 mg | ORAL_TABLET | Freq: Every day | ORAL | 6 refills | Status: DC
  Filled 2021-07-16: qty 14, 14d supply, fill #0

## 2021-07-16 MED ORDER — POTASSIUM CHLORIDE CRYS ER 20 MEQ PO TBCR
40.0000 meq | EXTENDED_RELEASE_TABLET | Freq: Once | ORAL | Status: AC
  Administered 2021-07-16: 40 meq via ORAL
  Filled 2021-07-16: qty 4

## 2021-07-16 MED ORDER — MEXILETINE HCL 150 MG PO CAPS
300.0000 mg | ORAL_CAPSULE | Freq: Two times a day (BID) | ORAL | 6 refills | Status: DC
  Filled 2021-07-16: qty 120, 30d supply, fill #0

## 2021-07-16 NOTE — Plan of Care (Signed)
?  Problem: Education: ?Goal: Ability to demonstrate management of disease process will improve ?Outcome: Progressing ?Goal: Ability to verbalize understanding of medication therapies will improve ?Outcome: Progressing ?Goal: Individualized Educational Video(s) ?Outcome: Progressing ?  ?Problem: Activity: ?Goal: Capacity to carry out activities will improve ?Outcome: Progressing ?  ?Problem: Cardiac: ?Goal: Ability to achieve and maintain adequate cardiopulmonary perfusion will improve ?Outcome: Progressing ?  ?Problem: Education: ?Goal: Ability to demonstrate management of disease process will improve ?Outcome: Progressing ?Goal: Ability to verbalize understanding of medication therapies will improve ?Outcome: Progressing ?Goal: Individualized Educational Video(s) ?Outcome: Progressing ?  ?Problem: Activity: ?Goal: Capacity to carry out activities will improve ?Outcome: Progressing ?  ?Problem: Cardiac: ?Goal: Ability to achieve and maintain adequate cardiopulmonary perfusion will improve ?Outcome: Progressing ?  ?Problem: Education: ?Goal: Knowledge of General Education information will improve ?Description: Including pain rating scale, medication(s)/side effects and non-pharmacologic comfort measures ?Outcome: Progressing ?  ?Problem: Health Behavior/Discharge Planning: ?Goal: Ability to manage health-related needs will improve ?Outcome: Progressing ?  ?Problem: Clinical Measurements: ?Goal: Ability to maintain clinical measurements within normal limits will improve ?Outcome: Progressing ?Goal: Will remain free from infection ?Outcome: Progressing ?Goal: Diagnostic test results will improve ?Outcome: Progressing ?Goal: Respiratory complications will improve ?Outcome: Progressing ?Goal: Cardiovascular complication will be avoided ?Outcome: Progressing ?  ?Problem: Activity: ?Goal: Risk for activity intolerance will decrease ?Outcome: Progressing ?  ?Problem: Nutrition: ?Goal: Adequate nutrition will be  maintained ?Outcome: Progressing ?  ?Problem: Coping: ?Goal: Level of anxiety will decrease ?Outcome: Progressing ?  ?Problem: Elimination: ?Goal: Will not experience complications related to bowel motility ?Outcome: Progressing ?Goal: Will not experience complications related to urinary retention ?Outcome: Progressing ?  ?Problem: Pain Managment: ?Goal: General experience of comfort will improve ?Outcome: Progressing ?  ?Problem: Safety: ?Goal: Ability to remain free from injury will improve ?Outcome: Progressing ?  ?Problem: Skin Integrity: ?Goal: Risk for impaired skin integrity will decrease ?Outcome: Progressing ?  ?

## 2021-07-16 NOTE — Progress Notes (Signed)
Chaplain responded to req from pt's nurse to visit b/c pt concerned about his salvation in light of his worsening health condition.  Chaplain found two family members at bedside, one of whom said pt wanted Chaplain to pray for his salvation.  Chaplain attempted to engage with pt, inquiring if he would like to have a private conversation with the Chaplain about any important issues on his heart.  Family member informed Chaplain anything that needed to be said could be said in front of everyone present.  At that moment a nurse entered the room; followed moments later by a man carrying a Bible.  Pt and Chaplain agreed this was not an optimal time. Chaplain offered to return and pt agreed but also indicated he might be discharged tonight.  Pt's nurse confirmed pt is ready for discharge.  Family member came out of the room and advised Chaplain there was no need to return because the man with the Bible had come to pray for pt's salvation.  Chaplain is available to visit privately with pt if he is not discharged.  Vernell Morgans Chaplain

## 2021-07-16 NOTE — TOC Initial Note (Signed)
Transition of Care [facility]) - Initial/Assessment Note    Patient Details  Name: Thomas Wood MRN: [id number] Date of Birth: 1996-07-04  [facility] CM/SW Contact:    Thomas Cousin, RN Phone Number: [phone]  07/16/2021, 10:20 AM  Clinical Narrative:                  HF TOC CM spoke to pt and states he works full-time. Lives with girlfriend. Explained Life Vest will be delivered to room once insurance authorization is complete today.     Expected Discharge Plan: Home/Self Care Barriers to Discharge: No Barriers Identified   Patient Goals and CMS Choice        Expected Discharge Plan and Services Expected Discharge Plan: Home/Self Care   Discharge Planning Services: CM Consult                       DME Agency: [facility] Date DME Agency Contacted: 07/16/21 Time DME Agency Contacted: 1011 Representative spoke with at DME Agency: Thomas Wood            Prior Living Arrangements/Services   Lives with:: Significant Other Patient language and need for interpreter reviewed:: Yes Do you feel safe going back to the place where you live?: Yes      Need for Family Participation in Patient Care: No (Comment) Care giver support system in place?: No (comment)   Criminal Activity/Legal Involvement Pertinent to Current Situation/Hospitalization: No - Comment as needed  Activities of Daily Living Home Assistive Devices/Equipment: None ADL Screening (condition at time of admission) Patient's cognitive ability adequate to safely complete daily activities?: Yes Is the patient deaf or have difficulty hearing?: No Does the patient have difficulty seeing, even when wearing glasses/contacts?: No Does the patient have difficulty concentrating, remembering, or making decisions?: No Patient able to express need for assistance with ADLs?: Yes Does the patient have difficulty dressing or bathing?: No Independently performs ADLs?: Yes (appropriate for  developmental age) Does the patient have difficulty walking or climbing stairs?: No Weakness of Legs: None Weakness of Arms/Hands: None  Permission Sought/Granted Permission sought to share information with : Case Manager Permission granted to share information with : Yes, Verbal Permission Granted  Share Information with NAME: Thomas Wood  Permission granted to share info w AGENCY: DME  Permission granted to share info w Relationship: wife  Permission granted to share info w Contact Information: [phone]  Emotional Assessment       Orientation: : Oriented to Self, Oriented to Place, Oriented to  Time, Oriented to Situation   Psych Involvement: No (comment)  Admission diagnosis:  Acute heart failure, unspecified heart failure type (HCC) [I50.9] New onset of congestive heart failure (HCC) [I50.9] Shortness of breath [R06.02] Patient Active Problem List   Diagnosis Date Noted   New onset of congestive heart failure (HCC) 07/11/2021   Shortness of breath 07/11/2021   Low HDL (under 40) 09/18/2017   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 [place] [address] Phone: [phone] Fax: [phone]  Social Determinants of Health (SDOH) Interventions    Readmission Risk Interventions No flowsheet data found.   

## 2021-07-16 NOTE — TOC CM/SW Note (Signed)
HF TOC CM faxed referral to [facility] for LIve Vest.  Isidoro Donning RN3 CCM, Heart Failure TOC CM [phone]

## 2021-07-16 NOTE — Progress Notes (Signed)
Discussed with pt HF booklet including daily wts, signs of fluid, and low sodium diet. Good questions and reception. Encouraged walking as tolerated. Gave pt HF and lifevest videos to view. He will walk later. [id number] Ethelda Chick CES, ACSM 11:38 AM 07/16/2021

## 2021-07-16 NOTE — Progress Notes (Addendum)
Advanced Heart Failure Rounding Note   Subjective:    - Cath 11/11 complicated by severe vagal episode with near loss of pulse.  -11/13 Moved to 2C . Started on low dose spiro.  - 11/14 CMRI severe biventricular dysfunciton myocardial thinning and scar. LVEF 9%.  Mexiletine increased 300 mg twice a day.   CO-OX 58%   Feels ok. Says he feels a little funny about 1-1 1/2 after taking medications.    Objective:   Weight Range:  Vital Signs:   Temp:  [97.8 F (36.6 C)-98.3 F (36.8 C)] 97.8 F (36.6 C) (11/15 0745) Pulse Rate:  [61-98] 61 (11/15 0400) Resp:  [16-22] 16 (11/15 0745) BP: (93-114)/(64-88) 104/88 (11/15 0745) SpO2:  [97 %] 97 % (11/15 0745) Weight:  [81.1 kg] 81.1 kg (11/15 0239) Last BM Date: 07/14/21  Weight change: Filed Weights   07/13/21 1400 07/15/21 0309 07/16/21 0239  Weight: 83.9 kg 82.8 kg 81.1 kg    Intake/Output:   Intake/Output Summary (Last 24 hours) at 07/16/2021 0813 Last data filed at 07/15/2021 2210 Gross per 24 hour  Intake 25 ml  Output --  Net 25 ml    Physical Exam: General:   No resp difficulty HEENT: normal Neck: supple. no JVD. Carotids 2+ bilat; no bruits. No lymphadenopathy or thryomegaly appreciated. LIJ Cor: PMI nondisplaced. Regular rate & rhythm. No rubs, gallops or murmurs. Lungs: clear Abdomen: soft, nontender, nondistended. No hepatosplenomegaly. No bruits or masses. Good bowel sounds. Extremities: no cyanosis, clubbing, rash, edema Neuro: alert & orientedx3, cranial nerves grossly intact. moves all 4 extremities w/o difficulty. Affect pleasant   Telemetry: SR 70-80s with occasional PVCs. PVCs less frequent.   Labs: Basic Metabolic Panel: Recent Labs  Lab 07/11/21 1401 07/11/21 2035 07/12/21 0425 07/12/21 1715 07/13/21 0319 07/14/21 0508 07/15/21 0252 07/16/21 0430  NA 141 139   < > 136 137 137 135 136  K 4.1 3.9   < > 3.9 3.8 3.9 3.7 3.9  CL 108 105   < > 109 108 106 104 102  CO2 24 23   < > 17*  21* 23 23 23   GLUCOSE 98 95   < > 129* 97 87 80 74  BUN 19 15   < > 15 12 10 13 17   CREATININE 1.32* 1.40*   < > 1.20 1.06 1.01 1.14 1.01  CALCIUM 9.4 9.6   < > 8.3* 8.4* 8.6* 8.8* 9.1  MG 1.8 1.9  --   --  2.0  --   --  1.9   < > = values in this interval not displayed.    Liver Function Tests: Recent Labs  Lab 07/11/21 2035 07/13/21 0319  AST 38 23  ALT 48* 32  ALKPHOS 56 44  BILITOT 3.3* 3.8*  PROT 7.3 5.4*  ALBUMIN 4.3 3.1*   No results for input(s): LIPASE, AMYLASE in the last 168 hours. No results for input(s): AMMONIA in the last 168 hours.  CBC: Recent Labs  Lab 07/11/21 1401 07/11/21 2035 07/12/21 1330 07/12/21 1411 07/12/21 1412 07/12/21 1421 07/12/21 1422 07/12/21 1740  WBC 8.5 9.1  --   --   --   --   --   --   NEUTROABS 5.8 6.5  --   --   --   --   --   --   HGB 15.7 15.7   < > 13.9 14.6 14.3 16.0 14.4  HCT 48.1 48.5   < > 41.0 43.0 42.0 47.0  43.2  MCV 90.9 89.6  --   --   --   --   --   --   PLT 252 273  --   --   --   --   --   --    < > = values in this interval not displayed.    Cardiac Enzymes: No results for input(s): CKTOTAL, CKMB, CKMBINDEX, TROPONINI in the last 168 hours.  BNP: BNP (last 3 results) Recent Labs    07/11/21 1401  BNP 1,884.0*    ProBNP (last 3 results) No results for input(s): PROBNP in the last 8760 hours.    Other results:  Imaging: MR CARDIAC MORPHOLOGY W WO CONTRAST  Result Date: 07/15/2021 CLINICAL DATA:  Clinical question of cardiomyopathy Study assumes HCT of 43 and BSA of 2 cm2. EXAM: CARDIAC MRI TECHNIQUE: The patient was scanned on a 1.5 Tesla GE magnet. A dedicated cardiac coil was used. Functional imaging was done using Fiesta sequences. 2,3, and 4 chamber views were done to assess for RWMA's. Modified Simpson's rule using a short axis stack was used to calculate an ejection fraction on a dedicated work [employer]. The patient received 8 cc of Gadavist. After 10 minutes inversion  recovery sequences were used to assess for infiltration and scar tissue. CONTRAST:  8 cc  of Gadavist FINDINGS: 1. Severely dilated left ventricular size, with LVEDD 89 mm, and LVEDVi 220 mL/m2. Myocardial thinning, with left ventricular thickness, with intraventricular septal thickness of 6 mm, posterior wall thickness of 5 mm, and myocardial mass index of 75 g/m2. Severely reduced left ventricular systolic function (LVEF =9%). There is inferior and lateral akinesis in the base, mid, and apex. All other segments are severely hypokinetic. Left ventricular parametric mapping notable for diffusely elevated ECV signal (30-45%) with normal T2 signal. There is late gadolinium enhancement in the left ventricular myocardium: Thinning and transmural LGE in the inferior and lateral akinesis in the base, mid, and apex. There is no evidence of LV thrombus. 2.  Mildly increased right ventricular size with RVEDVI 120 mL/m2. Normal right ventricular thickness. Severely reduced right ventricular systolic function. There are no regional wall motion abnormalities or aneurysms. 3.  Normal right atrial size with moderate left atrial enlargement. 4.  Normal size of the aortic root, ascending aorta. Dilation of the main pulmonary artery, moderate, at 33 mm. 5. Given ECG triggering problems related to severe biventricular function with ectopy, valves are not well assessed in this study. 6.  Normal pericardium.  Small inferior pericardial effusion. 7. Grossly, no extracardiac findings. Recommended dedicated study if concerned for non-cardiac pathology. 8. Breath-hold Artifacts noted. This decreased the sensitivity of late gadolinium enhancement. Absolute volumes: LV EDV: mL (Normal 52-141 mL) LV ESV: mL (Normal 13-51 mL) LV SV: mL (Normal 33-97 mL) CO: L/min (Normal 2.7-6.0 L/min) Indexed volumes: LV EDV: mL/sq-m (Normal 41-81 mL/sq-m) LV ESV: mL/sq-m (Normal 12-21 mL/sq-m) LV SV: mL/sq-m (Normal 26-56 mL/sq-m) CI: L/min/sq-m (Normal  1.8-3.8 L/min/sq-m) Absolute volumes: LV EDV: mL (Normal 77-195 mL) LV ESV: mL (Normal 19-72 mL) LV SV: mL (Normal 51-133 mL) CO: L/min (Normal 2.8-8.8 L/min) Indexed volumes: LV EDV: mL/sq-m (Normal 47-92 mL/sq-m) LV ESV: mL/sq-m (Normal 13-30 mL/sq-m) LV SV: mL/sq-m (Normal 32-62 mL/sq-m) CI: L/min/sq-m (Normal 1.7-4.2 L/min/sq-m) Right ventricle: Absolute volumes: RV EDV: mL (Normal 58-154 mL) RV ESV: mL (Normal 12-68 mL) RV SV: mL (Normal 35-98 mL) CO: L/min (Normal 2.7-6 L/min) Indexed volumes: RV EDV: mL/sq-m (Normal 48-87 mL/sq-m) RV ESV:  mL/sq-m (Normal 11-28 mL/sq-m) RV SV: mL/sq-m (Normal 27-57 mL/sq-m) CI: L/min/sq-m (Normal 1.8-3.8 L/min/sq-m) Absolute volumes: RV EDV: mL (Normal 88-227 mL) RV ESV: mL (Normal 23-103 mL) RV SV: mL (Normal 52-138 mL) CO: L/min (Normal 2.8-8.8 L/min) IMPRESSION: Severe biventricular dysfunction with severe LV dilation and globally elevated ECV signal. Myocardial thinning and scar consistent with a chronic process. Study is consistent with dilated cardiomyopathy Riley Lam MD Electronically Signed   By: Riley Lam M.D.   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 Normal cors with distal LAD bridge. Cath complicated by severe vagal episode with near loss of  pulse. - concern for familial CM. Possible contribution from frequent PVCs, Father had CM in 21s and had LVAD as bridge to eventual transplant  - Off NE - Will need trial of GDMT (+ LifeVest) but concerned that he may not respond well to GDMT given degree of LV dilation and FMHx. Life Vest ordered.  - Volume status stable.  - No room for bb.  - Continue dig and Jardiance.  - Continue spiro to 25 mg daily - Hold off on entresto with soft BP.  - cMRI - severe biventricular dysfunciton myocardial thinning and scar. LVEF 9%  - Blood Type O-pos - Dr Gala Romney discussed with [facility]   2. Severe MR -  functional due to severely dilated LV    3. Frequent PVCs - TSH normal  - ~20 PVCs per minute (two primary morphologies) - May be contributing to CM. Mexilitene 200 bid started 11/12.  - Continue mexilitene to 300 bid. Burden reduced on high dose.   - keep K > 4.0 and Mg > 2.0  - outpatient sleep study to r/o OSA   Life Vest ordered.  Length of Stay: 5  Tonye Becket, NP  8:13 AM   Advanced Heart Failure Team Pager [phone] (M-F; 7a - 4p)  Please contact [facility] for night-coverage after hours (4p -7a ) and weekends on [email or web address]  Patient seen and examined with the above-signed Advanced Practice Provider and/or Housestaff. I personally reviewed laboratory data, imaging studies and relevant notes. I independently examined the patient and formulated the important aspects of the plan. I have edited the note to reflect any of my changes or salient points. I have personally discussed the plan with the patient and/or family.  Feels better today. Denies CP or SOB. PVCs improved on higher-dose of mexilitene.  Understandably anxious about possible need for transplant.   General:  Sitting in chair . No resp difficulty HEENT: normal Neck: supple. no JVD. Carotids 2+ bilat; no bruits. No lymphadenopathy or thryomegaly appreciated. Cor: PMI laerally displaced. Regular rate & rhythm. No rubs,  gallops or murmurs. Lungs: clear Abdomen: soft, nontender,  nondistended. No hepatosplenomegaly. No bruits or masses. Good bowel sounds. Extremities: no cyanosis, clubbing, rash, edema Neuro: alert & orientedx3, cranial nerves grossly intact. moves all 4 extremities w/o difficulty. Affect pleasant  He is improved but still very tenuous. I am hopeful (but not necessarily optimistic) that EF will improve some with GDMT and suppression of PVCs. Will continue to titrate GDMT as tolerated. Place LifeVest tonight and then can d/c home. Will arrnge f/u with [place] next week and also arrange initial transplant visit at [facility].   Arvilla Meres, MD  6:49 PM

## 2021-07-23 ENCOUNTER — Encounter (HOSPITAL_COMMUNITY): Payer: Self-pay | Admitting: Internal Medicine

## 2021-07-23 ENCOUNTER — Other Ambulatory Visit: Payer: Self-pay

## 2021-07-23 ENCOUNTER — Inpatient Hospital Stay (HOSPITAL_COMMUNITY)
Admission: EM | Admit: 2021-07-23 | Discharge: 2021-07-28 | DRG: 061 | Disposition: A | Discharge status: Home / Self Care | Payer: 59 | Attending: Internal Medicine | Admitting: Internal Medicine

## 2021-07-23 ENCOUNTER — Ambulatory Visit (HOSPITAL_BASED_OUTPATIENT_CLINIC_OR_DEPARTMENT_OTHER)
Admit: 2021-07-23 | Discharge: 2021-07-23 | Disposition: A | Discharge status: Home / Self Care | Payer: 59 | Source: Ambulatory Visit | Attending: Internal Medicine | Admitting: Internal Medicine

## 2021-07-23 VITALS — BP 102/78 | HR 104 | Wt 179.6 lb

## 2021-07-23 DIAGNOSIS — R471 Dysarthria and anarthria: Secondary | ICD-10-CM | POA: Diagnosis present

## 2021-07-23 DIAGNOSIS — Z7984 Long term (current) use of oral hypoglycemic drugs: Secondary | ICD-10-CM

## 2021-07-23 DIAGNOSIS — R4781 Slurred speech: Secondary | ICD-10-CM | POA: Diagnosis present

## 2021-07-23 DIAGNOSIS — Z87891 Personal history of nicotine dependence: Secondary | ICD-10-CM

## 2021-07-23 DIAGNOSIS — I110 Hypertensive heart disease with heart failure: Secondary | ICD-10-CM | POA: Diagnosis present

## 2021-07-23 DIAGNOSIS — F419 Anxiety disorder, unspecified: Secondary | ICD-10-CM | POA: Diagnosis present

## 2021-07-23 DIAGNOSIS — Z79899 Other long term (current) drug therapy: Secondary | ICD-10-CM

## 2021-07-23 DIAGNOSIS — F40248 Other situational type phobia: Secondary | ICD-10-CM | POA: Insufficient documentation

## 2021-07-23 DIAGNOSIS — M7981 Nontraumatic hematoma of soft tissue: Secondary | ICD-10-CM | POA: Diagnosis not present

## 2021-07-23 DIAGNOSIS — I472 Ventricular tachycardia, unspecified: Secondary | ICD-10-CM | POA: Diagnosis not present

## 2021-07-23 DIAGNOSIS — I340 Nonrheumatic mitral (valve) insufficiency: Secondary | ICD-10-CM | POA: Insufficient documentation

## 2021-07-23 DIAGNOSIS — I493 Ventricular premature depolarization: Secondary | ICD-10-CM | POA: Insufficient documentation

## 2021-07-23 DIAGNOSIS — Z8249 Family history of ischemic heart disease and other diseases of the circulatory system: Secondary | ICD-10-CM

## 2021-07-23 DIAGNOSIS — I509 Heart failure, unspecified: Secondary | ICD-10-CM

## 2021-07-23 DIAGNOSIS — I9589 Other hypotension: Secondary | ICD-10-CM | POA: Diagnosis present

## 2021-07-23 DIAGNOSIS — E785 Hyperlipidemia, unspecified: Secondary | ICD-10-CM | POA: Diagnosis present

## 2021-07-23 DIAGNOSIS — R29703 NIHSS score 3: Secondary | ICD-10-CM | POA: Diagnosis present

## 2021-07-23 DIAGNOSIS — Z8673 Personal history of transient ischemic attack (TIA), and cerebral infarction without residual deficits: Secondary | ICD-10-CM

## 2021-07-23 DIAGNOSIS — E119 Type 2 diabetes mellitus without complications: Secondary | ICD-10-CM | POA: Diagnosis present

## 2021-07-23 DIAGNOSIS — K219 Gastro-esophageal reflux disease without esophagitis: Secondary | ICD-10-CM | POA: Diagnosis present

## 2021-07-23 DIAGNOSIS — I5021 Acute systolic (congestive) heart failure: Secondary | ICD-10-CM | POA: Insufficient documentation

## 2021-07-23 DIAGNOSIS — J45909 Unspecified asthma, uncomplicated: Secondary | ICD-10-CM | POA: Diagnosis present

## 2021-07-23 DIAGNOSIS — I50814 Right heart failure due to left heart failure: Secondary | ICD-10-CM | POA: Insufficient documentation

## 2021-07-23 DIAGNOSIS — H547 Unspecified visual loss: Secondary | ICD-10-CM | POA: Diagnosis present

## 2021-07-23 DIAGNOSIS — I5082 Biventricular heart failure: Secondary | ICD-10-CM | POA: Diagnosis present

## 2021-07-23 DIAGNOSIS — I639 Cerebral infarction, unspecified: Secondary | ICD-10-CM

## 2021-07-23 DIAGNOSIS — Z20822 Contact with and (suspected) exposure to covid-19: Secondary | ICD-10-CM | POA: Diagnosis not present

## 2021-07-23 DIAGNOSIS — I6302 Cerebral infarction due to thrombosis of basilar artery: Principal | ICD-10-CM | POA: Diagnosis present

## 2021-07-23 DIAGNOSIS — Z825 Family history of asthma and other chronic lower respiratory diseases: Secondary | ICD-10-CM

## 2021-07-23 DIAGNOSIS — I651 Occlusion and stenosis of basilar artery: Secondary | ICD-10-CM | POA: Diagnosis present

## 2021-07-23 DIAGNOSIS — I420 Dilated cardiomyopathy: Secondary | ICD-10-CM | POA: Diagnosis present

## 2021-07-23 LAB — COMPREHENSIVE METABOLIC PANEL
ALT: 43 U/L (ref 0–44)
AST: 31 U/L (ref 15–41)
Albumin: 4.3 g/dL (ref 3.5–5.0)
Alkaline Phosphatase: 62 U/L (ref 38–126)
Anion gap: 9 (ref 5–15)
BUN: 25 mg/dL — ABNORMAL HIGH (ref 6–20)
CO2: 22 mmol/L (ref 22–32)
Calcium: 9.7 mg/dL (ref 8.9–10.3)
Chloride: 105 mmol/L (ref 98–111)
Creatinine, Ser: 1.37 mg/dL — ABNORMAL HIGH (ref 0.61–1.24)
GFR, Estimated: >60 mL/min (ref >60)
Glucose, Bld: 103 mg/dL — ABNORMAL HIGH (ref 70–99)
Potassium: 4.7 mmol/L (ref 3.5–5.1)
Sodium: 136 mmol/L (ref 135–145)
Total Bilirubin: 1.2 mg/dL (ref 0.3–1.2)
Total Protein: 7.6 g/dL (ref 6.5–8.1)

## 2021-07-23 LAB — BRAIN NATRIURETIC PEPTIDE: B Natriuretic Peptide: 716.6 pg/mL — ABNORMAL HIGH (ref 0.0–100.0)

## 2021-07-23 MED ORDER — LOSARTAN POTASSIUM 25 MG PO TABS: 12.5000 mg | ORAL_TABLET | Freq: Every day | ORAL | 1 refills | Status: DC

## 2021-07-23 NOTE — Patient Instructions (Signed)
Start Losartan 12.5 mg (1/2 Tablet) at bedtime.  Labs done today, your results will be available in MyChart, we will contact you for abnormal readings.  Your physician recommends that you schedule a follow-up appointment in: 4-6 weeks with Echocardiogram.  If you have any questions or concerns before your next appointment please send [place] a message through [place] or call our office at [phone].    TO LEAVE A MESSAGE FOR THE NURSE SELECT OPTION 2, PLEASE LEAVE A MESSAGE INCLUDING: YOUR NAME DATE OF BIRTH CALL BACK NUMBER REASON FOR CALL**this is important as we prioritize the call backs  YOU WILL RECEIVE A CALL BACK THE SAME DAY AS LONG AS YOU CALL BEFORE 4:00 PM  At the [facility], you and your health needs are our priority. As part of our continuing mission to provide you with exceptional heart care, we have created designated Provider Care Teams. These Care Teams include your primary Cardiologist (physician) and Advanced Practice Providers (APPs- Physician Assistants and Nurse Practitioners) who all work together to provide you with the care you need, when you need it.   You may see any of the following providers on your designated Care Team at your next follow up: Dr Arvilla Meres Dr Carron Curie, NP Robbie Lis, [place] [facility] [place], [place] Karle Plumber, PharmD   Please be sure to bring in all your medications bottles to every appointment.

## 2021-07-23 NOTE — Progress Notes (Signed)
[facility] CLINIC NOTE  PCP:Pcp, No Primary Cardiologist: DB  HPI:  [name] is a 25 y/o male with h/o morbid obesity s/p marked weight loss admitted in 11/22 with acute systolic HF EF 10-15% with LVIDd > 7cm. Father (Donal Lorrin Goodell) with h/o cardiac arrest/HF due to NICM and has undergone VAD placement (here) and OHTx at [facility].    R/LHC showed normal coronary arteries and low output, CI 1.7. Complicated by severe vagal event and near arrest in the cath lab, resuscitated with epinephrine, NE and IVF. Transferred to ICU. Able to wean off NE. GDMT introduced. cMRI showed severe biventricular dysfunction w/ severe LV dilation and globally elevated ECV signal, w/ myocardial thinning and scar c/w a chronic process. LVEF 9%. RVEF 2% No LV thrombus.    Had high PVC burden  >20%. Started on mexiletine with dose increased to 300 mg twice a day . PVC burden reduced. Discharged home w/ LifeVest.   Here for post-hospital f/u. Feels great. Walking around grocery store for 30-40 mins without a problem. No edema, orthopnea or edema. No LifeVest firings. Compliant with meds. Was dizzy at first with meds but now better.   ROS: All systems negative except as listed in HPI, PMH and Problem List.  SH:  Social History   Socioeconomic History   Marital status: Single    Spouse name: Not on file   Number of children: Not on file   Years of education: Not on file   Highest education level: Not on file  Occupational History   Not on file  Tobacco Use   Smoking status: Never   Smokeless tobacco: Never  Substance and Sexual Activity   Alcohol use: No   Drug use: No   Sexual activity: Yes    Birth control/protection: Pill  Other Topics Concern   Not on file  Social History Narrative   Not on file   Social Determinants of Health   Financial Resource Strain: Not on file  Food Insecurity: Not on file  Transportation Needs: Not on file  Physical Activity: Not on file  Stress: Not on file  Social  Connections: Not on file  Intimate Partner Violence: Not on file    FH:  Family History  Problem Relation Age of Onset   Asthma Mother    Cancer Mother        breast   Miscarriages / [place] Mother    Kidney disease Mother        stones   Diabetes Father    Heart disease Father 26        MI   Heart failure Father        LVAD>>Heart Transplant (63s)   Asthma Brother     Past Medical History:  Diagnosis Date   Asthma    Chronic kidney disease    stones   GERD (gastroesophageal reflux disease)    New onset of congestive heart failure (HCC) 07/11/2021    Current Outpatient Medications  Medication Sig Dispense Refill   acetaminophen (TYLENOL) 500 MG tablet Take 1,000 mg by mouth every 6 (six) hours as needed for moderate pain.     albuterol (VENTOLIN HFA) 108 (90 Base) MCG/ACT inhaler Inhale 2 puffs into the lungs every 6 (six) hours as needed for wheezing.     cetirizine (ZYRTEC) 10 MG tablet Take 10 mg by mouth daily as needed.     digoxin (LANOXIN) 0.125 MG tablet Take 1 tablet (0.125 mg total) by mouth daily. 30 tablet 6  empagliflozin (JARDIANCE) 10 MG TABS tablet Take 1 tablet (10 mg total) by mouth daily. 30 tablet 6   mexiletine (MEXITIL) 150 MG capsule Take 2 capsules (300 mg total) by mouth 2 (two) times daily. 120 capsule 6   spironolactone (ALDACTONE) 25 MG tablet Take 1 tablet (25 mg total) by mouth daily. 30 tablet 6   No current facility-administered medications for this encounter.    Vitals:   07/23/21 1539  BP: 102/78  Pulse: (!) 104  SpO2: 97%  Weight: 81.5 kg (179 lb 9.6 oz)    PHYSICAL EXAM:  General:  Well appearing. No resp difficulty HEENT: normal Neck: supple. no JVD. Carotids 2+ bilat; no bruits. No lymphadenopathy or thryomegaly appreciated. Cor: PMI nondisplaced. Regular rate & rhythm. No rubs, gallops or murmurs. Lungs: clear Abdomen: soft, nontender, nondistended. No hepatosplenomegaly. No bruits or masses. Good bowel  sounds. Extremities: no cyanosis, clubbing, rash, edema Neuro: alert & orientedx3, cranial nerves grossly intact. moves all 4 extremities w/o difficulty. Affect pleasant   ECG: NSR 97 QRS 72ms  no PVCs Personally reviewed    ASSESSMENT & PLAN:  1. 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 Cath complicated by severe vagal episode with near loss of pulse. - cMRI - severe biventricular dysfunciton myocardial thinning and scar. LVEF 9% RVEF 2% (yes 2%) - Concern for familial CM. Possible contribution from frequent PVCs, Father had CM in 55s and had LVAD as bridge to eventual transplant  - Improved NYHA II-III Volume status looks good  - Continue dig 0.125 - Continue Jardiance 10 - Continue spiro 25 mg daily - Start losartan 12,5mg  qhs  - Not on b-blocker yet with recent low output - Blood Type O-pos - Check labs - Will see him back in 4-6 weeks with repeat echo if EF improving with PVC suppression will continue to pursue GDMT titration in hopes for LV recovery. If no improvement will refer to [facility] to get him plugged in for possible transplant w/u   2. Severe MR -  functional due to severely dilated LV    3. Frequent PVCs - TSH normal  - ~20 PVCs per minute (two primary morphologies) - Mexilitene 300 bid started 11/12. PVCs completely suppressed on ECG and exam today - outpatient sleep study to r/o OSA   4. Situational anxiety - we discussed possible SSRI. Wants to hold off for now. Can start sertraline as needed  Arvilla Meres, MD  4:17 PM

## 2021-07-23 NOTE — ED Triage Notes (Signed)
Pt c/o facial numbness after taking his new medication (losartan) tonight around 2200.

## 2021-07-24 ENCOUNTER — Inpatient Hospital Stay (HOSPITAL_COMMUNITY): Payer: 59

## 2021-07-24 ENCOUNTER — Other Ambulatory Visit: Payer: Self-pay

## 2021-07-24 ENCOUNTER — Encounter (HOSPITAL_COMMUNITY)
Admission: EM | Disposition: A | Discharge status: Home / Self Care | Payer: Self-pay | Source: Home / Self Care | Attending: Neurology

## 2021-07-24 ENCOUNTER — Emergency Department (HOSPITAL_COMMUNITY): Payer: 59

## 2021-07-24 ENCOUNTER — Emergency Department (HOSPITAL_COMMUNITY): Payer: 59 | Admitting: Certified Registered"

## 2021-07-24 ENCOUNTER — Encounter (HOSPITAL_COMMUNITY): Payer: Self-pay | Admitting: Certified Registered"

## 2021-07-24 ENCOUNTER — Encounter (HOSPITAL_COMMUNITY): Payer: Self-pay | Admitting: *Deleted

## 2021-07-24 DIAGNOSIS — I5020 Unspecified systolic (congestive) heart failure: Secondary | ICD-10-CM | POA: Diagnosis not present

## 2021-07-24 DIAGNOSIS — Z20822 Contact with and (suspected) exposure to covid-19: Secondary | ICD-10-CM | POA: Diagnosis present

## 2021-07-24 DIAGNOSIS — I651 Occlusion and stenosis of basilar artery: Secondary | ICD-10-CM | POA: Diagnosis present

## 2021-07-24 DIAGNOSIS — I6389 Other cerebral infarction: Secondary | ICD-10-CM | POA: Diagnosis not present

## 2021-07-24 DIAGNOSIS — E119 Type 2 diabetes mellitus without complications: Secondary | ICD-10-CM | POA: Diagnosis present

## 2021-07-24 DIAGNOSIS — Z79899 Other long term (current) drug therapy: Secondary | ICD-10-CM | POA: Diagnosis not present

## 2021-07-24 DIAGNOSIS — I6302 Cerebral infarction due to thrombosis of basilar artery: Secondary | ICD-10-CM | POA: Diagnosis present

## 2021-07-24 DIAGNOSIS — H547 Unspecified visual loss: Secondary | ICD-10-CM | POA: Diagnosis present

## 2021-07-24 DIAGNOSIS — I639 Cerebral infarction, unspecified: Secondary | ICD-10-CM | POA: Diagnosis not present

## 2021-07-24 DIAGNOSIS — Z825 Family history of asthma and other chronic lower respiratory diseases: Secondary | ICD-10-CM | POA: Diagnosis not present

## 2021-07-24 DIAGNOSIS — I5082 Biventricular heart failure: Secondary | ICD-10-CM | POA: Diagnosis not present

## 2021-07-24 DIAGNOSIS — Z8673 Personal history of transient ischemic attack (TIA), and cerebral infarction without residual deficits: Secondary | ICD-10-CM | POA: Diagnosis not present

## 2021-07-24 DIAGNOSIS — Z7984 Long term (current) use of oral hypoglycemic drugs: Secondary | ICD-10-CM | POA: Diagnosis not present

## 2021-07-24 DIAGNOSIS — I493 Ventricular premature depolarization: Secondary | ICD-10-CM | POA: Diagnosis not present

## 2021-07-24 DIAGNOSIS — J45909 Unspecified asthma, uncomplicated: Secondary | ICD-10-CM | POA: Diagnosis present

## 2021-07-24 DIAGNOSIS — I5021 Acute systolic (congestive) heart failure: Secondary | ICD-10-CM | POA: Diagnosis present

## 2021-07-24 DIAGNOSIS — K219 Gastro-esophageal reflux disease without esophagitis: Secondary | ICD-10-CM | POA: Diagnosis present

## 2021-07-24 DIAGNOSIS — R4781 Slurred speech: Secondary | ICD-10-CM | POA: Diagnosis present

## 2021-07-24 DIAGNOSIS — E785 Hyperlipidemia, unspecified: Secondary | ICD-10-CM | POA: Diagnosis present

## 2021-07-24 DIAGNOSIS — I724 Aneurysm of artery of lower extremity: Secondary | ICD-10-CM | POA: Diagnosis not present

## 2021-07-24 DIAGNOSIS — I9589 Other hypotension: Secondary | ICD-10-CM | POA: Diagnosis present

## 2021-07-24 DIAGNOSIS — I429 Cardiomyopathy, unspecified: Secondary | ICD-10-CM | POA: Diagnosis not present

## 2021-07-24 DIAGNOSIS — Z87891 Personal history of nicotine dependence: Secondary | ICD-10-CM | POA: Diagnosis not present

## 2021-07-24 DIAGNOSIS — Z8249 Family history of ischemic heart disease and other diseases of the circulatory system: Secondary | ICD-10-CM | POA: Diagnosis not present

## 2021-07-24 DIAGNOSIS — I472 Ventricular tachycardia, unspecified: Secondary | ICD-10-CM | POA: Diagnosis not present

## 2021-07-24 DIAGNOSIS — M7981 Nontraumatic hematoma of soft tissue: Secondary | ICD-10-CM | POA: Diagnosis not present

## 2021-07-24 DIAGNOSIS — I5022 Chronic systolic (congestive) heart failure: Secondary | ICD-10-CM | POA: Diagnosis not present

## 2021-07-24 DIAGNOSIS — I420 Dilated cardiomyopathy: Secondary | ICD-10-CM | POA: Diagnosis present

## 2021-07-24 DIAGNOSIS — F419 Anxiety disorder, unspecified: Secondary | ICD-10-CM | POA: Diagnosis present

## 2021-07-24 DIAGNOSIS — I110 Hypertensive heart disease with heart failure: Secondary | ICD-10-CM | POA: Diagnosis present

## 2021-07-24 DIAGNOSIS — R29703 NIHSS score 3: Secondary | ICD-10-CM | POA: Diagnosis present

## 2021-07-24 HISTORY — PX: IR ANGIO VERTEBRAL SEL VERTEBRAL BILAT MOD SED: IMG5369

## 2021-07-24 HISTORY — PX: RADIOLOGY WITH ANESTHESIA: SHX6223

## 2021-07-24 HISTORY — DX: Cerebral infarction due to thrombosis of basilar artery: I63.02

## 2021-07-24 HISTORY — PX: IR ANGIO INTRA EXTRACRAN SEL COM CAROTID INNOMINATE BILAT MOD SED: IMG5360

## 2021-07-24 LAB — ECHOCARDIOGRAM COMPLETE
Area-P 1/2: 5.42 cm2
Calc EF: 19.8 %
Height: 71 in
S' Lateral: 7 cm
Single Plane A2C EF: 21.1 %
Single Plane A4C EF: 19 %
Weight: 2832 oz

## 2021-07-24 LAB — COMPREHENSIVE METABOLIC PANEL
ALT: 42 U/L (ref 0–44)
AST: 31 U/L (ref 15–41)
Albumin: 4.2 g/dL (ref 3.5–5.0)
Alkaline Phosphatase: 52 U/L (ref 38–126)
Anion gap: 12 (ref 5–15)
BUN: 25 mg/dL — ABNORMAL HIGH (ref 6–20)
CO2: 19 mmol/L — ABNORMAL LOW (ref 22–32)
Calcium: 9.8 mg/dL (ref 8.9–10.3)
Chloride: 103 mmol/L (ref 98–111)
Creatinine, Ser: 1.37 mg/dL — ABNORMAL HIGH (ref 0.61–1.24)
GFR, Estimated: >60 mL/min (ref >60)
Glucose, Bld: 176 mg/dL — ABNORMAL HIGH (ref 70–99)
Potassium: 4 mmol/L (ref 3.5–5.1)
Sodium: 134 mmol/L — ABNORMAL LOW (ref 135–145)
Total Bilirubin: 1.3 mg/dL — ABNORMAL HIGH (ref 0.3–1.2)
Total Protein: 7.3 g/dL (ref 6.5–8.1)

## 2021-07-24 LAB — BASIC METABOLIC PANEL
Anion gap: 9 (ref 5–15)
BUN: 17 mg/dL (ref 6–20)
CO2: 21 mmol/L — ABNORMAL LOW (ref 22–32)
Calcium: 9.1 mg/dL (ref 8.9–10.3)
Chloride: 107 mmol/L (ref 98–111)
Creatinine, Ser: 1.09 mg/dL (ref 0.61–1.24)
GFR, Estimated: >60 mL/min (ref >60)
Glucose, Bld: 93 mg/dL (ref 70–99)
Potassium: 3.8 mmol/L (ref 3.5–5.1)
Sodium: 137 mmol/L (ref 135–145)

## 2021-07-24 LAB — URINALYSIS, ROUTINE W REFLEX MICROSCOPIC
Bacteria, UA: NONE SEEN
Bilirubin Urine: NEGATIVE
Glucose, UA: >=500 mg/dL — AB
Ketones, ur: 5 mg/dL — AB
Leukocytes,Ua: NEGATIVE
Nitrite: NEGATIVE
Protein, ur: NEGATIVE mg/dL
Specific Gravity, Urine: >1.046 — ABNORMAL HIGH (ref 1.005–1.030)
pH: 5 (ref 5.0–8.0)

## 2021-07-24 LAB — CBC
HCT: 44.9 % (ref 39.0–52.0)
Hemoglobin: 15.3 g/dL (ref 13.0–17.0)
MCH: 30.1 pg (ref 26.0–34.0)
MCHC: 34.1 g/dL (ref 30.0–36.0)
MCV: 88.4 fL (ref 80.0–100.0)
Platelets: 289 10*3/uL (ref 150–400)
RBC: 5.08 MIL/uL (ref 4.22–5.81)
RDW: 12.3 % (ref 11.5–15.5)
WBC: 13.4 10*3/uL — ABNORMAL HIGH (ref 4.0–10.5)
nRBC: 0 % (ref 0.0–0.2)

## 2021-07-24 LAB — CBC WITH DIFFERENTIAL/PLATELET
Abs Immature Granulocytes: 0.03 10*3/uL (ref 0.00–0.07)
Basophils Absolute: 0 10*3/uL (ref 0.0–0.1)
Basophils Relative: 0 %
Eosinophils Absolute: 0 10*3/uL (ref 0.0–0.5)
Eosinophils Relative: 0 %
HCT: 42.1 % (ref 39.0–52.0)
Hemoglobin: 14.3 g/dL (ref 13.0–17.0)
Immature Granulocytes: 0 %
Lymphocytes Relative: 16 %
Lymphs Abs: 1.6 10*3/uL (ref 0.7–4.0)
MCH: 29.9 pg (ref 26.0–34.0)
MCHC: 34 g/dL (ref 30.0–36.0)
MCV: 87.9 fL (ref 80.0–100.0)
Monocytes Absolute: 0.8 10*3/uL (ref 0.1–1.0)
Monocytes Relative: 9 %
Neutro Abs: 7.3 10*3/uL (ref 1.7–7.7)
Neutrophils Relative %: 75 %
Platelets: 282 10*3/uL (ref 150–400)
RBC: 4.79 MIL/uL (ref 4.22–5.81)
RDW: 12.4 % (ref 11.5–15.5)
WBC: 9.8 10*3/uL (ref 4.0–10.5)
nRBC: 0 % (ref 0.0–0.2)

## 2021-07-24 LAB — RAPID URINE DRUG SCREEN, HOSP PERFORMED
Amphetamines: NOT DETECTED
Barbiturates: NOT DETECTED
Benzodiazepines: NOT DETECTED
Cocaine: NOT DETECTED
Opiates: NOT DETECTED
Tetrahydrocannabinol: POSITIVE — AB

## 2021-07-24 LAB — RESP PANEL BY RT-PCR (FLU A&B, COVID) ARPGX2
Influenza A by PCR: NEGATIVE
Influenza B by PCR: NEGATIVE
SARS Coronavirus 2 by RT PCR: NEGATIVE

## 2021-07-24 LAB — LIPID PANEL
Cholesterol: 159 mg/dL (ref 0–200)
HDL: 35 mg/dL — ABNORMAL LOW (ref >40)
LDL Cholesterol: 114 mg/dL — ABNORMAL HIGH (ref 0–99)
Total CHOL/HDL Ratio: 4.5 RATIO
Triglycerides: 50 mg/dL (ref <150)
VLDL: 10 mg/dL (ref 0–40)

## 2021-07-24 LAB — DIFFERENTIAL
Abs Immature Granulocytes: 0.04 10*3/uL (ref 0.00–0.07)
Basophils Absolute: 0 10*3/uL (ref 0.0–0.1)
Basophils Relative: 0 %
Eosinophils Absolute: 0 10*3/uL (ref 0.0–0.5)
Eosinophils Relative: 0 %
Immature Granulocytes: 0 %
Lymphocytes Relative: 8 %
Lymphs Abs: 1 10*3/uL (ref 0.7–4.0)
Monocytes Absolute: 0.3 10*3/uL (ref 0.1–1.0)
Monocytes Relative: 2 %
Neutro Abs: 12 10*3/uL — ABNORMAL HIGH (ref 1.7–7.7)
Neutrophils Relative %: 90 %

## 2021-07-24 LAB — POCT I-STAT, CHEM 8
BUN: 26 mg/dL — ABNORMAL HIGH (ref 6–20)
Calcium, Ion: 1.16 mmol/L (ref 1.15–1.40)
Chloride: 104 mmol/L (ref 98–111)
Creatinine, Ser: 1.3 mg/dL — ABNORMAL HIGH (ref 0.61–1.24)
Glucose, Bld: 171 mg/dL — ABNORMAL HIGH (ref 70–99)
HCT: 49 % (ref 39.0–52.0)
Hemoglobin: 16.7 g/dL (ref 13.0–17.0)
Potassium: 3.9 mmol/L (ref 3.5–5.1)
Sodium: 137 mmol/L (ref 135–145)
TCO2: 21 mmol/L — ABNORMAL LOW (ref 22–32)

## 2021-07-24 LAB — HEMOGLOBIN A1C
Hgb A1c MFr Bld: 4.9 % (ref 4.8–5.6)
Mean Plasma Glucose: 93.93 mg/dL

## 2021-07-24 LAB — ETHANOL: Alcohol, Ethyl (B): <10 mg/dL (ref <10)

## 2021-07-24 LAB — MRSA NEXT GEN BY PCR, NASAL: MRSA by PCR Next Gen: NOT DETECTED

## 2021-07-24 LAB — PROTIME-INR
INR: 1.1 (ref 0.8–1.2)
Prothrombin Time: 14.4 seconds (ref 11.4–15.2)

## 2021-07-24 LAB — APTT: aPTT: 26 seconds (ref 24–36)

## 2021-07-24 SURGERY — RADIOLOGY WITH ANESTHESIA
Anesthesia: General

## 2021-07-24 MED ORDER — CEFAZOLIN SODIUM-DEXTROSE 2-4 GM/100ML-% IV SOLN
INTRAVENOUS | Status: AC
  Filled 2021-07-24: qty 100

## 2021-07-24 MED ORDER — TENECTEPLASE FOR STROKE
0.2500 mg/kg | PACK | Freq: Once | INTRAVENOUS | Status: AC
  Administered 2021-07-24: 20 mg via INTRAVENOUS
  Filled 2021-07-24: qty 10

## 2021-07-24 MED ORDER — FENTANYL CITRATE (PF) 100 MCG/2ML IJ SOLN
INTRAMUSCULAR | Status: AC
  Filled 2021-07-24: qty 4

## 2021-07-24 MED ORDER — DIGOXIN 125 MCG PO TABS
0.1250 mg | ORAL_TABLET | Freq: Every day | ORAL | Status: DC
  Administered 2021-07-24 – 2021-07-28 (×5): 0.125 mg via ORAL
  Filled 2021-07-24 (×5): qty 1

## 2021-07-24 MED ORDER — SUGAMMADEX SODIUM 200 MG/2ML IV SOLN
INTRAVENOUS | Status: DC | PRN
  Administered 2021-07-24: 200 mg via INTRAVENOUS

## 2021-07-24 MED ORDER — CHLORHEXIDINE GLUCONATE CLOTH 2 % EX PADS
6.0000 | MEDICATED_PAD | Freq: Every day | CUTANEOUS | Status: DC
  Administered 2021-07-25 – 2021-07-26 (×2): 6 via TOPICAL

## 2021-07-24 MED ORDER — PHENYLEPHRINE 40 MCG/ML (10ML) SYRINGE FOR IV PUSH (FOR BLOOD PRESSURE SUPPORT)
PREFILLED_SYRINGE | INTRAVENOUS | Status: DC | PRN
  Administered 2021-07-24: 80 ug via INTRAVENOUS

## 2021-07-24 MED ORDER — CEFAZOLIN SODIUM-DEXTROSE 2-3 GM-%(50ML) IV SOLR
INTRAVENOUS | Status: DC | PRN
  Administered 2021-07-24: 2 g via INTRAVENOUS

## 2021-07-24 MED ORDER — SODIUM CHLORIDE 0.9 % IV SOLN: INTRAVENOUS | Status: AC

## 2021-07-24 MED ORDER — FENTANYL CITRATE (PF) 100 MCG/2ML IJ SOLN
INTRAMUSCULAR | Status: DC | PRN
  Administered 2021-07-24: 100 ug via INTRAVENOUS

## 2021-07-24 MED ORDER — ACETAMINOPHEN 160 MG/5ML PO SOLN: 650.0000 mg | ORAL | Status: DC | PRN

## 2021-07-24 MED ORDER — STROKE: EARLY STAGES OF RECOVERY BOOK
Freq: Once | Status: DC
  Filled 2021-07-24: qty 1

## 2021-07-24 MED ORDER — LACTATED RINGERS IV SOLN: INTRAVENOUS | Status: DC | PRN

## 2021-07-24 MED ORDER — ONDANSETRON HCL 4 MG/2ML IJ SOLN
INTRAMUSCULAR | Status: DC | PRN
  Administered 2021-07-24: 4 mg via INTRAVENOUS

## 2021-07-24 MED ORDER — IOHEXOL 350 MG/ML SOLN
60.0000 mL | Freq: Once | INTRAVENOUS | Status: AC | PRN
  Administered 2021-07-24: 60 mL via INTRAVENOUS

## 2021-07-24 MED ORDER — PANTOPRAZOLE SODIUM 40 MG IV SOLR
40.0000 mg | Freq: Every day | INTRAVENOUS | Status: DC
  Administered 2021-07-25 – 2021-07-27 (×4): 40 mg via INTRAVENOUS
  Filled 2021-07-24 (×4): qty 40

## 2021-07-24 MED ORDER — APIXABAN 5 MG PO TABS: 5.0000 mg | ORAL_TABLET | Freq: Two times a day (BID) | ORAL | Status: DC

## 2021-07-24 MED ORDER — ATORVASTATIN CALCIUM 40 MG PO TABS
40.0000 mg | ORAL_TABLET | Freq: Every day | ORAL | Status: DC
  Administered 2021-07-24 – 2021-07-28 (×5): 40 mg via ORAL
  Filled 2021-07-24 (×5): qty 1

## 2021-07-24 MED ORDER — ACETAMINOPHEN 650 MG RE SUPP: 650.0000 mg | RECTAL | Status: DC | PRN

## 2021-07-24 MED ORDER — ETOMIDATE 2 MG/ML IV SOLN
INTRAVENOUS | Status: DC | PRN
  Administered 2021-07-24: 10 mg via INTRAVENOUS

## 2021-07-24 MED ORDER — PHENYLEPHRINE HCL-NACL 20-0.9 MG/250ML-% IV SOLN
INTRAVENOUS | Status: DC | PRN
  Administered 2021-07-24: 50 ug/min via INTRAVENOUS

## 2021-07-24 MED ORDER — ACETAMINOPHEN 325 MG PO TABS: 650.0000 mg | ORAL_TABLET | ORAL | Status: DC | PRN

## 2021-07-24 MED ORDER — NITROGLYCERIN 1 MG/10 ML FOR IR/CATH LAB
INTRA_ARTERIAL | Status: AC
  Filled 2021-07-24: qty 10

## 2021-07-24 MED ORDER — ROCURONIUM BROMIDE 100 MG/10ML IV SOLN
INTRAVENOUS | Status: DC | PRN
  Administered 2021-07-24: 30 mg via INTRAVENOUS

## 2021-07-24 MED ORDER — ACETAMINOPHEN 325 MG PO TABS
650.0000 mg | ORAL_TABLET | ORAL | Status: DC | PRN
  Administered 2021-07-24 – 2021-07-25 (×3): 650 mg via ORAL
  Filled 2021-07-24 (×3): qty 2

## 2021-07-24 MED ORDER — LIDOCAINE HCL (CARDIAC) PF 100 MG/5ML IV SOSY
PREFILLED_SYRINGE | INTRAVENOUS | Status: DC | PRN
  Administered 2021-07-24: 60 mg via INTRATRACHEAL

## 2021-07-24 MED ORDER — PERFLUTREN LIPID MICROSPHERE
1.0000 mL | INTRAVENOUS | Status: AC | PRN
Start: 2021-07-24 — End: 2021-07-24
  Administered 2021-07-24: 2 mL via INTRAVENOUS
  Filled 2021-07-24: qty 10

## 2021-07-24 MED ORDER — SENNOSIDES-DOCUSATE SODIUM 8.6-50 MG PO TABS: 1.0000 | ORAL_TABLET | Freq: Every evening | ORAL | Status: DC | PRN

## 2021-07-24 MED ORDER — EMPAGLIFLOZIN 10 MG PO TABS
10.0000 mg | ORAL_TABLET | Freq: Every day | ORAL | Status: DC
  Administered 2021-07-24 – 2021-07-28 (×5): 10 mg via ORAL
  Filled 2021-07-24 (×5): qty 1

## 2021-07-24 MED ORDER — SUCCINYLCHOLINE CHLORIDE 200 MG/10ML IV SOSY
PREFILLED_SYRINGE | INTRAVENOUS | Status: DC | PRN
  Administered 2021-07-24: 100 mg via INTRAVENOUS

## 2021-07-24 MED ORDER — MEXILETINE HCL 150 MG PO CAPS
300.0000 mg | ORAL_CAPSULE | Freq: Two times a day (BID) | ORAL | Status: DC
  Administered 2021-07-24 – 2021-07-28 (×9): 300 mg via ORAL
  Filled 2021-07-24 (×10): qty 2

## 2021-07-24 MED ORDER — SODIUM CHLORIDE 0.9 % IV SOLN: INTRAVENOUS | Status: DC

## 2021-07-24 NOTE — Code Documentation (Signed)
Responded to Code Stroke called at 0012 on pt already in the ED. Pt came to ED at 2315 with facial numbness after taking losartan, [id number]. CBG-171, NIH-1 for R arm ataxia, CT head-hyperdensity in the basilar artery, concerning for thrombus, no hemorrhage, CTA-basilar artery thrombus. TNK given at 0045. Given mild symptoms plan consists of close monitoring(Post-TNK VS/mNIHSS) and IR intervention if symptoms worsen.  Update: Pt's 0100 NIH-7 and pt c/o 5/10 headache. IR paged out at 0104 and STAT CT head done showing no hemorrhage. Pt to be taken to IR suite by ED RN.

## 2021-07-24 NOTE — Progress Notes (Signed)
Pt developed fist size hematoma while working with therapy. Pt placed in bed and pressure applied at 1517. Per Dr. Corliss Skains, hold pressure for 30 minutes.

## 2021-07-24 NOTE — Progress Notes (Signed)
PHARMACIST CODE STROKE RESPONSE  Notified to mix TNK at 0041 by Dr. Derry Lory Delivered TNK to RN at 0045  TNK dose = 20 mg IV over 5 seconds  Issues/delays encountered (if applicable): None  Thomas Wood 07/24/21 12:47 AM

## 2021-07-24 NOTE — Evaluation (Signed)
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   New onset of congestive heart failure (HCC) 07/11/2021   Shortness of breath 07/11/2021   Low HDL (under 40) 09/18/2017   Chalazion of right eye 09/18/2017   Excessive sweating, local 09/18/2017   Environmental allergies 08/11/2016   History of kidney stones 08/11/2016   Family history of premature coronary heart disease 08/11/2016   Past Medical History:  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   Chronic kidney disease    stones   GERD (gastroesophageal reflux disease)    New onset of congestive heart failure (HCC) 07/11/2021   Past Surgical History:  Past Surgical History:  Procedure Laterality Date   ARTERIAL LINE INSERTION N/A 07/12/2021   Procedure: ARTERIAL LINE INSERTION;  Surgeon: Dolores Patty, MD;  Location: [facility];  Service: Cardiovascular;  Laterality: N/A;   CENTRAL LINE INSERTION  07/12/2021   Procedure: CENTRAL LINE INSERTION;  Surgeon: Dolores Patty, MD;  Location: [facility];  Service: Cardiovascular;;   RIGHT/LEFT HEART CATH AND CORONARY ANGIOGRAPHY N/A 07/12/2021   Procedure: RIGHT/LEFT HEART CATH AND CORONARY ANGIOGRAPHY;  Surgeon: Dolores Patty, MD;  Location: [facility];  Service: Cardiovascular;  Laterality: N/A;   HPI:  Pt is a 25 y.o. male who presents with left facial numbness + dysarthric speech + dysconjugate gaze. Code stroke activated. TNK given.  CTH w/o contrast with no acute abnormality but concerning for a hyperdense  basilar artery. CT Angio with thrombus in basilar artery. Intubated for Cerebral angiogram (11/23). Pt also with recent dx of new onset biventricular heart failure. PMH: asthma, GERD, obesity s/p marked weight loss, new onset acute CHF with EF of 10-15%.   Assessment / Plan / Recommendation Clinical Impression  Pt presents with cognitive communication deficits marked primarily by impaired executive function skills, including sequencing, planning, reasoning, self-montioring and self-correcting. [facility] Mental Status Examination (SLUMS) administered with pt yielding the following score: 24/30, suggestive of mild cognitive deficits. He demonstrates strengths in orientation, immediate/delayed list recall, simple mathematical skills and sustained attention for completion of tasks. Along with higher level executive functions, pt also exhibited some difficulty in paragraph level/discourse recall. SLP f/u warranted to address cognitive functions during acute stay.    SLP Assessment  SLP Recommendation/Assessment: Patient needs continued Speech Lanaguage Pathology Services SLP Visit Diagnosis: Cognitive communication deficit (R41.841)    Recommendations for follow up therapy are one component of a multi-disciplinary discharge planning process, led by the attending physician.  Recommendations may be updated based on patient status, additional functional criteria and insurance authorization.    Follow Up Recommendations  Other (comment) (TBD)    Assistance Recommended at Discharge  Intermittent Supervision/Assistance  Functional Status Assessment Patient has had a recent decline in their functional status and demonstrates the ability to make significant improvements in function in a reasonable and predictable amount of time.  Frequency and Duration min 2x/week  2 weeks      SLP Evaluation Cognition  Overall Cognitive Status: Impaired/Different from baseline Arousal/Alertness:  Awake/alert Orientation Level: Oriented X4 Attention: Sustained  Sustained Attention: Appears intact Memory: Impaired Memory Impairment: Retrieval deficit;Decreased short term memory Decreased Short Term Memory: Verbal complex Immediate Memory Recall:  (SLUMS- Delayed paragraph recall (6/8)) Awareness: Impaired Awareness Impairment: Intellectual impairment;Emergent impairment Problem Solving: Impaired Problem Solving Impairment: Functional complex Executive Function: Reasoning;Sequencing;Organizing;Self Monitoring;Self Correcting Reasoning: Impaired Reasoning Impairment: Functional complex Sequencing: Impaired Sequencing Impairment: Functional complex Organizing: Impaired Organizing Impairment: Functional complex Self Monitoring: Impaired Self Monitoring Impairment: Functional complex Self Correcting: Impaired Self Correcting Impairment: Functional complex       Comprehension  Auditory Comprehension Overall Auditory Comprehension: Appears within functional limits for tasks assessed Visual Recognition/Discrimination Discrimination: Not tested Reading Comprehension Reading Status: Not tested    Expression Expression Primary Mode of Expression: Verbal Verbal Expression Overall Verbal Expression: Appears within functional limits for tasks assessed Written Expression Written Expression: Not tested   Oral / Motor  Oral Motor/Sensory Function Overall Oral Motor/Sensory Function: Within functional limits Motor Speech Overall Motor Speech: Appears within functional limits for tasks assessed   [place], [place], [place] Office Number: [phone]  Acie Fredrickson 07/24/2021, 9:12 AM

## 2021-07-24 NOTE — Progress Notes (Signed)
ANTICOAGULATION CONSULT NOTE - Initial Consult  Pharmacy Consult for apixaban Indication:  CVA  No Known Allergies  Patient Measurements: Height: 5\' 11"  (180.3 cm) Weight: 80.3 kg (177 lb) IBW/kg (Calculated) : 75.3  Vital Signs: Temp: 98.2 F (36.8 C) (11/23 0800) Temp Source: Oral (11/23 0800) BP: 112/69 (11/23 0900) Pulse Rate: 77 (11/23 0900)  Labs: Recent Labs    07/24/21 0010 07/24/21 0021 07/24/21 0621  HGB 15.3 16.7 14.3  HCT 44.9 49.0 42.1  PLT 289  --  282  APTT 26  --   --   LABPROT 14.4  --   --   INR 1.1  --   --   CREATININE 1.37* 1.30* 1.09    Estimated Creatinine Clearance: 111.3 mL/min (by C-G formula based on SCr of 1.09 mg/dL).   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   Past Month   albuterol (VENTOLIN HFA) 108 (90 Base) MCG/ACT inhaler Inhale 2 puffs into the lungs every 6 (six) hours as needed for wheezing.   Past Month   digoxin (LANOXIN) 0.125 MG tablet Take 1 tablet (0.125 mg total) by mouth daily. 30 tablet 6 07/23/2021   empagliflozin (JARDIANCE) 10 MG TABS tablet Take 1 tablet (10 mg total) by mouth daily. 30 tablet 6 07/23/2021   losartan (COZAAR) 25 MG tablet Take 0.5 tablets (12.5 mg total) by mouth at bedtime. 45 tablet 1 07/23/2021   mexiletine (MEXITIL) 150 MG capsule Take 2 capsules (300 mg total) by mouth 2 (two) times daily. 120 capsule 6 07/23/2021   spironolactone (ALDACTONE) 25 MG tablet Take 1 tablet (25 mg total) by mouth daily. 30 tablet 6 07/23/2021    Assessment: 25 YOM who  presents with acute basilar artery thrombus in the setting of severe heart failure and received thrombolysis. Medical team suspects this is cardioembolic in nature. Pharmacy consulted to start apixaban on 11/24 for secondary stroke prophylaxis.   H/H and Plt wnl. SCr wnl   Goal of Therapy:  Secondary stroke prophylaxis  Monitor platelets by anticoagulation protocol: Yes   Plan:  -Start apixaban 5 mg twice daily at 10 AM on 11/24 -Monitor CBC, renal fx and for s/s of bleeding -Will educate prior to discharge   12/24, PharmD., BCPS, BCCCP Clinical Pharmacist Please refer to [facility] for unit-specific pharmacist

## 2021-07-24 NOTE — Progress Notes (Signed)
PT Cancellation Note  Patient Details Name: CRIST KRUSZKA MRN: [id number] DOB: 1996/06/17   Cancelled Treatment:    Reason Eval/Treat Not Completed: Active bedrest order Will re-attempt once activity orders are updated.   Sathvik Tiedt A. Dan Humphreys PT, DPT Acute Rehabilitation Services Pager [phone] Office [phone]    Viviann Spare 07/24/2021, 8:00 AM

## 2021-07-24 NOTE — ED Notes (Signed)
Pt arrived to IR suite at 0125

## 2021-07-24 NOTE — Anesthesia Preprocedure Evaluation (Signed)
Anesthesia Evaluation  Patient identified by MRN, date of birth, ID band Patient awake    Reviewed: Allergy & Precautions, NPO status , Patient's Chart, lab work & pertinent test resultsPreop documentation limited or incomplete due to emergent nature of procedure.  Airway Mallampati: III       Dental   Pulmonary shortness of breath, asthma ,    breath sounds clear to auscultation       Cardiovascular +CHF   Rhythm:Regular Rate:Normal     Neuro/Psych History noted Dr. Chilton Si    GI/Hepatic Neg liver ROS, GERD  ,  Endo/Other    Renal/GU Renal disease     Musculoskeletal   Abdominal   Peds  Hematology   Anesthesia Other Findings   Reproductive/Obstetrics                             Anesthesia Physical Anesthesia Plan  ASA: 3  Anesthesia Plan: General   Post-op Pain Management:    Induction: Intravenous  PONV Risk Score and Plan: 2 and Treatment may vary due to age or medical condition, Ondansetron and Dexamethasone  Airway Management Planned:   Additional Equipment: Arterial line  Intra-op Plan:   Post-operative Plan: Possible Post-op intubation/ventilation  Informed Consent: I have reviewed the patients History and Physical, chart, labs and discussed the procedure including the risks, benefits and alternatives for the proposed anesthesia with the patient or authorized representative who has indicated his/her understanding and acceptance.     Dental advisory given  Plan Discussed with: CRNA, Anesthesiologist and Surgeon  Anesthesia Plan Comments:         Anesthesia Quick Evaluation

## 2021-07-24 NOTE — Progress Notes (Signed)
Disability forms completed, signed by Dr Gala Romney, and faxed into [place] at [phone]

## 2021-07-24 NOTE — Progress Notes (Signed)
Pt was identified by name and d.o.b. Manual pressure was held on left groin site for 15 min starting at @1530  hrs. Hemostasis was achieved @ 1545 hrs. Distal pulses/hemostasis/groin site were all verified by myself, Dr. , and [facility] RN. Quikclot dressing will need to be removed after 24 hrs.

## 2021-07-24 NOTE — Transfer of Care (Signed)
Immediate Anesthesia Transfer of Care Note  Patient: Thomas Wood  Procedure(s) Performed: RADIOLOGY WITH ANESTHESIA  Patient Location: 4N- ICU  Anesthesia Type:General  Level of Consciousness: awake  Airway & Oxygen Therapy: Patient Spontanous Breathing and Patient connected to nasal cannula oxygen  Post-op Assessment: Report given to RN, Post -op Vital signs reviewed and stable, Patient moving all extremities and Patient able to stick tongue midline  Post vital signs: Reviewed and stable  Last Vitals:  Vitals Value Taken Time  BP 111/91 07/24/21 0251  Temp 36.6 C 07/24/21 0246  Pulse 108 07/24/21 0258  Resp 19 07/24/21 0258  SpO2 97 % 07/24/21 0258  Vitals shown include unvalidated device data.  Last Pain:  Vitals:   07/24/21 0246  TempSrc: Oral  PainSc:          Complications: No notable events documented.

## 2021-07-24 NOTE — Progress Notes (Signed)
Orthopedic Tech Progress Note Patient Details:  Thomas Wood 09/15/1995 [id number]  Ortho Devices Type of Ortho Device: Knee Immobilizer Ortho Device/Splint Location: LLE Ortho Device/Splint Interventions: Ordered, Application, Adjustment   Post Interventions Patient Tolerated: Well Instructions Provided: Care of device  [name] 07/24/2021, 3:55 PM

## 2021-07-24 NOTE — H&P (Addendum)
NEUROLOGY H and P NOTE   Date of service: July 24, 2021 Patient Name: Thomas Wood MRN:  [id number] DOB:  [birth year] _ _ _   _ __   _ __ _ _  __ __   _ __   __ _  History of Present Illness  Thomas Wood is a 25 y.o. male with PMH significant for asthma, GERD, obesity s/p marked weight loss, new onset acute CHF with EF of 10-15% who presents with left facial numbness + dysarthric speech + dysconjugate gaze.  He reports that his symptoms started around 2200 on 07/23/21. He called EMS but then decided to come to the hospital himself after speaking with his family. A code stroke was activated on arrival to the hospital.  [facility] w/o contrast with no acute abnormality but concerning for a hyperdense basilar artery. 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  mRS: 0 tNKase: offered. Discussed risks and benefits of tNKase. Thrombectomy: We initially discussed the risks and benefits of potential thrombectomy with the patient and with his permission with his girlfriend of 8 years who was present at the bedside.  His symptoms were very mild with a NIH stroke scale of just 3.  Initially decided to hold off on thrombectomy and closely monitor patient in the neuro ICU and take him for emergent intervention if his symptoms were to worsen. However prior to transfer to the ICU, patient was noted to have worsening of his symptoms with now not blinking to threat BL, left gaze preference, difficulty tracking face or making eye contact and repeating "728" multiple times and unable to answer questions appropriately. At this point, we activated code IR based on earlier discussion with patient. He did improve shortly afterwards with NIHSS of 3 again but given the concern for fluctuating symptoms, we felt it was better to take him to IR with hope to minimize ischemic any ischemic infarct. Patient agreed with this assessment and signed  the consent form. We also obtained a STAT CTH prior to taking him to IR to ensure no ICH given that this happened after he was given tNKAse. CTH was negative for ICH. Important to note that given his significant history of acute onset heart failure, permissive hypertension would have been difficult to achieve. NIHSS components Score: Comment  1a Level of Conscious 0[x]  1[]  2[]  3[]      1b LOC Questions 0[x]  1[]  2[]       1c LOC Commands 0[x]  1[]  2[]       2 Best Gaze 0[x]  1[]  2[]       3 Visual 0[x]  1[]  2[]  3[]      4 Facial Palsy 0[x]  1[]  2[]  3[]      5a Motor Arm - left 0[x]  1[]  2[]  3[]  4[]  UN[]    5b Motor Arm - Right 0[x]  1[]  2[]  3[]  4[]  UN[]    6a Motor Leg - Left 0[x]  1[]  2[]  3[]  4[]  UN[]    6b Motor Leg - Right 0[x]  1[]  2[]  3[]  4[]  UN[]    7 Limb Ataxia 0[]  1[x]  2[]  3[]  UN[]     8 Sensory 0[]  1[x]  2[]  UN[]      9 Best Language 0[x]  1[]  2[]  3[]      10 Dysarthria 0[]  1[x]  2[]  UN[]      11 Extinct. and Inattention 0[x]  1[]  2[]       TOTAL: 3     ROS   Constitutional Denies weight loss, fever and chills.   HEENT endorses  changes in vision but not in hearing.   Respiratory Denies SOB and cough.   CV Denies palpitations and CP   GI Denies abdominal pain, nausea, vomiting and diarrhea.   GU Denies dysuria and urinary frequency.   MSK Denies myalgia and joint pain.   Skin Denies rash and pruritus.   Neurological Denies headache and syncope.   Psychiatric Denies recent changes in mood. Denies anxiety and depression.    Past History   Past Medical History:  Diagnosis Date  . Asthma   . Chronic kidney disease    stones  . GERD (gastroesophageal reflux disease)   . New onset of congestive heart failure ([place]) 07/11/2021   Past Surgical History:  Procedure Laterality Date  . ARTERIAL LINE INSERTION N/A 07/12/2021   Procedure: ARTERIAL LINE INSERTION;  Surgeon: Jolaine Artist, MD;  Location: [facility];  Service: Cardiovascular;  Laterality: N/A;  . CENTRAL LINE INSERTION   07/12/2021   Procedure: CENTRAL LINE INSERTION;  Surgeon: Jolaine Artist, MD;  Location: [facility];  Service: Cardiovascular;;  . RIGHT/LEFT HEART CATH AND CORONARY ANGIOGRAPHY N/A 07/12/2021   Procedure: RIGHT/LEFT HEART CATH AND CORONARY ANGIOGRAPHY;  Surgeon: Jolaine Artist, MD;  Location: [facility];  Service: Cardiovascular;  Laterality: N/A;   Family History  Problem Relation Age of Onset  . Asthma Mother   . Cancer Mother        breast  . Miscarriages / Stillbirths Mother   . Kidney disease Mother        stones  . Diabetes Father   . Heart disease Father 71        MI  . Heart failure Father        LVAD>>Heart Transplant (66s)  . Asthma Brother    Social History   Socioeconomic History  . Marital status: Single    Spouse name: Not on file  . Number of children: Not on file  . Years of education: Not on file  . Highest education level: Not on file  Occupational History  . Not on file  Tobacco Use  . Smoking status: Never  . Smokeless tobacco: Never  Substance and Sexual Activity  . Alcohol use: No  . Drug use: No  . Sexual activity: Yes    Birth control/protection: Pill  Other Topics Concern  . Not on file  Social History Narrative  . Not on file   Social Determinants of Health   Financial Resource Strain: Not on file  Food Insecurity: Not on file  Transportation Needs: Not on file  Physical Activity: Not on file  Stress: Not on file  Social Connections: Not on file   No Known Allergies  Medications  (Not in a hospital admission)    Vitals   Vitals:   07/23/21 2320 07/23/21 2325 07/24/21 0002 07/24/21 0038  BP:   104/71 121/74  Pulse:   (!) 102 (!) 125  Resp:   (!) 22 16  Temp: 99 F (37.2 C)     TempSrc: Oral     SpO2:   99% 100%  Weight:  80.3 kg    Height:  5\' 11"  (1.803 m)       Body mass index is 24.69 kg/m.  Physical Exam   General: Laying comfortably in bed; in no acute distress.  HENT: Normal  oropharynx and mucosa. Normal external appearance of ears and nose.  Neck: Supple, no pain or tenderness  CV: No JVD. No peripheral  edema.  Pulmonary: Symmetric Chest rise. Normal respiratory effort.  Abdomen: Soft to touch, non-tender.  Ext: No cyanosis, edema, or deformity  Skin: No rash. Normal palpation of skin.   Musculoskeletal: Normal digits and nails by inspection. No clubbing.   Neurologic Examination  Mental status/Cognition: Alert, oriented to self, place, month and year, good attention.  Speech/language: mildly dysarthric speech, fluent, comprehension intact, object naming intact, repetition intact.  Cranial nerves:   CN II Pupils equal and reactive to light, no VF deficits    CN III,IV,VI EOM intact, no gaze preference or deviation, no nystagmus. Dysconjugate gaze.   CN V normal sensation in V1, V2, and V3 segments bilaterally    CN VII no asymmetry, no nasolabial fold flattening    CN VIII normal hearing to speech    CN IX & X normal palatal elevation, no uvular deviation    CN XI 5/5 head turn and 5/5 shoulder shrug bilaterally   CN XII midline tongue protrusion   Motor:  Muscle bulk: normal, tone normal, pronator drift none tremor none Mvmt Root Nerve  Muscle Right Left Comments  SA C5/6 Ax Deltoid 5 5   EF C5/6 Mc Biceps 5 5   EE C6/7/8 Rad Triceps 5 5   WF C6/7 Med FCR     WE C7/8 PIN ECU     F Ab C8/T1 U ADM/FDI 5 5   HF L1/2/3 Fem Illopsoas 5 5   KE L2/3/4 Fem Quad 5 5   DF L4/5 D Peron Tib Ant 5 5   PF S1/2 Tibial Grc/Sol 5 5    Reflexes:  Right Left Comments  Pectoralis      Biceps (C5/6) 2 2   Brachioradialis (C5/6) 2 2    Triceps (C6/7) 2 2    Patellar (L3/4) 2 2    Achilles (S1)      Hoffman      Plantar     Jaw jerk    Sensation:  Light touch Intact throughout   Pin prick    Temperature    Vibration   Proprioception    Coordination/Complex Motor:  - Finger to Nose with ataxia BL - Heel to shin without ataxia. - Rapid alternating  movement are midly slowed in LUE - Gait: stumbling when walking, Stride length short. Arm swing poor. Base width wide  Labs   CBC:  Recent Labs  Lab 07/24/21 0010 07/24/21 0021  WBC 13.4*  --   NEUTROABS 12.0*  --   HGB 15.3 16.7  HCT 44.9 49.0  MCV 88.4  --   PLT 289  --     Basic Metabolic Panel:  Lab Results  Component Value Date   NA 137 07/24/2021   K 3.9 07/24/2021   CO2 19 (L) 07/24/2021   GLUCOSE 171 (H) 07/24/2021   BUN 26 (H) 07/24/2021   CREATININE 1.30 (H) 07/24/2021   CALCIUM 9.8 07/24/2021   GFRNONAA >60 07/24/2021   Lipid Panel:  Lab Results  Component Value Date   LDLCALC 118 (H) 07/12/2021   HgbA1c:  Lab Results  Component Value Date   HGBA1C 5.3 07/14/2021   Urine Drug Screen:     Component Value Date/Time   LABOPIA NONE DETECTED 07/12/2021 0900   COCAINSCRNUR NONE DETECTED 07/12/2021 0900   LABBENZ NONE DETECTED 07/12/2021 0900   AMPHETMU NONE DETECTED 07/12/2021 0900   THCU POSITIVE (A) 07/12/2021 0900   LABBARB NONE DETECTED 07/12/2021 0900    Alcohol Level  Component Value Date/Time   ETH <10 07/24/2021 0010    CT Head without contrast(Personally reviewed): CTH was negative for a large hypodensity concerning for a large territory infarct or hyperdensity concerning for an ICH. Has hyperdense basilar artery.   CT angio Head and Neck with contrast(Personally reviewed): Thrombus at the tip of the basilar artery likely occluding the origin of BL SCAs. Flow noted in BL PCAs. He does not have Pcomm or fetal PCAs.  MRI Brain(Personally reviewed): pending Impression   Thomas Wood is a 25 y.o. male with PMH significant for asthma, GERD, obesity s/p marked weight loss, new onset acute CHF with EF of 10-15% who presents with left facial numbness + dysarthric speech + dysconjugate gaze. Found to have a thrombus at the tip of the basilar artery likely occluding BL SCAs. He was offered and agreed to [facility]. Initial plan per discussion  with patient was to hold off on thrombectomy and closely monitor but noted to have an episode of vision loss and not blinking to threat + left gaze preference + worsening dysconjugate gaze and repeating same phrase in response to questions. He did improve but given fluctuating symptoms, IR was activated and he was taken for thrombectomy.  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   Secondary Diagnosis: Acute systolic (congestive) heart failure  Recommendations  Plan: - Frequent NeuroChecks for post tNK care per stroke unit protocol: - Initial CTH demonstrated no acute hemorrhage or mass - MRI Brain - pending. - CTA - basilar thrombus - TTE - pending. - Lipid Panel: LDL - pending.  - Statin: if LDL > 70. - HbA1c: pending. - Antithrombotic: Start ASA 81 mg daily if 24 h CTH does not show acute hemorrhage - DVT prophylaxis: SCDs. Pharmacologic prophylaxis if 24 h CTH does not demonstrate acute hemorrhage - Systolic Blood Pressure goal: <per Neuro IR for the first 24 hours after thrombectomy. - Telemetry monitoring for arrhythmia: 72 hours - Swallow screen - ordered - PT/OT/SLP consults.   Acute systolic heart failure: - Continue home digoxin and Jiardiance - Hold off on Spironolactone and losartan given acute stroke - Will reach out to cardiology team regarding additional recs. - Repeat TTE, specifically to evaluate for cardiac thrombus given low EF. - discussed with cards overnight, will need to formally consult them in AM.  Asthma: - continue home albuterol inhaler.   _____________________________________________________________________  This patient is critically ill and at significant risk of neurological worsening, death and care requires constant monitoring of vital signs, hemodynamics,respiratory and cardiac monitoring, neurological assessment, discussion with family, other specialists and medical decision making of high complexity. I spent  90 minutes of neurocritical care time  in the care of  this patient. This was time spent independent of any time provided by nurse practitioner or PA.  Donnetta Simpers [facility] Pager Number [phone] 07/24/2021  2:42 AM    Thank you for the opportunity to take part in the care of this patient. If you have any further questions, please contact the neurology consultation attending.  Signed,  [place] Pager Number [phone] _ _ _   _ __   _ __ _ _  __ __   _ __   __ _

## 2021-07-24 NOTE — ED Notes (Signed)
Pt off unit

## 2021-07-24 NOTE — ED Notes (Signed)
Belongings given to girlfriend with pt consent.

## 2021-07-24 NOTE — Evaluation (Addendum)
Occupational Therapy Evaluation Patient Details Name: Thomas Wood MRN: [id number] DOB: May 28, 1996 Today's Date: 07/24/2021   History of Present Illness 25 y.o. male admitted with left facial numbness, dysarthric speech & dysconjugate gaze. 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 +tNKase. Symptoms worsened and pt underwent thrombectomy. PMH significant for asthma, GERD, obesity s/p marked weight loss, new onset acute CHF with EF of 10-15%   Clinical Impression   Per nsg, bedrest orders lifted. PTA pt lives with his girlfriend and a roommate in an apt. 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 Pt discharged home @ 1 week ago from [facility] wearing lifevest. Pt appears close to baseline with mobility and vision as noted below however evaluation limited by medical complication.  L groin checked prior to mobility and noted minimal swelling. Pt began complaining of L groin pain once EOB and became diaphoretic and complained of not feeling well therefore returned to supine; BP 101/72 (prior to mobility BP100/69). Noted increased swelling L groin - nsg alerted. Nsg holding pressure on groin; BP 84/55 and per nsg request asked to have MD paged.  Will follow pt acutely to maixmize functional level of independence to DC home. Do not anticipate OT needs after DC.     Recommendations for follow up therapy are one component of a multi-disciplinary discharge planning process, led by the attending physician.  Recommendations may be updated based on patient status, additional functional criteria and insurance authorization.   Follow Up Recommendations  No OT follow up    Assistance Recommended at Discharge Set up Supervision/Assistance  Functional Status Assessment  Patient has had a recent decline in their functional status and demonstrates the ability to make significant improvements in function in a reasonable and predictable amount of time.  Equipment Recommendations  BSC/3in1 (to  use as shower seat)    Recommendations for Other Services       Precautions / Restrictions Precautions Precaution Comments: L groin hematoma      Mobility Bed Mobility Overal bed mobility: Modified Independent                  Transfers Overall transfer level: Needs assistance   Transfers: Sit to/from Stand Sit to Stand: Min guard                  Balance Overall balance assessment: No apparent balance deficits (not formally assessed)                                         ADL either performed or assessed with clinical judgement   ADL Overall ADL's : Needs assistance/impaired                                     Functional mobility during ADLs: Min guard General ADL Comments: Pt overall set up for ADL at this time; easlily fatigues; will benfit fomr energy conservation adn showerseat; will further assess     Vision Baseline Vision/History: 0 No visual deficits Vision Assessment?: Yes Additional Comments: Pt with double vision initiallyhowever pt reports vision has returned to basleine     Perception Perception Comments: [facility]   Praxis      Pertinent Vitals/Pain Pain Assessment: 0-10 Pain Score: 5  Pain Location: L groin Pain Descriptors / Indicators: Discomfort Pain Intervention(s): Limited  activity within patient's tolerance;Other (comment) (nsg made aware)     Hand Dominance Right   Extremity/Trunk Assessment Upper Extremity Assessment Upper Extremity Assessment: Overall WFL for tasks assessed   Lower Extremity Assessment Lower Extremity Assessment: Defer to PT evaluation (L groin swelling)   Cervical / Trunk Assessment Cervical / Trunk Assessment: Normal   Communication Communication Communication: No difficulties   Cognition Arousal/Alertness: Awake/alert Behavior During Therapy: WFL for tasks assessed/performed Overall Cognitive Status: Impaired/Different from baseline Area of Impairment:  (will  further assess)                               General Comments: girlfriend feels he is close to baseline     General Comments       Exercises     Shoulder Instructions      Home Living Family/patient expects to be discharged to:: Private residence Living Arrangements: Spouse/significant other Available Help at Discharge: Friend(s) Type of Home: Apartment Home Access: Stairs to enter [employer] of Steps: flight Entrance Stairs-Rails: Left Home Layout: One level     Bathroom Shower/Tub: Teacher, early years/pre: Standard Bathroom Accessibility: Yes How Accessible: Accessible via walker Home Equipment: None          Prior Functioning/Environment Prior Level of Function : Independent/Modified Independent;Working/employed (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)                        OT Problem List: Decreased activity tolerance;Decreased knowledge of use of DME or AE;Cardiopulmonary status limiting activity;Pain      OT Treatment/Interventions: Self-care/ADL training;Energy conservation;DME and/or AE instruction;Therapeutic activities;Patient/family education    OT Goals(Current goals can be found in the care plan section) Acute Rehab OT Goals Patient Stated Goal: to get better OT Goal Formulation: With patient Time For Goal Achievement: 08/07/21 Potential to Achieve Goals: Good  OT Frequency: Min 2X/week   Barriers to D/C:            Co-evaluation              AM-PAC OT "6 Clicks" Daily Activity     Outcome Measure Help from another person eating meals?: None Help from another person taking care of personal grooming?: A Little Help from another person toileting, which includes using toliet, bedpan, or urinal?: A Little Help from another person bathing (including washing, rinsing, drying)?: A Little Help from another person to put on and taking off regular upper body  clothing?: A Little Help from another person to put on and taking off regular lower body clothing?: A Little 6 Click Score: 19   End of Session Nurse Communication: Other (comment) (need to assess L groin; pt status)  Activity Tolerance: Other (comment) (limited by pt not feeling well; diaphoretic) Patient left: in bed;with call bell/phone within reach;with family/visitor present  OT Visit Diagnosis: Unsteadiness on feet (R26.81);Pain Pain - Right/Left: Left Pain - part of body:  (groin)                Time: 1445-1510 OT Time Calculation (min): 25 min Charges:  OT General Charges $OT Visit: 1 Visit OT Evaluation $OT Eval Moderate Complexity: 1 Mod OT Treatments $Self Care/Home Management : 8-22 mins  Maurie Boettcher, OT/L   Acute OT Clinical Specialist [place] Pager [phone] Office [phone]   [facility] 07/24/2021, 3:56 PM

## 2021-07-24 NOTE — Progress Notes (Signed)
PT Cancellation Note  Patient Details Name: Thomas Wood MRN: [id number] DOB: May 05, 1996   Cancelled Treatment:    Reason Eval/Treat Not Completed: Medical issues which prohibited therapy Per OT, patient with new hematoma at femoral site and enlarging. PT will re-attempt at later date.   Thomas Wood A. Dan Humphreys PT, DPT Acute Rehabilitation Services Pager [phone] Office [phone]    Thomas Wood 07/24/2021, 3:23 PM

## 2021-07-24 NOTE — Progress Notes (Addendum)
OT Cancellation Note  Patient Details Name: Thomas Wood MRN: [id number] DOB: 1996/06/16   Cancelled Treatment:    Reason Eval/Treat Not Completed: Active bedrest order (will assess when activity orders updated. Thanks)  Burnett Corrente Nobuo Nunziata, OT 07/24/2021  07/24/2021, 8:19 AM

## 2021-07-24 NOTE — ED Provider Notes (Signed)
[facility] EMERGENCY DEPARTMENT Provider Note   CSN: [id number] Arrival date & time: 07/23/21  2315  An emergency department physician performed an initial assessment on this suspected stroke patient at 0012.  History Chief Complaint  Patient presents with   Numbness    Thomas Wood is a 25 y.o. male.  The history is provided by the patient and medical records.  Thomas Wood is a 25 y.o. male who presents to the Emergency Department complaining of numbness.  He presents to the ED for evaluation of left facial numbness and difficulty speaking that started at 10pm, just after taking his evening medications.  He started feeling dizzy around 7pm with diaphoresis in the near syncopal sensation.Marland Kitchen  He was recently diagnosed with advanced heart failure and discharged from the hospital on 07/16/21.  He has been compliant with medications since ED discharge.  No tobacco or alcohol use.  No current marijuana use.  Has a family hx/o CHF.  He states that his vision feels off/blurry.    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   New onset of congestive heart failure (HCC) 07/11/2021   Shortness of breath 07/11/2021   Low HDL (under 40) 09/18/2017   Chalazion of right eye 09/18/2017   Excessive sweating, local 09/18/2017   Environmental allergies 08/11/2016   History of kidney stones 08/11/2016   Family history of premature coronary heart disease 08/11/2016    Past Surgical History:  Procedure Laterality Date   ARTERIAL LINE INSERTION N/A 07/12/2021    Procedure: ARTERIAL LINE INSERTION;  Surgeon: Dolores Patty, MD;  Location: [facility] INVASIVE CV [facility];  Service: Cardiovascular;  Laterality: N/A;   CENTRAL LINE INSERTION  07/12/2021   Procedure: CENTRAL LINE INSERTION;  Surgeon: Dolores Patty, MD;  Location: [facility] INVAS[facility] CV [facility];  Service: Cardiovascular;;   RIGHT/LEFT HEART CATH AND CORONARY ANGIOGRAPHY N/A 07/12/2021   Procedure: RIGHT/LEFT HEART CATH AND CORONARY ANGIOGRAPHY;  Surgeon: Dolores Patty, MD;  Location: [facility] INVASIVE CV LAB;  Service: Cardiovascular;  Laterality: N/A;       Family History  Problem Relation Age of Onset   Asthma Mother    Cancer Mother        breast   Miscarriages / Stillbirths Mother    Kidney disease Mother        stones   Diabetes Father    Heart disease Father 70        MI   Heart failure Father        LVAD>>Heart Transplant (8s)   Asthma Brother     Social History   Tobacco Use   Smoking status: Never   Smokeless tobacco: Never  Substance Use Topics   Alcohol use: No   Drug use: No    Home Medications Prior to Admission medications   Medication Sig Start Date End Date Taking? Authorizing Provider  acetaminophen (TYLENOL) 500 MG tablet Take 1,000 mg by mouth every 6 (six) hours as needed for moderate pain.    [provider]  albuterol (VENTOLIN HFA) 108 (90 Base)  MCG/ACT inhaler Inhale 2 puffs into the lungs every 6 (six) hours as needed for wheezing. 07/11/21   [provider]  cetirizine (ZYRTEC) 10 MG tablet Take 10 mg by mouth daily as needed.    [provider]  digoxin (LANOXIN) 0.125 MG tablet Take 1 tablet (0.125 mg total) by mouth daily. 07/17/21   Clegg, Amy D, NP  empagliflozin (JARDIANCE) 10 MG TABS tablet Take 1 tablet (10 mg total) by mouth daily. 07/17/21   Clegg, Amy D, NP  losartan (COZAAR) 25 MG tablet Take 0.5 tablets (12.5 mg total) by mouth at bedtime. 07/23/21   Bensimhon, Bevelyn Buckles, MD  mexiletine (MEXITIL) 150 MG capsule Take 2  capsules (300 mg total) by mouth 2 (two) times daily. 07/16/21   Clegg, Amy D, NP  spironolactone (ALDACTONE) 25 MG tablet Take 1 tablet (25 mg total) by mouth daily. 07/17/21   Sherald Hess, NP    Allergies    Patient has no known allergies.  Review of Systems   Review of Systems  All other systems reviewed and are negative.  Physical Exam Updated Vital Signs BP 97/62   Pulse 83   Temp 97.9 F (36.6 C) (Oral)   Resp 11   Ht 5\' 11"  (1.803 m)   Wt 80.3 kg   SpO2 96%   BMI 24.69 kg/m   Physical Exam Vitals and nursing note reviewed.  Constitutional:      Appearance: He is well-developed.  HENT:     Head: Normocephalic and atraumatic.  Cardiovascular:     Rate and Rhythm: Regular rhythm. Tachycardia present.     Heart sounds: No murmur heard. Pulmonary:     Effort: Pulmonary effort is normal. No respiratory distress.     Breath sounds: Normal breath sounds.  Abdominal:     Palpations: Abdomen is soft.     Tenderness: There is no abdominal tenderness. There is no guarding or rebound.  Musculoskeletal:        General: No tenderness.  Skin:    General: Skin is warm and dry.  Neurological:     Mental Status: He is alert and oriented to person, place, and time.     Comments: Altered sensation to light touch in left face.  No asymmetry of facial movements.  5/5 strength in all four extremities.  Mild dysarthria.  Fluent speech. Visual fields grossly intact  Psychiatric:        Behavior: Behavior normal.    ED Results / Procedures / Treatments   Labs (all labs ordered are listed, but only abnormal results are displayed) Labs Reviewed  CBC - Abnormal; Notable for the following components:      Result Value   WBC 13.4 (*)    All other components within normal limits  DIFFERENTIAL - Abnormal; Notable for the following components:   Neutro Abs 12.0 (*)    All other components within normal limits  COMPREHENSIVE METABOLIC PANEL - Abnormal; Notable for the following  components:   Sodium 134 (*)    CO2 19 (*)    Glucose, Bld 176 (*)    BUN 25 (*)    Creatinine, Ser 1.37 (*)    Total Bilirubin 1.3 (*)    All other components within normal limits  RAPID URINE DRUG SCREEN, HOSP PERFORMED - Abnormal; Notable for the following components:   Tetrahydrocannabinol POSITIVE (*)    All other components within normal limits  URINALYSIS, ROUTINE W REFLEX MICROSCOPIC - Abnormal; Notable for the following components:  Specific Gravity, Urine >1.046 (*)    Glucose, UA >=500 (*)    Hgb urine dipstick MODERATE (*)    Ketones, ur 5 (*)    All other components within normal limits  RESP PANEL BY RT-PCR (FLU A&B, COVID) ARPGX2  MRSA NEXT GEN BY PCR, NASAL  ETHANOL  PROTIME-INR  APTT  HEMOGLOBIN A1C  CBC WITH DIFFERENTIAL/PLATELET  BASIC METABOLIC PANEL  LIPID PANEL  I-STAT CHEM 8, ED    EKG EKG Interpretation  Date/Time:  Wednesday July 24 2021 00:04:17 EST Ventricular Rate:  102 PR Interval:  183 QRS Duration: 105 QT Interval:  372 QTC Calculation: 485 R Axis:   136 Text Interpretation: Sinus tachycardia Paired ventricular premature complexes Left atrial enlargement Right axis deviation Borderline ST elevation, anterior leads Borderline prolonged QT interval When compared with ECG of 07/23/2021, Premature ventricular complexes are now present QT has lengthened Confirmed by Dione Booze ([id number]) on 07/24/2021 2:39:15 AM  Radiology CT HEAD WO CONTRAST ( )  Result Date: 07/24/2021 CLINICAL DATA:  Cerebral hemorrhage suspected EXAM: CT HEAD WITHOUT CONTRAST TECHNIQUE: Contiguous axial images were obtained from the base of the skull through the vertex without intravenous contrast. COMPARISON:  CT head 07/24/2021. FINDINGS: Brain: No acute hemorrhage. No definite acute infarct. No mass, mass effect, or midline shift. No hydrocephalus or extra-axial fluid collection. Vascular: Contrast from recent CTA is noted within the vasculature. Previously noted  hyperdense vessel is less apparent on this exam. Skull: Normal. Negative for fracture or focal lesion. Sinuses/Orbits: No acute finding. Other: The mastoid air cells are well aerated. IMPRESSION: No acute hemorrhage. Electronically Signed   By: Wiliam Ke M.D.   On: 07/24/2021 01:23   CT HEAD CODE STROKE WO CONTRAST  Result Date: 07/24/2021 CLINICAL DATA:  Code stroke.  Left facial numbness EXAM: CT HEAD WITHOUT CONTRAST TECHNIQUE: Contiguous axial images were obtained from the base of the skull through the vertex without intravenous contrast. COMPARISON:  None. FINDINGS: Brain: Possible subtle hypodensity high right posterior frontal and anterior parietal lobe, although this may be artifactual. No acute hemorrhage, mass, mass effect, or midline shift. Vascular: Hyperdensity in the basilar artery (series 2, image 10). Skull: Normal. Negative for fracture or focal lesion. Sinuses/Orbits: No acute finding. Other: The mastoids are well aerated. ASPECTS [facility] Stroke Program Early CT Score) - Ganglionic level infarction (caudate, lentiform nuclei, internal capsule, insula, M1-M3 cortex): 7 - Supraganglionic infarction (M4-M6 cortex): 3 Total score (0-10 with 10 being normal): 10 IMPRESSION: 1. Hyperdensity in the basilar artery, concerning for thrombus. 2. Possible subtle hypodensity in the high right posterior frontal and anterior parietal lobe, although this may be artifactual. 3. ASPECTS is 10. Code stroke imaging results were communicated on 07/24/2021 at 12:35 am to provider Dr. Derry Lory via telephone, who verbally acknowledged these results. Electronically Signed   By: Wiliam Ke M.D.   On: 07/24/2021 00:37   CT ANGIO HEAD CODE STROKE  Result Date: 07/24/2021 CLINICAL DATA:  Stroke suspected EXAM: CT ANGIOGRAPHY HEAD AND NECK TECHNIQUE: Multidetector CT imaging of the head and neck was performed using the standard protocol during bolus administration of intravenous contrast. Multiplanar CT  image reconstructions and MIPs were obtained to evaluate the vascular anatomy. Carotid stenosis measurements (when applicable) are obtained utilizing NASCET criteria, using the distal internal carotid diameter as the denominator. CONTRAST:  72mL OMNIPAQUE IOHEXOL 350 MG/ML SOLN COMPARISON:  No prior CTA, correlation is made with same day CT head FINDINGS: CT HEAD FINDINGS For noncontrast findings, please see same  day CT head. CTA NECK FINDINGS Aortic arch: Standard branching. Imaged portion shows no evidence of aneurysm or dissection. No significant stenosis of the major arch vessel origins. Right carotid system: No evidence of dissection, stenosis (50% or greater) or occlusion. Left carotid system: No evidence of dissection, stenosis (50% or greater) or occlusion. Vertebral arteries: Right dominant. No evidence of dissection, stenosis (50% or greater) or occlusion. Skeleton: Negative. Other neck: Negative. Upper chest: 10 mm ground-glass opacity in the anterior right upper lobe (series 6, image 5). No pleural effusion. Review of the MIP images confirms the above findings CTA HEAD FINDINGS Anterior circulation: Both internal carotid arteries are patent to the termini, without stenosis or other abnormality. A1 segments patent. Normal anterior communicating artery. Anterior cerebral arteries are patent to their distal aspects. No M1 stenosis or occlusion. Normal MCA bifurcations. Distal MCA branches perfused and symmetric. 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 Contrast is noted in the bilateral PCAs, which may indicate distal reconstitution or flow from the posterior communicating arteries, which are not definitively visualized. The bilateral vertebral arteries are patent to the vertebrobasilar junction. Bilateral posterior inferior cerebellar arteries are patent. Venous sinuses: As permitted by contrast  timing, patent. Anatomic variants: None significant Review of the MIP images confirms the above findings IMPRESSION: 1. 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 Flow is noted in the bilateral posterior cerebral arteries. 2. No other intracranial large vessel occlusion or significant stenosis. 3. No hemodynamically significant stenosis in the neck. 4. Ground-glass opacity in the partially imaged right upper lobe, which is nonspecific but could be infectious or inflammatory. Code stroke imaging results were communicated on 07/24/2021 at 12:51 am to provider Dr. Derry Lory via telephone, who verbally acknowledged these results. Electronically Signed   By: Wiliam Ke M.D.   On: 07/24/2021 00:53   CT ANGIO NECK CODE STROKE  Result Date: 07/24/2021 CLINICAL DATA:  Stroke suspected EXAM: CT ANGIOGRAPHY HEAD AND NECK TECHNIQUE: Multidetector CT imaging of the head and neck was performed using the standard protocol during bolus administration of intravenous contrast. Multiplanar CT image reconstructions and MIPs were obtained to evaluate the vascular anatomy. Carotid stenosis measurements (when applicable) are obtained utilizing NASCET criteria, using the distal internal carotid diameter as the denominator. CONTRAST:  13mL OMNIPAQUE IOHEXOL 350 MG/ML SOLN COMPARISON:  No prior CTA, correlation is made with same day CT head FINDINGS: CT HEAD FINDINGS For noncontrast findings, please see same day CT head. CTA NECK FINDINGS Aortic arch: Standard branching. Imaged portion shows no evidence of aneurysm or dissection. No significant stenosis of the major arch vessel origins. Right carotid system: No evidence of dissection, stenosis (50% or greater) or occlusion. Left carotid system: No evidence of dissection, stenosis (50% or greater) or occlusion. Vertebral arteries: Right dominant. No evidence of dissection, stenosis (50% or greater) or  occlusion. Skeleton: Negative. Other neck: Negative. Upper chest: 10 mm ground-glass opacity in the anterior right upper lobe (series 6, image 5). No pleural effusion. Review of the MIP images confirms the above findings CTA HEAD FINDINGS Anterior circulation: Both internal carotid arteries are patent to the termini, without stenosis or other abnormality. A1 segments patent. Normal anterior communicating artery. Anterior cerebral arteries are patent to their distal aspects. No M1 stenosis or occlusion. Normal MCA bifurcations. Distal MCA branches perfused and symmetric. Posterior circulation: Focal occlusion of the distal  basilar artery (series 8, image 110), with non opacification of the superior cerebellar arteries. Contrast is noted in the bilateral PCAs, which may indicate distal reconstitution or flow from the posterior communicating arteries, which are not definitively visualized. The bilateral vertebral arteries are patent to the vertebrobasilar junction. Bilateral posterior inferior cerebellar arteries are patent. Venous sinuses: As permitted by contrast timing, patent. Anatomic variants: None significant Review of the MIP images confirms the above findings IMPRESSION: 1. 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 Flow is noted in the bilateral posterior cerebral arteries. 2. No other intracranial large vessel occlusion or significant stenosis. 3. No hemodynamically significant stenosis in the neck. 4. Ground-glass opacity in the partially imaged right upper lobe, which is nonspecific but could be infectious or inflammatory. Code stroke imaging results were communicated on 07/24/2021 at 12:51 am to provider Dr. Derry Lory via telephone, who verbally acknowledged these results. Electronically Signed   By: Wiliam Ke M.D.   On: 07/24/2021 00:53    Procedures Procedures  CRITICAL CARE Performed by: Tilden Fossa   Total critical care time: 40 minutes  Critical care time was exclusive of separately billable procedures and treating other patients.  Critical care was necessary to treat or prevent imminent or life-threatening deterioration.  Critical care was time spent personally by me on the following activities: development of treatment plan with patient and/or surrogate as well as nursing, discussions with consultants, evaluation of patient's response to treatment, examination of patient, obtaining history from patient or surrogate, ordering and performing treatments and interventions, ordering and review of laboratory studies, ordering and review of radiographic studies, pulse oximetry and re-evaluation of patient's condition.  Medications Ordered in ED Medications  ceFAZolin (ANCEF) 2-4 GM/100ML-% IVPB (has no administration in time range)   stroke: mapping our early stages of recovery book (has no administration in time range)  0.9 %  sodium chloride infusion ( Intravenous Duplicate 07/24/21 0245)  senna-docusate (Senokot-S) tablet 1 tablet (has no administration in time range)  pantoprazole (PROTONIX) injection 40 mg (has no administration in time range)  acetaminophen (TYLENOL) tablet 650 mg (650 mg Oral Given 07/24/21 0609)    Or  acetaminophen (TYLENOL) 160 MG/5ML solution 650 mg ( Per Tube See Alternative 07/24/21 0609)    Or  acetaminophen (TYLENOL) suppository 650 mg ( Rectal See Alternative 07/24/21 0609)  0.9 %  sodium chloride infusion ( Intravenous Infusion Verify 07/24/21 0400)  digoxin (LANOXIN) tablet 0.125 mg (has no administration in time range)  empagliflozin (JARDIANCE) tablet 10 mg (has no administration in time range)  mexiletine (MEXITIL) capsule 300 mg (has no administration in time range)  Chlorhexidine Gluconate Cloth 2 % PADS 6 each (has no administration in time range)  iohexol (OMNIPAQUE) 350 MG/ML injection 60 mL (60 mLs Intravenous Contrast Given 07/24/21  0039)  tenecteplase (TNKASE) injection for Stroke 20 mg (20 mg Intravenous Given 07/24/21 0045)  fentaNYL (SUBLIMAZE) 100 MCG/2ML injection (  Override pull for Anesthesia 07/24/21 0211)    ED Course  I have reviewed the triage vital signs and the nursing notes.  Pertinent labs & imaging results that were available during my care of the patient were reviewed by me and considered in my medical decision making (see chart for details).    MDM Rules/Calculators/A&P                          patient here for evaluation  of altered sensation, blurred vision and speech difficulties. He does have some altered sensation as well as altered speech on examination. He was last known well at 10 PM, just prior to taking his evening medication. Code stroke was activated based on his symptoms concerning for acute CVA. He was evaluated by neurologist in the emergency department. Concern for acute basilar artery occlusion and TPA was given. Patient did have persistent symptoms and was transferred to IR for further intervention.  Final Clinical Impression(s) / ED Diagnoses Final diagnoses:  Acute CVA (cerebrovascular accident) [facility])    Rx / DC Orders ED Discharge Orders     None        Tilden Fossa, MD 07/24/21 [phone]

## 2021-07-24 NOTE — Consult Note (Addendum)
Advanced Heart Failure Team Consult Note   Primary Physician: Pcp, No PCP-Cardiologist:  Sanda Klein, MD  Reason for Consultation: Heart Failure   HPI:    Thomas Wood is seen today for evaluation of heart failure at the request of Dr Leonie Man.   Thomas Wood is a 25 year old with history of morbid obesity s/p marked weight loss, HFrEF HF EF 10-15% with LVIDd > 7cm. Father ([name]) with h/o cardiac arrest/HF due to NICM and has undergone VAD placement (here) and OHTx at [facility].   Admitted 07/11/21 with A/C HFrEF . R/LHC showed normal coronary arteries and low output, CI 1.7. Complicated by severe vagal event and near arrest in the cath lab, resuscitated with epinephrine, NE and IVF. Transferred to ICU. Able to wean off NE. GDMT introduced. cMRI showed severe biventricular dysfunction w/ severe LV dilation and globally elevated ECV signal, w/ myocardial thinning and scar c/w a chronic process. LVEF 9%. RVEF 2% No LV thrombus. Had high PVC burden  >20%. Started on mexiletine with dose increased to 300 mg twice a day . PVC burden reduced. Discharged home w/ LifeVest. Discharged on 07/16/21.   Saw Dr Haroldine Laws yesterday in the HF clinic and he had been doing ok since discharge. EKG - Sinus Rhythm 97 bpm. No PVCs at that time. PVCs suppressed with Mexiletine.    Last night he developed left facial numbness and dysarthric speech. Presented to hospital and CODE Stroke called. CTH w/o contrast with no acute abnormality but concerning for a hyperdense basilar artery. CT Angio with thrombus in distal basilar tip, which appears to be occluding the origin of superior cerebellar arteries BL  Given TNK  followed by attempt at mechanical thrombectomy but the diagnostic angiogram showed poorly recanalization of the distal basilar no thrombectomy necessary.   Neuro deficits improving.  Per Neurology plan for permissive hypertension. This afternoon developed l groin hematoma.    Review of  Systems: [y] = yes, [ ]  = no   General: Weight gain [ ] ; Weight loss [ ] ; Anorexia [ ] ; Fatigue [ y]; Fever [ ] ; Chills [ ] ; Weakness [ ]   Cardiac: Chest pain/pressure [ ] ; Resting SOB [ ] ; Exertional SOB [ ] ; Orthopnea [ ] ; Pedal Edema [ ] ; Palpitations [ ] ; Syncope [ ] ; Presyncope [ ] ; Paroxysmal nocturnal dyspnea[ ]   Pulmonary: Cough [ ] ; Wheezing[ ] ; Hemoptysis[ ] ; Sputum [ ] ; Snoring [ ]   GI: Vomiting[ ] ; Dysphagia[ ] ; Melena[ ] ; Hematochezia [ ] ; Heartburn[ ] ; Abdominal pain [ ] ; Constipation [ ] ; Diarrhea [ ] ; BRBPR [ ]   GU: Hematuria[ ] ; Dysuria [ ] ; Nocturia[ ]   Vascular: Pain in legs with walking [ ] ; Pain in feet with lying flat [ ] ; Non-healing sores [ ] ; Stroke [Y ]; TIA [ ] ; Slurred speech [ ] ;  Neuro: Headaches[ ] ; Vertigo[ ] ; Seizures[ ] ; Paresthesias[ ] ;Blurred vision [ ] ; Diplopia [ ] ; Vision changes [ ]   Ortho/Skin: Arthritis [ ] ; Joint pain [ ] ; Muscle pain [ ] ; Joint swelling [ ] ; Back Pain [ ] ; Rash [ ]   Psych: Depression[ y]; Anxiety[ y]  Heme: Bleeding problems [ ] ; Clotting disorders [ ] ; Anemia [ ]   Endocrine: Diabetes [ ] ; Thyroid dysfunction[ ]   Home Medications Prior to Admission medications   Medication Sig Start Date End Date Taking? Authorizing Provider  acetaminophen (TYLENOL) 500 MG tablet Take 1,000 mg by mouth every 6 (six) hours as needed for moderate pain.   Yes [provider]  albuterol (VENTOLIN HFA) 108 (90 Base) MCG/ACT inhaler Inhale 2 puffs into the lungs every 6 (six) hours as needed for wheezing. 07/11/21  Yes [provider]  digoxin (LANOXIN) 0.125 MG tablet Take 1 tablet (0.125 mg total) by mouth daily. 07/17/21  Yes Clegg, Amy D, NP  empagliflozin (JARDIANCE) 10 MG TABS tablet Take 1 tablet (10 mg total) by mouth daily. 07/17/21  Yes Clegg, Amy D, NP  losartan (COZAAR) 25 MG tablet Take 0.5 tablets (12.5 mg total) by mouth at bedtime. 07/23/21  Yes Addelyn Alleman, Shaune Pascal, MD  mexiletine (MEXITIL) 150 MG capsule Take 2 capsules  (300 mg total) by mouth 2 (two) times daily. 07/16/21  Yes Clegg, Amy D, NP  spironolactone (ALDACTONE) 25 MG tablet Take 1 tablet (25 mg total) by mouth daily. 07/17/21  Yes Clegg, Amy D, NP    Past Medical History: Past Medical History:  Diagnosis Date   Asthma    Cerebrovascular accident (CVA) due to thrombosis of basilar artery ([place]) 07/24/2021   Chronic kidney disease    stones   GERD (gastroesophageal reflux disease)    New onset of congestive heart failure ([place]) 07/11/2021    Past Surgical History: Past Surgical History:  Procedure Laterality Date   ARTERIAL LINE INSERTION N/A 07/12/2021   Procedure: ARTERIAL LINE INSERTION;  Surgeon: Jolaine Artist, MD;  Location: [place] [facility];  Service: Cardiovascular;  Laterality: N/A;   CENTRAL LINE INSERTION  07/12/2021   Procedure: CENTRAL LINE INSERTION;  Surgeon: Jolaine Artist, MD;  Location: [place] [facility];  Service: Cardiovascular;;   RIGHT/LEFT HEART CATH AND CORONARY ANGIOGRAPHY N/A 07/12/2021   Procedure: RIGHT/LEFT HEART CATH AND CORONARY ANGIOGRAPHY;  Surgeon: Jolaine Artist, MD;  Location: [place] [facility];  Service: Cardiovascular;  Laterality: N/A;    Family History: Family History  Problem Relation Age of Onset   Asthma Mother    Cancer Mother        breast   Miscarriages / Stillbirths Mother    Kidney disease Mother        stones   Diabetes Father    Heart disease Father 45        MI   Heart failure Father        LVAD>>Heart Transplant (57s)   Asthma Brother     Social History: Social History   Socioeconomic History   Marital status: Single    Spouse name: Not on file   Number of children: Not on file   Years of education: Not on file   Highest education level: Not on file  Occupational History   Not on file  Tobacco Use   Smoking status: Never   Smokeless tobacco: Never  Substance and Sexual Activity   Alcohol use: No   Drug use: No   Sexual activity: Yes    Birth  control/protection: Pill  Other Topics Concern   Not on file  Social History Narrative   Not on file   Social Determinants of Health   Financial Resource Strain: Not on file  Food Insecurity: Not on file  Transportation Needs: Not on file  Physical Activity: Not on file  Stress: Not on file  Social Connections: Not on file    Allergies:  No Known Allergies  Objective:    Vital Signs:   Temp:  [97.9 F (36.6 C)-99 F (37.2 C)] 98.6 F (37 C) (11/23 1200) Pulse Rate:  [60-125] 60 (11/23 1200) Resp:  [10-24] 18 (11/23 1200) BP: (  84-124)/(58-93) 84/70 (11/23 1200) SpO2:  [94 %-100 %] 95 % (11/23 1200) Weight:  [80.3 kg] 80.3 kg (11/22 2325) Last BM Date:  (PTA)  Weight change: Filed Weights   07/23/21 2325  Weight: 80.3 kg    Intake/Output:   Intake/Output Summary (Last 24 hours) at 07/24/2021 1215 Last data filed at 07/24/2021 1200 Gross per 24 hour  Intake 1536.3 ml  Output 475 ml  Net 1061.3 ml      Physical Exam    General:  Well appearing. No resp difficulty HEENT: normal Neck: supple. JVP . Carotids 2+ bilat; no bruits. No lymphadenopathy or thyromegaly appreciated. Cor: PMI nondisplaced. Regular rate & rhythm. No rubs, gallops or murmurs. Lungs: clear Abdomen: soft, nontender, nondistended. No hepatosplenomegaly. No bruits or masses. Good bowel sounds. Extremities: no cyanosis, clubbing, rash, edema/ Lgroin hematoma.  Neuro: alert & orientedx3, cranial nerves grossly intact. moves all 4 extremities w/o difficulty. Affect pleasant   Telemetry   SR with   EKG    Sinus Tach 102 with occasional PVCs Labs   Basic Metabolic Panel: Recent Labs  Lab 07/23/21 1629 07/24/21 0010 07/24/21 0021 07/24/21 0621  NA 136 134* 137 137  K 4.7 4.0 3.9 3.8  CL 105 103 104 107  CO2 22 19*  --  21*  GLUCOSE 103* 176* 171* 93  BUN 25* 25* 26* 17  CREATININE 1.37* 1.37* 1.30* 1.09  CALCIUM 9.7 9.8  --  9.1    Liver Function Tests: Recent Labs  Lab  07/23/21 1629 07/24/21 0010  AST 31 31  ALT 43 42  ALKPHOS 62 52  BILITOT 1.2 1.3*  PROT 7.6 7.3  ALBUMIN 4.3 4.2   No results for input(s): LIPASE, AMYLASE in the last 168 hours. No results for input(s): AMMONIA in the last 168 hours.  CBC: Recent Labs  Lab 07/24/21 0010 07/24/21 0021 07/24/21 0621  WBC 13.4*  --  9.8  NEUTROABS 12.0*  --  7.3  HGB 15.3 16.7 14.3  HCT 44.9 49.0 42.1  MCV 88.4  --  87.9  PLT 289  --  282    Cardiac Enzymes: No results for input(s): CKTOTAL, CKMB, CKMBINDEX, TROPONINI in the last 168 hours.  BNP: BNP (last 3 results) Recent Labs    07/11/21 1401 07/23/21 1629  BNP 1,884.0* 716.6*    ProBNP (last 3 results) No results for input(s): PROBNP in the last 8760 hours.   CBG: No results for input(s): GLUCAP in the last 168 hours.  Coagulation Studies: Recent Labs    07/24/21 0010  LABPROT 14.4  INR 1.1     Imaging   CT HEAD WO CONTRAST (5MM)  Result Date: 07/24/2021 CLINICAL DATA:  Cerebral hemorrhage suspected EXAM: CT HEAD WITHOUT CONTRAST TECHNIQUE: Contiguous axial images were obtained from the base of the skull through the vertex without intravenous contrast. COMPARISON:  CT head 07/24/2021. FINDINGS: Brain: No acute hemorrhage. No definite acute infarct. No mass, mass effect, or midline shift. No hydrocephalus or extra-axial fluid collection. Vascular: Contrast from recent CTA is noted within the vasculature. Previously noted hyperdense vessel is less apparent on this exam. Skull: Normal. Negative for fracture or focal lesion. Sinuses/Orbits: No acute finding. Other: The mastoid air cells are well aerated. IMPRESSION: No acute hemorrhage. Electronically Signed   By: Merilyn Baba M.D.   On: 07/24/2021 01:23   CT HEAD CODE STROKE WO CONTRAST  Result Date: 07/24/2021 CLINICAL DATA:  Code stroke.  Left facial numbness EXAM: CT HEAD WITHOUT CONTRAST  TECHNIQUE: Contiguous axial images were obtained from the base of the skull  through the vertex without intravenous contrast. COMPARISON:  None. FINDINGS: Brain: Possible subtle hypodensity high right posterior frontal and anterior parietal lobe, although this may be artifactual. No acute hemorrhage, mass, mass effect, or midline shift. Vascular: Hyperdensity in the basilar artery (series 2, image 10). Skull: Normal. Negative for fracture or focal lesion. Sinuses/Orbits: No acute finding. Other: The mastoids are well aerated. ASPECTS [facility] Stroke Program Early CT Score) - Ganglionic level infarction (caudate, lentiform nuclei, internal capsule, insula, M1-M3 cortex): 7 - Supraganglionic infarction (M4-M6 cortex): 3 Total score (0-10 with 10 being normal): 10 IMPRESSION: 1. Hyperdensity in the basilar artery, concerning for thrombus. 2. Possible subtle hypodensity in the high right posterior frontal and anterior parietal lobe, although this may be artifactual. 3. ASPECTS is 10. Code stroke imaging results were communicated on 07/24/2021 at 12:35 am to provider Dr. Lorrin Goodell via telephone, who verbally acknowledged these results. Electronically Signed   By: Merilyn Baba M.D.   On: 07/24/2021 00:37   CT ANGIO HEAD CODE STROKE  Result Date: 07/24/2021 CLINICAL DATA:  Stroke suspected EXAM: CT ANGIOGRAPHY HEAD AND NECK TECHNIQUE: Multidetector CT imaging of the head and neck was performed using the standard protocol during bolus administration of intravenous contrast. Multiplanar CT image reconstructions and MIPs were obtained to evaluate the vascular anatomy. Carotid stenosis measurements (when applicable) are obtained utilizing NASCET criteria, using the distal internal carotid diameter as the denominator. CONTRAST:  67mL OMNIPAQUE IOHEXOL 350 MG/ML SOLN COMPARISON:  No prior CTA, correlation is made with same day CT head FINDINGS: CT HEAD FINDINGS For noncontrast findings, please see same day CT head. CTA NECK FINDINGS Aortic arch: Standard branching. Imaged portion shows no evidence  of aneurysm or dissection. No significant stenosis of the major arch vessel origins. Right carotid system: No evidence of dissection, stenosis (50% or greater) or occlusion. Left carotid system: No evidence of dissection, stenosis (50% or greater) or occlusion. Vertebral arteries: Right dominant. No evidence of dissection, stenosis (50% or greater) or occlusion. Skeleton: Negative. Other neck: Negative. Upper chest: 10 mm ground-glass opacity in the anterior right upper lobe (series 6, image 5). No pleural effusion. Review of the MIP images confirms the above findings CTA HEAD FINDINGS Anterior circulation: Both internal carotid arteries are patent to the termini, without stenosis or other abnormality. A1 segments patent. Normal anterior communicating artery. Anterior cerebral arteries are patent to their distal aspects. No M1 stenosis or occlusion. Normal MCA bifurcations. Distal MCA branches perfused and symmetric. 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 Contrast is noted in the bilateral PCAs, which may indicate distal reconstitution or flow from the posterior communicating arteries, which are not definitively visualized. The bilateral vertebral arteries are patent to the vertebrobasilar junction. Bilateral posterior inferior cerebellar arteries are patent. Venous sinuses: As permitted by contrast timing, patent. Anatomic variants: None significant Review of the MIP images confirms the above findings IMPRESSION: 1. 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 Flow is noted in the bilateral posterior cerebral arteries. 2. No other intracranial large vessel occlusion or significant stenosis. 3. No hemodynamically significant stenosis in the neck. 4. Ground-glass opacity in the partially imaged right upper lobe, which is  nonspecific but could be infectious or inflammatory. Code stroke imaging results were communicated on 07/24/2021 at 12:51 am to provider  Dr. Derry Lory via telephone, who verbally acknowledged these results. Electronically Signed   By: Wiliam Ke M.D.   On: 07/24/2021 00:53   CT ANGIO NECK CODE STROKE  Result Date: 07/24/2021 CLINICAL DATA:  Stroke suspected EXAM: CT ANGIOGRAPHY HEAD AND NECK TECHNIQUE: Multidetector CT imaging of the head and neck was performed using the standard protocol during bolus administration of intravenous contrast. Multiplanar CT image reconstructions and MIPs were obtained to evaluate the vascular anatomy. Carotid stenosis measurements (when applicable) are obtained utilizing NASCET criteria, using the distal internal carotid diameter as the denominator. CONTRAST:  34mL OMNIPAQUE IOHEXOL 350 MG/ML SOLN COMPARISON:  No prior CTA, correlation is made with same day CT head FINDINGS: CT HEAD FINDINGS For noncontrast findings, please see same day CT head. CTA NECK FINDINGS Aortic arch: Standard branching. Imaged portion shows no evidence of aneurysm or dissection. No significant stenosis of the major arch vessel origins. Right carotid system: No evidence of dissection, stenosis (50% or greater) or occlusion. Left carotid system: No evidence of dissection, stenosis (50% or greater) or occlusion. Vertebral arteries: Right dominant. No evidence of dissection, stenosis (50% or greater) or occlusion. Skeleton: Negative. Other neck: Negative. Upper chest: 10 mm ground-glass opacity in the anterior right upper lobe (series 6, image 5). No pleural effusion. Review of the MIP images confirms the above findings CTA HEAD FINDINGS Anterior circulation: Both internal carotid arteries are patent to the termini, without stenosis or other abnormality. A1 segments patent. Normal anterior communicating artery. Anterior cerebral arteries are patent to their distal aspects. No M1 stenosis or occlusion.  Normal MCA bifurcations. Distal MCA branches perfused and symmetric. 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 Contrast is noted in the bilateral PCAs, which may indicate distal reconstitution or flow from the posterior communicating arteries, which are not definitively visualized. The bilateral vertebral arteries are patent to the vertebrobasilar junction. Bilateral posterior inferior cerebellar arteries are patent. Venous sinuses: As permitted by contrast timing, patent. Anatomic variants: None significant Review of the MIP images confirms the above findings IMPRESSION: 1. 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 Flow is noted in the bilateral posterior cerebral arteries. 2. No other intracranial large vessel occlusion or significant stenosis. 3. No hemodynamically significant stenosis in the neck. 4. Ground-glass opacity in the partially imaged right upper lobe, which is nonspecific but could be infectious or inflammatory. Code stroke imaging results were communicated on 07/24/2021 at 12:51 am to provider Dr. Derry Lory via telephone, who verbally acknowledged these results. Electronically Signed   By: Wiliam Ke M.D.   On: 07/24/2021 00:53     Medications:     Current Medications:   stroke: mapping our early stages of recovery book   Does not apply Once   atorvastatin  40 mg Oral Daily   Chlorhexidine Gluconate Cloth  6 each Topical Q0600   digoxin  0.125 mg Oral Daily   empagliflozin  10 mg Oral Daily   mexiletine  300 mg Oral Q12H   pantoprazole (PROTONIX) IV  40 mg Intravenous QHS    Infusions:  sodium chloride     sodium chloride 75 mL/hr at 07/24/21 1200   ceFAZolin        Patient Profile  Thomas Wood is a 25 year old with history of morbid obesity s/p marked weight loss, HFrEF HF EF 10-15%  with LVIDd > 7cm.  Father ([name]) with h/o cardiac arrest/HF due to NICM and has undergone VAD placement (here) and OHTx at [facility].   Admitted with acute stroke.    Assessment/Plan   Acute Stroke -Basilar Artery Thrombus. Suspected cardioembolic event.  -Given tNKthis am  - Improving numbness left lower side of face.  - Started on eliquis + atorvastatin.  -Per Neurology   2. HFrEF with Biventricular HF  Echo 11/22 EF <20%, RV moderately reduced, global HK (no prior study for comparison) - Cath 11/22 Normal cors with distal LAD bridge. Cath complicated by severe vagal episode with near loss of pulse. - cMRI - severe biventricular dysfunciton myocardial thinning and scar. LVEF 9% RVEF 2% (yes 2%) - Concern for familial CM. Possible contribution from frequent PVCs, Father had CM in 86s and had LVAD as bridge to eventual transplant  -Blood type O+  - Volume status stable.  - off GDMT due to acute CVA. Allow for permissive hypertension.   3. PVCs  Continue mexilitene.  Will need to resume Life Vest at d/c  4. L groin Hematoma     Length of Stay: 0  Darrick Grinder, NP  07/24/2021, 12:15 PM  Advanced Heart Failure Team Pager [phone] (M-F; 7a - 5p)  Please contact [facility] for night-coverage after hours (4p -7a ) and weekends on [email or web address]  Patient seen and examined with the above-signed Advanced Practice Provider and/or Housestaff. I personally reviewed laboratory data, imaging studies and relevant notes. I independently examined the patient and formulated the important aspects of the plan. I have edited the note to reflect any of my changes or salient points. I have personally discussed the plan with the patient and/or family.  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 EF 10-15%  Seen in clinic yesterday and was symptomatically improved and in NSR with suppressed PVCs.   Las t night developed stroke like symptoms.  Brought to [facility]. CTA concerning for basilar artery occlusion. Got TNK. Taken for cerebral angio with resolution of clot. (No need for intervention)  This am doing well. No obvious defects. Remains in NSR with frequent PVCs. Developed left groin hematoma which was controlled  ECHO EF 205 no obvious LV clot  General:  Well appearing. No resp difficulty HEENT: normal Neck: supple. no JVD. Carotids 2+ bilat; no bruits. No lymphadenopathy or thryomegaly appreciated. Cor: PMI Laterally displaced. Irregular rate & rhythm. No rubs, gallops or murmurs. Lungs: clear Abdomen: soft, nontender, nondistended. No hepatosplenomegaly. No bruits or masses. Good bowel sounds. Extremities: no cyanosis, clubbing, rash, edema + small left groin hematoma Neuro: alert & orientedx3, cranial nerves grossly intact. moves all 4 extremities w/o difficulty. Affect pleasant  CVA almost certainly due to cardio-embolic event from LV in setting of low EF and blood pooling. He has frequent PVCs but no evidence of AF witnessed during previous hospitalization or this one. Appreciate stroke team's excellent care. Start Eliquis when ok from stroke standpoint. Will resume HF meds as able. Continue mexilitene for PVC suppression.   Glori Bickers, MD  6:05 PM

## 2021-07-24 NOTE — Progress Notes (Addendum)
STROKE TEAM PROGRESS NOTE   SUBJECTIVE (INTERVAL HISTORY) His mother and girlfriend are at the bedside.  Patient is resting comfortably in bed and he remains hemodynamically stable. Patient is currently wearing his life vest and cardiology has been called to notify them of his admission. All questions answered.   Presented with sudden onset of vertigo followed subsequently by numbness in the left face speech difficulties and CT angiogram showed flow In the terminal basilar artery and received IV TNKase followed by attempt at mechanical thrombectomy but the diagnostic angiogram showed poorly recanalization of the distal basilar no thrombectomy necessary.  Blood pressure is adequately controlled.  Neurological exam is mostly improved to baseline mild paresthesias in the left hand and face. OBJECTIVE Vitals:   07/24/21 0900 07/24/21 1000 07/24/21 1100 07/24/21 1200  BP: 112/69 109/67 96/76 (!) 84/70  Pulse: 77 88 80 60  Resp: 14 16 19 18   Temp:    98.6 F (37 C)  TempSrc:    Oral  SpO2: 98% 98% 94% 95%  Weight:      Height:        CBC:  Recent Labs  Lab 07/24/21 0010 07/24/21 0021 07/24/21 0621  WBC 13.4*  --  9.8  NEUTROABS 12.0*  --  7.3  HGB 15.3 16.7 14.3  HCT 44.9 49.0 42.1  MCV 88.4  --  87.9  PLT 289  --  [id number]    Basic Metabolic Panel:  Recent Labs  Lab 07/24/21 0010 07/24/21 0021 07/24/21 0621  NA 134* 137 137  K 4.0 3.9 3.8  CL 103 104 107  CO2 19*  --  21*  GLUCOSE 176* 171* 93  BUN 25* 26* 17  CREATININE 1.37* 1.30* 1.09  CALCIUM 9.8  --  9.1    Lipid Panel:  Recent Labs  Lab 07/24/21 0621  CHOL 159  TRIG 50  HDL 35*  CHOLHDL 4.5  VLDL 10  LDLCALC 114*   HgbA1c:  Lab Results  Component Value Date   HGBA1C 4.9 07/24/2021   Urine Drug Screen:     Component Value Date/Time   LABOPIA NONE DETECTED 07/24/2021 0551   COCAINSCRNUR NONE DETECTED 07/24/2021 0551   LABBENZ NONE DETECTED 07/24/2021 0551   AMPHETMU NONE DETECTED 07/24/2021 0551    THCU POSITIVE (A) 07/24/2021 0551   LABBARB NONE DETECTED 07/24/2021 0551    Alcohol Level     Component Value Date/Time   ETH <10 07/24/2021 0010    IMAGING  Results for orders placed or performed during the hospital encounter of 07/23/21  CT HEAD WO CONTRAST (5MM)   Narrative   CLINICAL DATA:  Cerebral hemorrhage suspected  EXAM: CT HEAD WITHOUT CONTRAST  TECHNIQUE: Contiguous axial images were obtained from the base of the skull through the vertex without intravenous contrast.  COMPARISON:  CT head 07/24/2021.  FINDINGS: Brain: No acute hemorrhage. No definite acute infarct. No mass, mass effect, or midline shift. No hydrocephalus or extra-axial fluid collection.  Vascular: Contrast from recent CTA is noted within the vasculature. Previously noted hyperdense vessel is less apparent on this exam.  Skull: Normal. Negative for fracture or focal lesion.  Sinuses/Orbits: No acute finding.  Other: The mastoid air cells are well aerated.  IMPRESSION: No acute hemorrhage.   Electronically Signed   By: Merilyn Baba M.D.   On: 07/24/2021 01:23     PHYSICAL EXAM  Physical Exam  Constitutional: [name] not in distress Psych: Affect appropriate to situation Eyes: No scleral injection HENT:  No OP obstrucion MSK: no joint deformities.  Cardiovascular: intermittently tachycardic, multifocal PVCs and PACs Respiratory: Effort normal, non-labored breathing GI: Soft.  No distension. There is no tenderness.  Skin: WDI  Neuro: Mental Status: Patient is awake, alert, oriented to person, place, month, year, and situation. Patient is able to give a clear and coherent history. No signs of aphasia or neglect Cranial Nerves: II: Visual Fields are full. Pupils are equal, round, and reactive to light.   III,IV, VI: EOMI without ptosis or diploplia.  V: Gradually improving numbness on the left lower side of the face.  VII: Facial movement is symmetric  resting and smiling VIII: Hearing is intact to voice X: Palate elevates symmetrically XI: Shoulder shrug is symmetric. XII: Tongue protrudes midline without atrophy or fasciculations.  Motor: Tone is normal. Bulk is normal. 5/5 strength was present in all four extremities.  Sensory: Sensation is symmetric to light touch and temperature in the arms and legs. No extinction to DSS present.  Deep Tendon Reflexes: 2+ and symmetric in the biceps and patellae.  Plantars: Toes are downgoing bilaterally.  Cerebellar: FNF and HKS are intact bilaterally  NIHSS 1  ASSESSMENT/PLAN Mr. KALYAN BARABAS is a 25 y.o. male with history of asthma, GERD, obesity with marked weight loss, and new onset acute CHF with an EF of 10-15% who presented with left facial numbness, dysarthria, and a dysconjugate gaze. He reports that his symptoms started around 2200 on 07/23/21. He called EMS but then decided to come to the hospital himself after speaking with his family. A code stroke was activated on arrival to the hospital. [facility] - Eat w/o contrast with no acute abnormality but concerning for a hyperdense basilar artery. 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  LKW: 07/23/2021 at 2200 TNK: Yes Thrombectomy: Initially decided to hold off on thrombectomy and take him for emergent intervention if his symptoms were to worsen. Symptoms worsened and ED activated code IR based on earlier discussion with patient. He did improve shortly afterwards with NIHSS of 3 again but given the concern for fluctuating symptoms, we felt it was better to take him to IR with hope to minimize ischemic any ischemic infarct. Once in IR, no stenosis, occlusions, filling defects or dissections seen. No intervention done . 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 CT Head 0115- no acute hemorrhagic transformation CTA Neck: -Focal occlusion of the distal basilar artery, which likely affects the origins of the bilateral superior cerebellar arteries. Flow is noted in the bilateral posterior cerebral arteries. MRI head Pending 2D Echo  EF less than 10-15%- dilated cardiomyopathy LDL 114 HgbA1c 4.9 SCDs for VTE prophylaxis No antithrombotic prior to admission, now on Eliquis (apixaban) daily.  Patient counseled to be compliant with his antithrombotic medications Ongoing aggressive stroke risk factor management Therapy recommendations:  pending Disposition:  pending- neuro ICU  Hypertension Stable  Permissive hypertension (OK if < 220/120) but gradually normalize in 5-7 days  Long-term BP goal normotensive  Hyperlipidemia Home meds:  None LDL 114, goal < 70 Add atorvastatin Continue statin at discharge  Diabetes type II HgbA1c 4.9, goal < 7.0 Controlled  Other Stroke Risk Factors Family hx stroke (Father) Coronary artery disease  Other Active Problems New onset congestive heart failure Lifevest in place Cardiology consulted- Patient is seen by  Dr. Haroldine Laws on 11/222022 cMRI showed severe biventricular dysfunction w/ severe LV dilation and globally elevated ECV signal, w/ myocardial thinning and scar c/w a chronic process. LVEF 9%. RVEF 2% No LV thrombus.   Hospital day # 0   Pt seen by NP/Neuro and by MD. Note/plan to be edited by MD as needed.  Janine Ores, FNP-BC [facility]  STROKE MD NOTE :  I have personally obtained history,examined this patient, reviewed notes, independently viewed imaging studies, participated in medical decision making and plan of care.ROS completed by me personally and pertinent positives fully documented  I have made any additions or clarifications directly to the above note. Agree with note above.  Patient presented with sudden onset of vertigo subsequently  followed by numbness and speech difficulties due to likely embolism to the top of the basilar and fortunately received IV TNKase with good recanalization resolution of most of his deficits.  Diagnostic cerebral catheter angiogram was performed but no thrombectomy was necessary.  Continue close neurological monitoring and strict blood pressure control as per post ENT protocol.  Mobilize out of bed as tolerated.  Cardiology consultation and manage his cardiomyopathy as well as likely start anticoagulation tomorrow.  Discussed with Dr. Estanislado Pandy interventional radiologist and Dr. Haroldine Laws cardiology.  Long discussion with patient's mother and girlfriend at the bedside and answered questions.This patient is critically ill and at significant risk of neurological worsening, death and care requires constant monitoring of vital signs, hemodynamics,respiratory and cardiac monitoring, extensive review of multiple databases, frequent neurological assessment, discussion with family, other specialists and medical decision making of high complexity.I have made any additions or clarifications directly to the above note.This critical care time does not reflect procedure time, or teaching time or supervisory time of PA/NP/Med Resident etc but could involve care discussion time.  I spent 40 minutes of neurocritical care time  in the care of  this patient.      Antony Contras, MD Medical Director [place] Pager: [phone] 07/24/2021 3:44 PM    To contact Stroke Continuity provider, please refer to [email or web address] After hours, contact General Neurology

## 2021-07-24 NOTE — Anesthesia Procedure Notes (Signed)
Procedure Name: Intubation Date/Time: 07/24/2021 1:43 AM Performed by: Adair Laundry, CRNA Pre-anesthesia Checklist: Suction available, Emergency Drugs available, Patient identified and Patient being monitored Patient Re-evaluated:Patient Re-evaluated prior to induction Oxygen Delivery Method: Circle system utilized Preoxygenation: Pre-oxygenation with 100% oxygen Induction Type: IV induction, Rapid sequence and Cricoid Pressure applied Laryngoscope Size: Miller and 2 Grade View: Grade I Tube type: Oral Tube size: 8.0 mm Number of attempts: 1 Airway Equipment and Method: Stylet Placement Confirmation: ETT inserted through vocal cords under direct vision, positive ETCO2 and breath sounds checked- equal and bilateral Secured at: 24 cm Tube secured with: Tape Dental Injury: Teeth and Oropharynx as per pre-operative assessment

## 2021-07-24 NOTE — Progress Notes (Signed)
eLink Physician-Brief Progress Note Patient Name: Thomas Wood DOB: 10/13/1995 MRN: [id number]   Date of Service  07/24/2021  HPI/Events of Note  Pt with recent dx of new onset biventricular heart failure  Here with an ischemic cva due to a basilar artery thrombus  Received thrombolytics and had a cerebral angiogram which did not show any continued occlusion  eICU Interventions  In icu for post op neuro monitoring.  Alert; on nasal canula; family at bedside     Intervention Category Evaluation Type: New Patient Evaluation  Jacinta Shoe 07/24/2021, 3:18 AM

## 2021-07-24 NOTE — Anesthesia Postprocedure Evaluation (Signed)
Anesthesia Post Note  Patient: Thomas Wood  Procedure(s) Performed: RADIOLOGY WITH ANESTHESIA     Patient location during evaluation: ICU Anesthesia Type: General Level of consciousness: awake Pain management: pain level controlled Vital Signs Assessment: post-procedure vital signs reviewed and stable Respiratory status: spontaneous breathing Cardiovascular status: stable Postop Assessment: no apparent nausea or vomiting Anesthetic complications: no   No notable events documented.  Last Vitals:  Vitals:   07/24/21 0630 07/24/21 0645  BP: 96/69 97/62  Pulse: 81 83  Resp: 11 11  Temp:    SpO2: 96% 96%    Last Pain:  Vitals:   07/24/21 0630  TempSrc:   PainSc: 4                  Jahmez Bily

## 2021-07-24 NOTE — Procedures (Signed)
S/P 4 vessel cerebral arteriogram Lt CFA approach. Findings. 1.No stenosis,occlusions,filling defects  or dissections seen. 37F angioseal for Lt CFA hemostasis. Distal pulses all intact.  Extubated. Fully awake ,alert. Appropriately conversant. Denies any visual symptoms. No facial asymmetry. Tongue midline. Moves all 4s equally . S.Sheamus Hasting MD

## 2021-07-25 ENCOUNTER — Inpatient Hospital Stay (HOSPITAL_COMMUNITY): Payer: 59

## 2021-07-25 ENCOUNTER — Encounter (HOSPITAL_COMMUNITY): Payer: Self-pay | Admitting: Interventional Radiology

## 2021-07-25 DIAGNOSIS — I5020 Unspecified systolic (congestive) heart failure: Secondary | ICD-10-CM | POA: Diagnosis not present

## 2021-07-25 DIAGNOSIS — I5082 Biventricular heart failure: Secondary | ICD-10-CM | POA: Diagnosis not present

## 2021-07-25 DIAGNOSIS — I724 Aneurysm of artery of lower extremity: Secondary | ICD-10-CM

## 2021-07-25 DIAGNOSIS — I651 Occlusion and stenosis of basilar artery: Secondary | ICD-10-CM | POA: Diagnosis not present

## 2021-07-25 LAB — PROTIME-INR
INR: 1.2 (ref 0.8–1.2)
Prothrombin Time: 15.2 seconds (ref 11.4–15.2)

## 2021-07-25 LAB — COMPREHENSIVE METABOLIC PANEL
ALT: 30 U/L (ref 0–44)
AST: 57 U/L — ABNORMAL HIGH (ref 15–41)
Albumin: 3.4 g/dL — ABNORMAL LOW (ref 3.5–5.0)
Alkaline Phosphatase: 48 U/L (ref 38–126)
Anion gap: 9 (ref 5–15)
BUN: 10 mg/dL (ref 6–20)
CO2: 19 mmol/L — ABNORMAL LOW (ref 22–32)
Calcium: 8.9 mg/dL (ref 8.9–10.3)
Chloride: 108 mmol/L (ref 98–111)
Creatinine, Ser: 1.04 mg/dL (ref 0.61–1.24)
GFR, Estimated: >60 mL/min (ref >60)
Glucose, Bld: 80 mg/dL (ref 70–99)
Potassium: 3.9 mmol/L (ref 3.5–5.1)
Sodium: 136 mmol/L (ref 135–145)
Total Bilirubin: 2.2 mg/dL — ABNORMAL HIGH (ref 0.3–1.2)
Total Protein: 6.3 g/dL — ABNORMAL LOW (ref 6.5–8.1)

## 2021-07-25 LAB — CBC WITH DIFFERENTIAL/PLATELET
Abs Immature Granulocytes: 0.02 10*3/uL (ref 0.00–0.07)
Basophils Absolute: 0.1 10*3/uL (ref 0.0–0.1)
Basophils Relative: 1 %
Eosinophils Absolute: 0 10*3/uL (ref 0.0–0.5)
Eosinophils Relative: 0 %
HCT: 40 % (ref 39.0–52.0)
Hemoglobin: 12.9 g/dL — ABNORMAL LOW (ref 13.0–17.0)
Immature Granulocytes: 0 %
Lymphocytes Relative: 27 %
Lymphs Abs: 2.1 10*3/uL (ref 0.7–4.0)
MCH: 29.5 pg (ref 26.0–34.0)
MCHC: 32.3 g/dL (ref 30.0–36.0)
MCV: 91.3 fL (ref 80.0–100.0)
Monocytes Absolute: 0.9 10*3/uL (ref 0.1–1.0)
Monocytes Relative: 11 %
Neutro Abs: 4.8 10*3/uL (ref 1.7–7.7)
Neutrophils Relative %: 61 %
Platelets: 243 10*3/uL (ref 150–400)
RBC: 4.38 MIL/uL (ref 4.22–5.81)
RDW: 12.8 % (ref 11.5–15.5)
WBC: 7.9 10*3/uL (ref 4.0–10.5)
nRBC: 0 % (ref 0.0–0.2)

## 2021-07-25 LAB — APTT: aPTT: 27 seconds (ref 24–36)

## 2021-07-25 MED ORDER — MIDODRINE HCL 5 MG PO TABS
2.5000 mg | ORAL_TABLET | Freq: Two times a day (BID) | ORAL | Status: DC
  Administered 2021-07-25 – 2021-07-27 (×4): 2.5 mg via ORAL
  Filled 2021-07-25 (×4): qty 1

## 2021-07-25 MED ORDER — ACETAMINOPHEN 500 MG PO TABS
1000.0000 mg | ORAL_TABLET | Freq: Four times a day (QID) | ORAL | Status: DC | PRN
  Administered 2021-07-25 – 2021-07-27 (×2): 1000 mg via ORAL
  Filled 2021-07-25 (×2): qty 2

## 2021-07-25 MED ORDER — ACETAMINOPHEN 650 MG RE SUPP: 650.0000 mg | RECTAL | Status: DC | PRN

## 2021-07-25 MED ORDER — TRAMADOL HCL 50 MG PO TABS
50.0000 mg | ORAL_TABLET | Freq: Four times a day (QID) | ORAL | Status: DC | PRN
  Filled 2021-07-25 (×2): qty 1

## 2021-07-25 MED ORDER — POTASSIUM CHLORIDE ER 10 MEQ PO TBCR
20.0000 meq | EXTENDED_RELEASE_TABLET | Freq: Once | ORAL | Status: AC
  Administered 2021-07-25: 20 meq via ORAL
  Filled 2021-07-25: qty 2

## 2021-07-25 MED ORDER — AMIODARONE HCL 200 MG PO TABS
400.0000 mg | ORAL_TABLET | Freq: Two times a day (BID) | ORAL | Status: DC
  Administered 2021-07-25 – 2021-07-28 (×7): 400 mg via ORAL
  Filled 2021-07-25 (×7): qty 2

## 2021-07-25 MED ORDER — ENOXAPARIN SODIUM 40 MG/0.4ML IJ SOSY
40.0000 mg | PREFILLED_SYRINGE | INTRAMUSCULAR | Status: DC
  Administered 2021-07-25: 40 mg via SUBCUTANEOUS
  Filled 2021-07-25: qty 0.4

## 2021-07-25 MED ORDER — ACETAMINOPHEN 160 MG/5ML PO SOLN: 650.0000 mg | ORAL | Status: DC | PRN

## 2021-07-25 NOTE — Progress Notes (Signed)
Patient developed hematoma at 0545. Marked boundaries. Applied pressure for 30 min and laid patient flat per Dr. Ezzie Dural. Dr. Ezzie Dural recommended patient lay flat for several hours.

## 2021-07-25 NOTE — Progress Notes (Signed)
VASCULAR LAB    Right groin ultrasound to rule out pseudoaneurysm has been performed.  See CV proc for preliminary results.   Justus Droke, RVT 07/25/2021, 9:54 AM

## 2021-07-25 NOTE — Progress Notes (Addendum)
Advanced Heart Failure Rounding Note   Subjective:    Had recurrent groin hematoma this am. Manual pressure held x 30 mins. Now on bedrest. Eliquis held.   Denies CP, SOB, orthopnea or PND.   Facial numbness resolved.  Still with frequent PVCs 20-30/min on tele    Objective:   Weight Range:  Vital Signs:   Temp:  [97.9 F (36.6 C)-98.7 F (37.1 C)] 98.7 F (37.1 C) (11/24 0800) Pulse Rate:  [43-94] 58 (11/24 1100) Resp:  [12-23] 18 (11/24 1100) BP: (75-116)/(53-84) 89/55 (11/24 1100) SpO2:  [92 %-100 %] 93 % (11/24 1100) Last BM Date:  (PTA)  Weight change: Filed Weights   07/23/21 2325  Weight: 80.3 kg    Intake/Output:   Intake/Output Summary (Last 24 hours) at 07/25/2021 1156 Last data filed at 07/25/2021 1100 Gross per 24 hour  Intake 2026.68 ml  Output 2025 ml  Net 1.68 ml     Physical Exam: General:  Lying in bed No resp difficulty HEENT: normal Neck: supple. no JVD. Carotids 2+ bilat; no bruits. No lymphadenopathy or thryomegaly appreciated. Cor: PMI nondisplaced. Irregular. No rubs, gallops or murmurs. Lungs: clear Abdomen: soft, nontender, nondistended. No hepatosplenomegaly. No bruits or masses. Good bowel sounds. Extremities: no cyanosis, clubbing, rash, edema snall left groin hematoma Neuro: alert & orientedx3, cranial nerves grossly intact. moves all 4 extremities w/o difficulty. Affect pleasant  Telemetry: Sinus with frequent PVCs 20-30/min Personally reviewed   Labs: Basic Metabolic Panel: Recent Labs  Lab 07/23/21 1629 07/24/21 0010 07/24/21 0021 07/24/21 0621 07/25/21 0928  NA 136 134* 137 137 136  K 4.7 4.0 3.9 3.8 3.9  CL 105 103 104 107 108  CO2 22 19*  --  21* 19*  GLUCOSE 103* 176* 171* 93 80  BUN 25* 25* 26* 17 10  CREATININE 1.37* 1.37* 1.30* 1.09 1.04  CALCIUM 9.7 9.8  --  9.1 8.9    Liver Function Tests: Recent Labs  Lab 07/23/21 1629 07/24/21 0010 07/25/21 0928  AST 31 31 57*  ALT 43 42 30  ALKPHOS  62 52 48  BILITOT 1.2 1.3* 2.2*  PROT 7.6 7.3 6.3*  ALBUMIN 4.3 4.2 3.4*   No results for input(s): LIPASE, AMYLASE in the last 168 hours. No results for input(s): AMMONIA in the last 168 hours.  CBC: Recent Labs  Lab 07/24/21 0010 07/24/21 0021 07/24/21 0621 07/25/21 0928  WBC 13.4*  --  9.8 7.9  NEUTROABS 12.0*  --  7.3 4.8  HGB 15.3 16.7 14.3 12.9*  HCT 44.9 49.0 42.1 40.0  MCV 88.4  --  87.9 91.3  PLT 289  --  282 243    Cardiac Enzymes: No results for input(s): CKTOTAL, CKMB, CKMBINDEX, TROPONINI in the last 168 hours.  BNP: BNP (last 3 results) Recent Labs    07/11/21 1401 07/23/21 1629  BNP 1,884.0* 716.6*    ProBNP (last 3 results) No results for input(s): PROBNP in the last 8760 hours.    Other results:  Imaging: CT HEAD WO CONTRAST (5MM)  Result Date: 07/25/2021 CLINICAL DATA:  Stroke-like symptoms with findings of prior distal basilar artery occlusion on CTA of the head. EXAM: CT HEAD WITHOUT CONTRAST TECHNIQUE: Contiguous axial images were obtained from the base of the skull through the vertex without intravenous contrast. COMPARISON:  07/24/2021 FINDINGS: Brain: No evidence of acute infarction, hemorrhage, hydrocephalus, extra-axial collection or mass lesion/mass effect. Vascular: No hyperdense vessel or unexpected calcification. Skull: Normal. Negative for fracture or focal  lesion. Sinuses/Orbits: No acute finding. Other: None. IMPRESSION: No acute intracranial abnormality noted. Electronically Signed   By: Inez Catalina M.D.   On: 07/25/2021 00:16   CT HEAD WO CONTRAST (5MM)  Result Date: 07/24/2021 CLINICAL DATA:  Cerebral hemorrhage suspected EXAM: CT HEAD WITHOUT CONTRAST TECHNIQUE: Contiguous axial images were obtained from the base of the skull through the vertex without intravenous contrast. COMPARISON:  CT head 07/24/2021. FINDINGS: Brain: No acute hemorrhage. No definite acute infarct. No mass, mass effect, or midline shift. No hydrocephalus or  extra-axial fluid collection. Vascular: Contrast from recent CTA is noted within the vasculature. Previously noted hyperdense vessel is less apparent on this exam. Skull: Normal. Negative for fracture or focal lesion. Sinuses/Orbits: No acute finding. Other: The mastoid air cells are well aerated. IMPRESSION: No acute hemorrhage. Electronically Signed   By: Merilyn Baba M.D.   On: 07/24/2021 01:23   MR BRAIN WO CONTRAST  Result Date: 07/25/2021 CLINICAL DATA:  Stroke-like symptoms, dizziness, vision changes, weakness EXAM: MRI HEAD WITHOUT CONTRAST TECHNIQUE: Multiplanar, multiecho pulse sequences of the brain and surrounding structures were obtained without intravenous contrast. COMPARISON:  No prior MRI head, correlation is made with 07/25/2021 CT head FINDINGS: Brain: Foci of restricted diffusion in the left frontal lobe cortex (series 5, image 85), left posterior temporal and occipital lobe (series 5, image 64 and 66), right pons (series 5, image 3), and right cerebellum (series 5, image 57), which all demonstrate ADC correlates and are consistent with acute infarcts. No acute hemorrhage, mass, mass effect, or midline shift. No extra-axial collection or hydrocephalus. Vascular: Normal flow voids. Skull and upper cervical spine: Normal marrow signal. Sinuses/Orbits: Mucosal thickening in the left sphenoid sinus. The orbits are unremarkable. Other: The mastoids are well aerated. IMPRESSION: Acute infarcts in left frontal, temporal, and occipital lobes, as well as the right pons and right cerebellum. Given multiple vascular territories, an embolic etiology is suspected. Electronically Signed   By: Merilyn Baba M.D.   On: 07/25/2021 00:29   VAS [place] GROIN PSEUDOANEURYSM  Result Date: 07/25/2021  ARTERIAL PSEUDOANEURYSM  Patient Name:  TAVAR LEVANT  Date of Exam:   07/25/2021 Medical Rec #: [id number]          Accession #:    [id number] Date of Birth: 09-23-1995           Patient Gender: M Patient Age:    60 years Exam Location:  [facility] Procedure:      VAS [place] GROIN PSEUDOANEURYSM Referring Phys: Luanne Bras --------------------------------------------------------------------------------  Exam: Left groin Indications: Patient complains of hematoma post thrombectomy in IR and TPA. Comparison Study: No prior study Performing Technologist: Sharion Dove RVS  Examination Guidelines: A complete evaluation includes B-mode imaging, spectral Doppler, color Doppler, and power Doppler as needed of all accessible portions of each vessel. Bilateral testing is considered an integral part of a complete examination. Limited examinations for reoccurring indications may be performed as noted.  Summary: No evidence of pseudoaneurysm, AVF or DVT. There is hematoma noted.    --------------------------------------------------------------------------------    Preliminary    ECHOCARDIOGRAM COMPLETE  Result Date: 07/24/2021    ECHOCARDIOGRAM REPORT   Patient Name:   KRAVEN COOPRIDER Date of Exam: 07/24/2021 Medical Rec #:  [id number]         Height:       71.0 in Accession #:    [id number]        Weight:       177.0 lb Date of  Birth:  1995-11-16          BSA:          2.002 m Patient Age:    25 years          BP:           101/68 mmHg Patient Gender: M                 HR:           91 bpm. Exam Location:  Inpatient Procedure: Limited Echo, Cardiac Doppler, Color Doppler and Intracardiac            Opacification Agent Indications:    Stroke  History:        Patient has prior history of Echocardiogram examinations, most                 recent 07/12/2021. Stroke; Signs/Symptoms:Shortness of Breath.  Sonographer:    Glo Herring Referring Phys: [id number] [place]  1. Left ventricular ejection fraction, by estimation, is <20%. The left ventricle has severely decreased function. The left ventricle demonstrates global hypokinesis. The left ventricular internal cavity size was severely dilated. Left  ventricular diastolic parameters are consistent with Grade II diastolic dysfunction (pseudonormalization). Elevated left atrial pressure.  2. No LV thrombus seen. There is contrast swirling at apex suggesting sluggish flow but no thrombus seen  3. Right ventricular systolic function is mildly reduced. The right ventricular size is mildly enlarged.  4. The mitral valve is normal in structure. Mild mitral valve regurgitation.  5. The aortic valve was not well visualized. Aortic valve regurgitation is trivial.  6. The inferior vena cava is dilated in size with <50% respiratory variability, suggesting right atrial pressure of 15 mmHg. FINDINGS  Left Ventricle: Left ventricular ejection fraction, by estimation, is <20%. The left ventricle has severely decreased function. The left ventricle demonstrates global hypokinesis. The left ventricular internal cavity size was severely dilated. There is no left ventricular hypertrophy. Left ventricular diastolic parameters are consistent with Grade II diastolic dysfunction (pseudonormalization). Elevated left atrial pressure. Right Ventricle: The right ventricular size is mildly enlarged. Right vetricular wall thickness was not well visualized. Right ventricular systolic function is mildly reduced. Left Atrium: Left atrial size was not assessed. Right Atrium: Right atrial size was not assessed. Pericardium: Trivial pericardial effusion is present. Mitral Valve: The mitral valve is normal in structure. Mild mitral valve regurgitation. Tricuspid Valve: The tricuspid valve is grossly normal. Tricuspid valve regurgitation is trivial. Aortic Valve: The aortic valve was not well visualized. Aortic valve regurgitation is trivial. Pulmonic Valve: The pulmonic valve was not well visualized. Pulmonic valve regurgitation is not visualized. Aorta: The aortic root is normal in size and structure. Venous: The inferior vena cava is dilated in size with less than 50% respiratory variability,  suggesting right atrial pressure of 15 mmHg. IAS/Shunts: The interatrial septum was not well visualized.  LEFT VENTRICLE PLAX 2D LVIDd:         8.00 cm      Diastology LVIDs:         7.00 cm      LV e' medial:    5.98 cm/s LV PW:         0.90 cm      LV E/e' medial:  16.1 LV IVS:        1.00 cm      LV e' lateral:   7.94 cm/s  LV E/e' lateral: 12.1  LV Volumes (MOD) LV vol d, MOD A2C: 346.0 ml LV vol d, MOD A4C: 385.0 ml LV vol s, MOD A2C: 273.0 ml LV vol s, MOD A4C: 312.0 ml LV SV MOD A2C:     73.0 ml LV SV MOD A4C:     385.0 ml LV SV MOD BP:      72.9 ml IVC IVC diam: 2.20 cm LEFT ATRIUM         Index LA diam:    4.60 cm 2.30 cm/m  MITRAL VALVE MV Area (PHT): 5.42 cm MV Decel Time: 140 msec MV E velocity: 96.00 cm/s MV A velocity: 77.10 cm/s MV E/A ratio:  1.25 Epifanio Lesches MD Electronically signed by Epifanio Lesches MD Signature Date/Time: 07/24/2021/12:23:09 PM    Final    CT HEAD CODE STROKE WO CONTRAST  Result Date: 07/24/2021 CLINICAL DATA:  Code stroke.  Left facial numbness EXAM: CT HEAD WITHOUT CONTRAST TECHNIQUE: Contiguous axial images were obtained from the base of the skull through the vertex without intravenous contrast. COMPARISON:  None. FINDINGS: Brain: Possible subtle hypodensity high right posterior frontal and anterior parietal lobe, although this may be artifactual. No acute hemorrhage, mass, mass effect, or midline shift. Vascular: Hyperdensity in the basilar artery (series 2, image 10). Skull: Normal. Negative for fracture or focal lesion. Sinuses/Orbits: No acute finding. Other: The mastoids are well aerated. ASPECTS [facility] Stroke Program Early CT Score) - Ganglionic level infarction (caudate, lentiform nuclei, internal capsule, insula, M1-M3 cortex): 7 - Supraganglionic infarction (M4-M6 cortex): 3 Total score (0-10 with 10 being normal): 10 IMPRESSION: 1. Hyperdensity in the basilar artery, concerning for thrombus. 2. Possible subtle  hypodensity in the high right posterior frontal and anterior parietal lobe, although this may be artifactual. 3. ASPECTS is 10. Code stroke imaging results were communicated on 07/24/2021 at 12:35 am to provider Dr. Derry Lory via telephone, who verbally acknowledged these results. Electronically Signed   By: Wiliam Ke M.D.   On: 07/24/2021 00:37   CT ANGIO HEAD CODE STROKE  Result Date: 07/24/2021 CLINICAL DATA:  Stroke suspected EXAM: CT ANGIOGRAPHY HEAD AND NECK TECHNIQUE: Multidetector CT imaging of the head and neck was performed using the standard protocol during bolus administration of intravenous contrast. Multiplanar CT image reconstructions and MIPs were obtained to evaluate the vascular anatomy. Carotid stenosis measurements (when applicable) are obtained utilizing NASCET criteria, using the distal internal carotid diameter as the denominator. CONTRAST:  32mL OMNIPAQUE IOHEXOL 350 MG/ML SOLN COMPARISON:  No prior CTA, correlation is made with same day CT head FINDINGS: CT HEAD FINDINGS For noncontrast findings, please see same day CT head. CTA NECK FINDINGS Aortic arch: Standard branching. Imaged portion shows no evidence of aneurysm or dissection. No significant stenosis of the major arch vessel origins. Right carotid system: No evidence of dissection, stenosis (50% or greater) or occlusion. Left carotid system: No evidence of dissection, stenosis (50% or greater) or occlusion. Vertebral arteries: Right dominant. No evidence of dissection, stenosis (50% or greater) or occlusion. Skeleton: Negative. Other neck: Negative. Upper chest: 10 mm ground-glass opacity in the anterior right upper lobe (series 6, image 5). No pleural effusion. Review of the MIP images confirms the above findings CTA HEAD FINDINGS Anterior circulation: Both internal carotid arteries are patent to the termini, without stenosis or other abnormality. A1 segments patent. Normal anterior communicating artery. Anterior cerebral  arteries are patent to their distal aspects. No M1 stenosis or occlusion. Normal MCA bifurcations. Distal MCA branches perfused and  symmetric. 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 Contrast is noted in the bilateral PCAs, which may indicate distal reconstitution or flow from the posterior communicating arteries, which are not definitively visualized. The bilateral vertebral arteries are patent to the vertebrobasilar junction. Bilateral posterior inferior cerebellar arteries are patent. Venous sinuses: As permitted by contrast timing, patent. Anatomic variants: None significant Review of the MIP images confirms the above findings IMPRESSION: 1. 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 Flow is noted in the bilateral posterior cerebral arteries. 2. No other intracranial large vessel occlusion or significant stenosis. 3. No hemodynamically significant stenosis in the neck. 4. Ground-glass opacity in the partially imaged right upper lobe, which is nonspecific but could be infectious or inflammatory. Code stroke imaging results were communicated on 07/24/2021 at 12:51 am to provider Dr. Derry Lory via telephone, who verbally acknowledged these results. Electronically Signed   By: Wiliam Ke M.D.   On: 07/24/2021 00:53   CT ANGIO NECK CODE STROKE  Result Date: 07/24/2021 CLINICAL DATA:  Stroke suspected EXAM: CT ANGIOGRAPHY HEAD AND NECK TECHNIQUE: Multidetector CT imaging of the head and neck was performed using the standard protocol during bolus administration of intravenous contrast. Multiplanar CT image reconstructions and MIPs were obtained to evaluate the vascular anatomy. Carotid stenosis measurements (when applicable) are obtained utilizing NASCET criteria, using the distal internal carotid diameter as the  denominator. CONTRAST:  74mL OMNIPAQUE IOHEXOL 350 MG/ML SOLN COMPARISON:  No prior CTA, correlation is made with same day CT head FINDINGS: CT HEAD FINDINGS For noncontrast findings, please see same day CT head. CTA NECK FINDINGS Aortic arch: Standard branching. Imaged portion shows no evidence of aneurysm or dissection. No significant stenosis of the major arch vessel origins. Right carotid system: No evidence of dissection, stenosis (50% or greater) or occlusion. Left carotid system: No evidence of dissection, stenosis (50% or greater) or occlusion. Vertebral arteries: Right dominant. No evidence of dissection, stenosis (50% or greater) or occlusion. Skeleton: Negative. Other neck: Negative. Upper chest: 10 mm ground-glass opacity in the anterior right upper lobe (series 6, image 5). No pleural effusion. Review of the MIP images confirms the above findings CTA HEAD FINDINGS Anterior circulation: Both internal carotid arteries are patent to the termini, without stenosis or other abnormality. A1 segments patent. Normal anterior communicating artery. Anterior cerebral arteries are patent to their distal aspects. No M1 stenosis or occlusion. Normal MCA bifurcations. Distal MCA branches perfused and symmetric. 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 Contrast is noted in the bilateral PCAs, which may indicate distal reconstitution or flow from the posterior communicating arteries, which are not definitively visualized. The bilateral vertebral arteries are patent to the vertebrobasilar junction. Bilateral posterior inferior cerebellar arteries are patent. Venous sinuses: As permitted by contrast timing, patent. Anatomic variants: None significant Review of the MIP images confirms the above findings IMPRESSION: 1. 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 Flow is noted in the bilateral posterior cerebral arteries. 2. No other intracranial large vessel occlusion or significant stenosis. 3. No hemodynamically significant stenosis in the neck. 4. Ground-glass opacity in the partially imaged right upper lobe, which is nonspecific but could be infectious or inflammatory. Code stroke imaging results were communicated on  07/24/2021 at 12:51 am to provider Dr. Lorrin Goodell via telephone, who verbally acknowledged these results. Electronically Signed   By: Merilyn Baba M.D.   On: 07/24/2021 00:53     Medications:     Scheduled Medications:   stroke: mapping our early stages of recovery book   Does not apply Once   apixaban  5 mg Oral BID   atorvastatin  40 mg Oral Daily   Chlorhexidine Gluconate Cloth  6 each Topical Q0600   digoxin  0.125 mg Oral Daily   empagliflozin  10 mg Oral Daily   mexiletine  300 mg Oral Q12H   pantoprazole (PROTONIX) IV  40 mg Intravenous QHS    Infusions:  sodium chloride     sodium chloride 75 mL/hr at 07/25/21 1100    PRN Medications: acetaminophen **OR** acetaminophen (TYLENOL) oral liquid 160 mg/5 mL **OR** acetaminophen, senna-docusate   Assessment/Plan:    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  - Resolved with TNK  No LVO on cath - Numbness resolved - Holding Eliquis due to groin bleed.  - Continue statin  - Appreciate Neuro care   2. HFrEF with Biventricular HF  Echo 11/22 EF <20%, RV moderately reduced, global HK (no prior study for comparison) - Cath 11/22 Normal cors with distal LAD bridge. Cath complicated by severe vagal episode with near loss of pulse. - cMRI - severe biventricular dysfunciton myocardial thinning and scar. LVEF 9% RVEF 2% (yes 2%) - Concern for familial CM. Possible contribution from frequent PVCs, Father had CM in 81s and had LVAD as bridge to eventual transplant  -Blood type O+  - Volume status stable.  -  Continue digoxin and jardiance. - Will add back spiro as BP tolerates    3. PVCs  - PVCs were suppressed on mexilitene but now back up to 20-30/min - start amio - Keep K > 4.0 Mg > 2.0 - Continue lifevest   4. L groin Hematoma  - rebled this am - hgb stable  - site looks good now - u/s without PSA - holding eliquis  5. DVT prophylaxis - will start low-dose enox while off Eliquis - continue TED hose - d/w Neuro  Length of Stay: 1   Glori Bickers MD 07/25/2021, 11:56 AM  Advanced Heart Failure Team Pager [phone] (M-F; 7a - 4p)  Please contact [facility] for night-coverage after hours (4p -7a ) and weekends on [email or web address]

## 2021-07-25 NOTE — Progress Notes (Signed)
Patient due for 24 hour MRI follow up at 0000 on 07/25/21. Patient wearing cardiac life vest, contacted [facility] and they said it needs to come off of patient for MRI. Contacted cardiology fellow and he granted permission to remove  vest for MRI.

## 2021-07-25 NOTE — Progress Notes (Signed)
PT Cancellation Note  Patient Details Name: MATHEO RATHBONE MRN: [id number] DOB: 10/06/1995   Cancelled Treatment:    Reason Eval/Treat Not Completed: Active bedrest order   Enedina Finner Wirt Hemmerich 07/25/2021, 7:22 AM Merryl Hacker, PT Acute Rehabilitation Services Pager: [phone] Office: [phone]

## 2021-07-25 NOTE — Progress Notes (Addendum)
STROKE TEAM PROGRESS NOTE   SUBJECTIVE (INTERVAL HISTORY) His  girlfriend is at the bedside.  Patient is resting comfortably in bed and he remains hemodynamically stable. Patient is currently wearing his life vest. Patient reports his residual numbness has subsided. He developed a left groin hematoma on 11/23 that further expanded over night.  But has stabilized with local pressure this AM.  Groin ultrasound was completed and shows only superficial hematoma without pseudoaneurysm or aVF.  As well as stat labs. Interventional radiology notified and recommended that the patient remains flat for today and we are going to hold off on antiplatelet and anticoagulant agents for now. He is able to sit up slightly to eat but should minimize bending at the hip.  Repeat CBC ordered for 1600 as well as AM labs. MRI showed tiny right cerebellar, right brainstem, left occipital as well as tiny left temporal and frontal infarcts and results discussed with patient and girlfriend and all questions answered.   OBJECTIVE Vitals:   07/25/21 0500 07/25/21 0600 07/25/21 0700 07/25/21 0800  BP: 103/64 (!) 91/58 97/70 96/62   Pulse: (!) 43 62 (!) 47 (!) 52  Resp: 19 15 (!) 23 18  Temp:    98.7 F (37.1 C)  TempSrc:    Axillary  SpO2: 97% 99% 98% 97%  Weight:      Height:        CBC:  Recent Labs  Lab 07/24/21 0621 07/25/21 0928  WBC 9.8 7.9  NEUTROABS 7.3 4.8  HGB 14.3 12.9*  HCT 42.1 40.0  MCV 87.9 91.3  PLT 282 243     Basic Metabolic Panel:  Recent Labs  Lab 07/24/21 0010 07/24/21 0021 07/24/21 0621  NA 134* 137 137  K 4.0 3.9 3.8  CL 103 104 107  CO2 19*  --  21*  GLUCOSE 176* 171* 93  BUN 25* 26* 17  CREATININE 1.37* 1.30* 1.09  CALCIUM 9.8  --  9.1     Lipid Panel:  Recent Labs  Lab 07/24/21 0621  CHOL 159  TRIG 50  HDL 35*  CHOLHDL 4.5  VLDL 10  LDLCALC 07/26/21*    HgbA1c:  Lab Results  Component Value Date   HGBA1C 4.9 07/24/2021   Urine Drug Screen:     Component  Value Date/Time   LABOPIA NONE DETECTED 07/24/2021 0551   COCAINSCRNUR NONE DETECTED 07/24/2021 0551   LABBENZ NONE DETECTED 07/24/2021 0551   AMPHETMU NONE DETECTED 07/24/2021 0551   THCU POSITIVE (A) 07/24/2021 0551   LABBARB NONE DETECTED 07/24/2021 0551    Alcohol Level     Component Value Date/Time   ETH <10 07/24/2021 0010    IMAGING  Results for orders placed or performed during the hospital encounter of 07/23/21  CT HEAD WO CONTRAST (07/25/21)   Narrative   CLINICAL DATA:  Cerebral hemorrhage suspected  EXAM: CT HEAD WITHOUT CONTRAST  TECHNIQUE: Contiguous axial images were obtained from the base of the skull through the vertex without intravenous contrast.  COMPARISON:  CT head 07/24/2021.  FINDINGS: Brain: No acute hemorrhage. No definite acute infarct. No mass, mass effect, or midline shift. No hydrocephalus or extra-axial fluid collection.  Vascular: Contrast from recent CTA is noted within the vasculature. Previously noted hyperdense vessel is less apparent on this exam.  Skull: Normal. Negative for fracture or focal lesion.  Sinuses/Orbits: No acute finding.  Other: The mastoid air cells are well aerated.  IMPRESSION: No acute hemorrhage.   Electronically Signed   By: 07/26/2021  Vasan M.D.   On: 07/24/2021 01:23     PHYSICAL EXAM  Physical Exam  Constitutional: [name] not in distress Psych: Affect appropriate to situation Eyes: No scleral injection HENT: No OP obstrucion MSK: no joint deformities.  Cardiovascular: intermittently tachycardic, multifocal PVCs and PACs Respiratory: Effort normal, non-labored breathing GI: Soft.  No distension. There is no tenderness.  Skin: WDI  Neuro: Mental Status: Patient is awake, alert, oriented to person, place, month, year, and situation. Patient is able to give a clear and coherent history. No signs of aphasia or neglect Cranial Nerves: II: Visual Fields are full. Pupils are  equal, round, and reactive to light.   III,IV, VI: EOMI without ptosis or diploplia.  V: No numbness or paresthesia in face VII: Facial movement is symmetric resting and smiling VIII: Hearing is intact to voice X: Palate elevates symmetrically XI: Shoulder shrug is symmetric. XII: Tongue protrudes midline without atrophy or fasciculations.  Motor: Tone is normal. Bulk is normal. 5/5 strength was present in all four extremities.  Sensory: Sensation is symmetric to light touch and temperature in the arms and legs. No extinction to DSS present.  Deep Tendon Reflexes: 2+ and symmetric in the biceps and patellae.  Plantars: Toes are downgoing bilaterally.  Cerebellar: FNF and HKS are intact bilaterally  NIHSS 0  ASSESSMENT/PLAN [name] is a 25 y.o. male with history of asthma, GERD, obesity with marked weight loss, and new onset acute CHF with an EF of 10-15% who presented with left facial numbness, dysarthria, and a dysconjugate gaze. He reports that his symptoms started around 2200 on 07/23/21. He called EMS but then decided to come to the hospital himself after speaking with his family. A code stroke was activated on arrival to the hospital. [facility] w/o contrast with no acute abnormality but concerning for a hyperdense basilar artery. 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  LKW: 07/23/2021 at 2200 TNK: Yes Thrombectomy: Initially decided to hold off on thrombectomy and take him for emergent intervention if his symptoms were to worsen. Symptoms worsened and ED activated code IR based on earlier discussion with patient. He did improve shortly afterwards with NIHSS of 3 again but given the concern for fluctuating symptoms, we felt it was better to take him to IR with hope to minimize ischemic any ischemic infarct. Once in IR, no stenosis, occlusions, filling defects or dissections seen. No intervention  done . 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 CT Head 0115- no acute hemorrhagic transformation CTA Neck: -Focal occlusion of the distal basilar artery, which likely affects the origins of the bilateral superior cerebellar arteries. Flow is noted in the bilateral posterior cerebral arteries. MRI head 11/24- Acute infarcts in left frontal, temporal, and occipital lobes, as well as the right pons and right cerebellum. 2D Echo  EF less than 10-15%- dilated cardiomyopathy LDL 114 HgbA1c 4.9 SCDs for VTE prophylaxis No antithrombotic prior to admission, hold anticoagulation because of expanding groin hematoma Patient counseled to be compliant with his antithrombotic medications Ongoing aggressive stroke risk factor management Therapy recommendations:  pending Disposition:  pending- neuro ICU  Hypotension Stable  No vasopressors needed overnight  Long-term BP goal normotensive  Hyperlipidemia Home meds:  None LDL 114, goal < 70 Add atorvastatin Continue statin at discharge  Diabetes type II HgbA1c 4.9, goal < 7.0 Controlled  Other Stroke Risk Factors Family hx stroke (Father) Coronary artery disease  Other Active Problems New onset congestive heart failure Lifevest in place Cardiology consulted- Patient is seen by Dr. Haroldine Laws on 11/222022 cMRI showed severe biventricular dysfunction w/ severe LV dilation and globally elevated ECV signal, w/ myocardial thinning and scar c/w a chronic process. LVEF 9%. RVEF 2% No LV thrombus.  PVCs- digoxin and mexiletine- home medications Bensimhon adding amio to help manage PVCs Left groin hematoma VAS [place] Lower extremity- No evidence of pseudoaneurysm, AVF or DVT. There is hematoma noted.  H.H- 12.9/40.0- Repeat CBC ordered for later this afternoon Original site marked by RN Patient to remain bedrest  for today-11/24   Hospital day # 1   Pt seen by NP/Neuro and by MD. Note/plan to be edited by MD as needed.  Janine Ores, FNP-BC [facility]  STROKE MD NOTE :  I have personally obtained history,examined this patient, reviewed notes, independently viewed imaging studies, participated in medical decision making and plan of care.ROS completed by me personally and pertinent positives fully documented  I have made any additions or clarifications directly to the above note. Agree with note above.  Patient unfortunately reapplied into the left groin earlier this morning local pressure seems to have stabilized it.  Groin ultrasound does not show any pseudoaneurysm.  Continue bedrest for today and mobilized gradually tomorrow as tolerated.  Continue cautious hydration and blood pressure and hemodynamic management as per cardiology team.  Hold Eliquis till hematoma is stable.  Start Lovenox for DVT prophylaxis.  Discussed with Dr. Estanislado Pandy and Dr. Haroldine Laws.  Long discussion with the patient and girlfriend the bedside and answered questions.This patient is critically ill and at significant risk of neurological worsening, death and care requires constant monitoring of vital signs, hemodynamics,respiratory and cardiac monitoring, extensive review of multiple databases, frequent neurological assessment, discussion with family, other specialists and medical decision making of high complexity.I have made any additions or clarifications directly to the above note.This critical care time does not reflect procedure time, or teaching time or supervisory time of PA/NP/Med Resident etc but could involve care discussion time.  I spent 30 minutes of neurocritical care time  in the care of  this patient.      Antony Contras, MD Medical Director [place] Pager: [phone] 07/25/2021 1:24 PM   To contact Stroke Continuity provider, please refer to [email or web address] After hours, contact General  Neurology

## 2021-07-26 ENCOUNTER — Other Ambulatory Visit (HOSPITAL_COMMUNITY): Payer: Self-pay

## 2021-07-26 DIAGNOSIS — I651 Occlusion and stenosis of basilar artery: Secondary | ICD-10-CM | POA: Diagnosis not present

## 2021-07-26 DIAGNOSIS — I5082 Biventricular heart failure: Secondary | ICD-10-CM | POA: Diagnosis not present

## 2021-07-26 DIAGNOSIS — I5020 Unspecified systolic (congestive) heart failure: Secondary | ICD-10-CM | POA: Diagnosis not present

## 2021-07-26 LAB — BASIC METABOLIC PANEL
Anion gap: 8 (ref 5–15)
BUN: 11 mg/dL (ref 6–20)
CO2: 21 mmol/L — ABNORMAL LOW (ref 22–32)
Calcium: 8.8 mg/dL — ABNORMAL LOW (ref 8.9–10.3)
Chloride: 109 mmol/L (ref 98–111)
Creatinine, Ser: 1.12 mg/dL (ref 0.61–1.24)
GFR, Estimated: >60 mL/min (ref >60)
Glucose, Bld: 83 mg/dL (ref 70–99)
Potassium: 3.9 mmol/L (ref 3.5–5.1)
Sodium: 138 mmol/L (ref 135–145)

## 2021-07-26 LAB — CBC
HCT: 40.2 % (ref 39.0–52.0)
Hemoglobin: 12.9 g/dL — ABNORMAL LOW (ref 13.0–17.0)
MCH: 29.3 pg (ref 26.0–34.0)
MCHC: 32.1 g/dL (ref 30.0–36.0)
MCV: 91.4 fL (ref 80.0–100.0)
Platelets: 243 10*3/uL (ref 150–400)
RBC: 4.4 MIL/uL (ref 4.22–5.81)
RDW: 12.7 % (ref 11.5–15.5)
WBC: 7 10*3/uL (ref 4.0–10.5)
nRBC: 0 % (ref 0.0–0.2)

## 2021-07-26 LAB — MAGNESIUM: Magnesium: 1.8 mg/dL (ref 1.7–2.4)

## 2021-07-26 LAB — PHOSPHORUS: Phosphorus: 3.3 mg/dL (ref 2.5–4.6)

## 2021-07-26 MED ORDER — POTASSIUM CHLORIDE CRYS ER 20 MEQ PO TBCR
20.0000 meq | EXTENDED_RELEASE_TABLET | Freq: Once | ORAL | Status: AC
  Administered 2021-07-26: 20 meq via ORAL
  Filled 2021-07-26: qty 1

## 2021-07-26 MED ORDER — MIDODRINE HCL 2.5 MG PO TABS
2.5000 mg | ORAL_TABLET | Freq: Two times a day (BID) | ORAL | 1 refills | Status: DC
  Filled 2021-07-26: qty 60, 30d supply, fill #0

## 2021-07-26 MED ORDER — MEXILETINE HCL 150 MG PO CAPS
300.0000 mg | ORAL_CAPSULE | Freq: Two times a day (BID) | ORAL | 1 refills | Status: DC
  Filled 2021-07-26: qty 60, 15d supply, fill #0

## 2021-07-26 MED ORDER — MAGNESIUM SULFATE 2 GM/50ML IV SOLN
2.0000 g | Freq: Once | INTRAVENOUS | Status: AC
  Administered 2021-07-26: 2 g via INTRAVENOUS
  Filled 2021-07-26: qty 50

## 2021-07-26 MED ORDER — LACTATED RINGERS IV SOLN: INTRAVENOUS | Status: DC

## 2021-07-26 MED ORDER — APIXABAN 5 MG PO TABS
5.0000 mg | ORAL_TABLET | Freq: Two times a day (BID) | ORAL | Status: DC
  Administered 2021-07-26 – 2021-07-28 (×5): 5 mg via ORAL
  Filled 2021-07-26 (×5): qty 1

## 2021-07-26 MED ORDER — APIXABAN 5 MG PO TABS
5.0000 mg | ORAL_TABLET | Freq: Two times a day (BID) | ORAL | 1 refills | Status: DC
  Filled 2021-07-26: qty 60, 30d supply, fill #0

## 2021-07-26 MED ORDER — AMIODARONE HCL 200 MG PO TABS
400.0000 mg | ORAL_TABLET | Freq: Two times a day (BID) | ORAL | 1 refills | Status: DC
  Filled 2021-07-26: qty 120, 30d supply, fill #0

## 2021-07-26 MED ORDER — ATORVASTATIN CALCIUM 40 MG PO TABS
40.0000 mg | ORAL_TABLET | Freq: Every day | ORAL | 1 refills | Status: DC
  Filled 2021-07-26: qty 30, 30d supply, fill #0

## 2021-07-26 MED ORDER — ACETAMINOPHEN 160 MG/5ML PO SUSP
650.0000 mg | ORAL | 0 refills | Status: DC | PRN
  Filled 2021-07-26: qty 118, 1d supply, fill #0

## 2021-07-26 MED ORDER — EMPAGLIFLOZIN 10 MG PO TABS
10.0000 mg | ORAL_TABLET | Freq: Every day | ORAL | 1 refills | Status: DC
  Filled 2021-07-26: qty 30, 30d supply, fill #0

## 2021-07-26 NOTE — Discharge Instructions (Signed)

## 2021-07-26 NOTE — Progress Notes (Signed)
Advanced Heart Failure Rounding Note   Subjective:    Sitting in chair. Denies CP or SOB. No further bleeding at groin site.  Continues with frequent PVCs on tele.   Objective:   Weight Range:  Vital Signs:   Temp:  [97.5 F (36.4 C)-98.5 F (36.9 C)] 98.3 F (36.8 C) (11/25 0800) Pulse Rate:  [60-97] 86 (11/25 1100) Resp:  [11-25] 13 (11/25 1100) BP: (87-117)/(57-89) 111/89 (11/25 1100) SpO2:  [88 %-100 %] 98 % (11/25 1100) Last BM Date:  (PTA)  Weight change: Filed Weights   07/23/21 2325  Weight: 80.3 kg    Intake/Output:   Intake/Output Summary (Last 24 hours) at 07/26/2021 1202 Last data filed at 07/26/2021 0700 Gross per 24 hour  Intake 1883.94 ml  Output 1950 ml  Net -66.06 ml      Physical Exam: General:  Well appearing. No resp difficulty HEENT: normal Neck: supple. no JVD. Carotids 2+ bilat; no bruits. No lymphadenopathy or thryomegaly appreciated. Cor: PMI nondisplaced. Regular rate & rhythm. No rubs, gallops or murmurs. Lungs: clear Abdomen: soft, nontender, nondistended. No hepatosplenomegaly. No bruits or masses. Good bowel sounds. Extremities: no cyanosis, clubbing, rash, edema  Small left groin hematoma Neuro: alert & orientedx3, cranial nerves grossly intact. moves all 4 extremities w/o difficulty. Affect pleasant  Telemetry: Sinus 80-100 with frequent PVCs ~20/min Personally reviewed   Labs: Basic Metabolic Panel: Recent Labs  Lab 07/23/21 1629 07/24/21 0010 07/24/21 0021 07/24/21 0621 07/25/21 0928 07/26/21 0309  NA 136 134* 137 137 136 138  K 4.7 4.0 3.9 3.8 3.9 3.9  CL 105 103 104 107 108 109  CO2 22 19*  --  21* 19* 21*  GLUCOSE 103* 176* 171* 93 80 83  BUN 25* 25* 26* 17 10 11   CREATININE 1.37* 1.37* 1.30* 1.09 1.04 1.12  CALCIUM 9.7 9.8  --  9.1 8.9 8.8*  MG  --   --   --   --   --  1.8  PHOS  --   --   --   --   --  3.3     Liver Function Tests: Recent Labs  Lab 07/23/21 1629 07/24/21 0010 07/25/21 0928   AST 31 31 57*  ALT 43 42 30  ALKPHOS 62 52 48  BILITOT 1.2 1.3* 2.2*  PROT 7.6 7.3 6.3*  ALBUMIN 4.3 4.2 3.4*    No results for input(s): LIPASE, AMYLASE in the last 168 hours. No results for input(s): AMMONIA in the last 168 hours.  CBC: Recent Labs  Lab 07/24/21 0010 07/24/21 0021 07/24/21 0621 07/25/21 0928 07/26/21 0309  WBC 13.4*  --  9.8 7.9 7.0  NEUTROABS 12.0*  --  7.3 4.8  --   HGB 15.3 16.7 14.3 12.9* 12.9*  HCT 44.9 49.0 42.1 40.0 40.2  MCV 88.4  --  87.9 91.3 91.4  PLT 289  --  282 243 243     Cardiac Enzymes: No results for input(s): CKTOTAL, CKMB, CKMBINDEX, TROPONINI in the last 168 hours.  BNP: BNP (last 3 results) Recent Labs    07/11/21 1401 07/23/21 1629  BNP 1,884.0* 716.6*     ProBNP (last 3 results) No results for input(s): PROBNP in the last 8760 hours.    Other results:  Imaging: CT HEAD WO CONTRAST (5MM)  Result Date: 07/25/2021 CLINICAL DATA:  Stroke-like symptoms with findings of prior distal basilar artery occlusion on CTA of the head. EXAM: CT HEAD WITHOUT CONTRAST TECHNIQUE: Contiguous axial images  were obtained from the base of the skull through the vertex without intravenous contrast. COMPARISON:  07/24/2021 FINDINGS: Brain: No evidence of acute infarction, hemorrhage, hydrocephalus, extra-axial collection or mass lesion/mass effect. Vascular: No hyperdense vessel or unexpected calcification. Skull: Normal. Negative for fracture or focal lesion. Sinuses/Orbits: No acute finding. Other: None. IMPRESSION: No acute intracranial abnormality noted. Electronically Signed   By: Inez Catalina M.D.   On: 07/25/2021 00:16   MR BRAIN WO CONTRAST  Result Date: 07/25/2021 CLINICAL DATA:  Stroke-like symptoms, dizziness, vision changes, weakness EXAM: MRI HEAD WITHOUT CONTRAST TECHNIQUE: Multiplanar, multiecho pulse sequences of the brain and surrounding structures were obtained without intravenous contrast. COMPARISON:  No prior MRI head,  correlation is made with 07/25/2021 CT head FINDINGS: Brain: Foci of restricted diffusion in the left frontal lobe cortex (series 5, image 85), left posterior temporal and occipital lobe (series 5, image 64 and 66), right pons (series 5, image 3), and right cerebellum (series 5, image 57), which all demonstrate ADC correlates and are consistent with acute infarcts. No acute hemorrhage, mass, mass effect, or midline shift. No extra-axial collection or hydrocephalus. Vascular: Normal flow voids. Skull and upper cervical spine: Normal marrow signal. Sinuses/Orbits: Mucosal thickening in the left sphenoid sinus. The orbits are unremarkable. Other: The mastoids are well aerated. IMPRESSION: Acute infarcts in left frontal, temporal, and occipital lobes, as well as the right pons and right cerebellum. Given multiple vascular territories, an embolic etiology is suspected. Electronically Signed   By: Merilyn Baba M.D.   On: 07/25/2021 00:29   VAS [place] GROIN PSEUDOANEURYSM  Result Date: 07/26/2021  ARTERIAL PSEUDOANEURYSM  Patient Name:  Thomas Wood  Date of Exam:   07/25/2021 Medical Rec #: [id number]          Accession #:    [id number] Date of Birth: 1995-09-09           Patient Gender: M Patient Age:   25 years Exam [facility]  [facility] Procedure:      VAS [place] GROIN PSEUDOANEURYSM Referring Phys: Luanne Bras --------------------------------------------------------------------------------  Exam: Left groin Indications: Patient complains of hematoma post thrombectomy in IR and TPA. Comparison Study: No prior study Performing Technologist: Sharion Dove RVS  Examination Guidelines: A complete evaluation includes B-mode imaging, spectral Doppler, color Doppler, and power Doppler as needed of all accessible portions of each vessel. Bilateral testing is considered an integral part of a complete examination. Limited examinations for reoccurring indications may be performed as noted.  Summary: No evidence of  pseudoaneurysm, AVF or DVT. There is hematoma noted.  Diagnosing physician: Orlie Pollen Electronically signed by Orlie Pollen on 07/26/2021 at 10:49:34 AM.   --------------------------------------------------------------------------------    Final      Medications:     Scheduled Medications:   stroke: mapping our early stages of recovery book   Does not apply Once   amiodarone  400 mg Oral BID   apixaban  5 mg Oral BID   atorvastatin  40 mg Oral Daily   Chlorhexidine Gluconate Cloth  6 each Topical Q0600   digoxin  0.125 mg Oral Daily   empagliflozin  10 mg Oral Daily   mexiletine  300 mg Oral Q12H   midodrine  2.5 mg Oral BID WC   pantoprazole (PROTONIX) IV  40 mg Intravenous QHS   potassium chloride  20 mEq Oral Once    Infusions:  sodium chloride 75 mL/hr at 07/26/21 0700   magnesium sulfate bolus IVPB      PRN  Medications: acetaminophen **OR** acetaminophen (TYLENOL) oral liquid 160 mg/5 mL **OR** acetaminophen, senna-docusate, traMADol   Assessment/Plan:    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  - Resolved with TNK  No LVO on invasive angio - Numbness resolved - Eliquis started - Continue statin  - Appreciate Neuro care   2. HFrEF with Biventricular HF  Echo 11/22 EF <20%, RV moderately reduced, global HK (no prior study for comparison) - Cath 11/22 Normal cors with distal LAD bridge. Cath complicated by severe vagal episode with near loss of pulse. - cMRI - severe biventricular dysfunciton myocardial thinning and scar. LVEF 9% RVEF 2% (yes 2%) - Concern for familial CM. Possible contribution from frequent PVCs, Father had CM in 65s and had LVAD as bridge to eventual transplant  -Blood type O+  - Volume status stable.  - Continue digoxin and jardiance. - Will add back spiro   3. PVCs  - PVCs were suppressed on mexilitene but now back up to 20-30/min - Amio started 11/24. Will continue - Keep K > 4.0 Mg > 2.0. Will supp   - Continue lifevest - Would not send home until PVCs are more suppressed   4. L groin Hematoma  - site looks good now - u/s without PSA   Length of Stay: 2   Arvilla Meres MD 07/26/2021, 12:02 PM  Advanced Heart Failure Team Pager [phone] (M-F; 7a - 4p)  Please contact [facility] for night-coverage after hours (4p -7a ) and weekends on [email or web address]

## 2021-07-26 NOTE — Evaluation (Signed)
Physical Therapy Evaluation Patient Details Name: Thomas Wood MRN: [id number] DOB: [birth year] Today's Date: 07/26/2021  History of Present Illness  25 y.o. male admitted with left facial numbness, dysarthric speech & dysconjugate gaze. 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 +tNKase. Symptoms worsened and pt underwent thrombectomy. PMH significant for asthma, GERD, obesity s/p marked weight loss, new onset acute CHF with EF of 10-15%  Clinical Impression  Patient admitted with above diagnosis. Patient presents with weakness and decreased activity tolerance. Patient supervision for mobility with no AD. MD to hold stair negotiation this date due to recent hematoma and to "take it easy". Anticipate with patient will negotiate stairs with ease. Patient will benefit from skilled PT services during acute stay to address listed deficits. No PT follow up recommended at this time.      Recommendations for follow up therapy are one component of a multi-disciplinary discharge planning process, led by the attending physician.  Recommendations may be updated based on patient status, additional functional criteria and insurance authorization.  Follow Up Recommendations No PT follow up    Assistance Recommended at Discharge PRN  Functional Status Assessment Patient has had a recent decline in their functional status and demonstrates the ability to make significant improvements in function in a reasonable and predictable amount of time.  Equipment Recommendations  None recommended by PT    Recommendations for Other Services       Precautions / Restrictions Precautions Precaution Comments: L groin hematoma Restrictions Weight Bearing Restrictions: No      Mobility  Bed Mobility Overal bed mobility: Modified Independent                  Transfers Overall transfer level: Modified independent                      Ambulation/Gait Ambulation/Gait assistance: Supervision Gait  Distance (Feet): 300 Feet Assistive device: None Gait Pattern/deviations: Step-through pattern Gait velocity: decreased        Stairs            Wheelchair Mobility    Modified Rankin (Stroke Patients Only)       Balance Overall balance assessment: No apparent balance deficits (not formally assessed) (able to perform partial DGI but uable to complete due to MD holding stair negotiation this date)                                           Pertinent Vitals/Pain Pain Assessment: No/denies pain    Home Living Family/patient expects to be discharged to:: Private residence Living Arrangements: Spouse/significant other Available Help at Discharge: Friend(s) Type of Home: Apartment Home Access: Stairs to enter Entrance Stairs-Rails: Left Entrance Stairs-Number of Steps: flight   Home Layout: One level Home Equipment: None      Prior Function Prior Level of Function : Independent/Modified Independent;Working/employed (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)                     Higher education careers adviser        Extremity/Trunk Assessment   Upper Extremity Assessment Upper Extremity Assessment: Overall WFL for tasks assessed    Lower Extremity Assessment Lower Extremity Assessment: Overall WFL for tasks assessed       Communication   Communication: No difficulties  Cognition Arousal/Alertness: Awake/alert Behavior During Therapy: [facility] for  tasks assessed/performed Overall Cognitive Status: Within Functional Limits for tasks assessed                                 General Comments: pt making 1 error on pillbox test.        General Comments General comments (skin integrity, edema, etc.): VSS    Exercises     Assessment/Plan    PT Assessment Patient needs continued PT services  PT Problem List Decreased strength;Decreased mobility;Cardiopulmonary status limiting activity       PT  Treatment Interventions Gait training;Stair training;Therapeutic exercise;Balance training;Patient/family education    PT Goals (Current goals can be found in the Care Plan section)  Acute Rehab PT Goals Patient Stated Goal: to go home PT Goal Formulation: With patient Time For Goal Achievement: 08/02/21 Potential to Achieve Goals: Good    Frequency Min 4X/week   Barriers to discharge        Co-evaluation               AM-PAC PT "6 Clicks" Mobility  Outcome Measure Help needed turning from your back to your side while in a flat bed without using bedrails?: None Help needed moving from lying on your back to sitting on the side of a flat bed without using bedrails?: None Help needed moving to and from a bed to a chair (including a wheelchair)?: None Help needed standing up from a chair using your arms (e.g., wheelchair or bedside chair)?: None Help needed to walk in hospital room?: A Little Help needed climbing 3-5 steps with a railing? : A Little 6 Click Score: 22    End of Session   Activity Tolerance: Patient tolerated treatment well Patient left: in chair;with call bell/phone within reach;with family/visitor present Nurse Communication: Mobility status PT Visit Diagnosis: Muscle weakness (generalized) (M62.81)    Time: [id number] PT Time Calculation (min) (ACUTE ONLY): 9 min   Charges:   PT Evaluation $PT Eval Low Complexity: 1 Low          Darleny Sem A. Gilford Rile PT, DPT Acute Rehabilitation Services Pager [phone] Office [phone]   Linna Hoff 07/26/2021, 11:07 AM

## 2021-07-26 NOTE — Progress Notes (Signed)
Occupational Therapy Treatment Patient Details Name: EUGEAN ARNOTT MRN: [id number] DOB: [birth year] Today's Date: 07/26/2021   History of present illness 25 y.o. male admitted with left facial numbness, dysarthric speech & dysconjugate gaze. 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 +tNKase. Symptoms worsened and pt underwent thrombectomy. PMH significant for asthma, GERD, obesity s/p marked weight loss, new onset acute CHF with EF of 10-15%   OT comments  Pt is at adequate level to d/c home from OT stand point. Pt has med goals and will d/c from OT services. Pt demonstrate MODI  transfer and adls. Pt's groin site remained stable throughout session. OT signing off.    Recommendations for follow up therapy are one component of a multi-disciplinary discharge planning process, led by the attending physician.  Recommendations may be updated based on patient status, additional functional criteria and insurance authorization.    Follow Up Recommendations  No OT follow up    Assistance Recommended at Discharge Set up Supervision/Assistance  Equipment Recommendations  BSC/3in1    Recommendations for Other Services      Precautions / Restrictions Precautions Precaution Comments: L groin hematoma       Mobility Bed Mobility Overal bed mobility: Modified Independent                  Transfers Overall transfer level: Modified independent                       Balance Overall balance assessment: No apparent balance deficits (not formally assessed)                                         ADL either performed or assessed with clinical judgement   ADL Overall ADL's : Modified independent                                            Extremity/Trunk Assessment Upper Extremity Assessment Upper Extremity Assessment: Overall WFL for tasks assessed   Lower Extremity Assessment Lower Extremity Assessment: Overall WFL for tasks assessed         Vision   Vision Assessment?: No apparent visual deficits   Perception     Praxis      Cognition Arousal/Alertness: Awake/alert Behavior During Therapy: WFL for tasks assessed/performed Overall Cognitive Status: Impaired/Different from baseline                                 General Comments: pt making 1 error on pillbox test.          Exercises     Shoulder Instructions       General Comments VSS- pt reports not feeling that life vest pads are touching properly and RN made aware. RN addressing at the end of session    Pertinent Vitals/ Pain       Pain Assessment: No/denies pain  Home Living                                          Prior Functioning/Environment              Frequency  Min 2X/week        Progress Toward Goals  OT Goals(current goals can now be found in the care plan section)  Progress towards OT goals: Goals met/education completed, patient discharged from OT  Acute Rehab OT Goals Patient Stated Goal: to go home soon OT Goal Formulation: With patient Time For Goal Achievement: 08/07/21 Potential to Achieve Goals: Good ADL Goals Pt Will Perform Lower Body Dressing: with modified independence;sit to/from stand Pt Will Transfer to Toilet: with modified independence Additional ADL Goal #1: Pt will independently state 3 energy conservation strategies Additional ADL Goal #2: Pt will recieve a "pass" score on teh PillBox assessment of executive level functioning  Plan All goals met and education completed, patient discharged from OT services    Co-evaluation                 AM-PAC OT "6 Clicks" Daily Activity     Outcome Measure   Help from another person eating meals?: None Help from another person taking care of personal grooming?: None Help from another person toileting, which includes using toliet, bedpan, or urinal?: None Help from another person bathing (including washing, rinsing,  drying)?: None Help from another person to put on and taking off regular upper body clothing?: None Help from another person to put on and taking off regular lower body clothing?: None 6 Click Score: 24    End of Session    OT Visit Diagnosis: Unsteadiness on feet (R26.81);Pain Pain - Right/Left: Left   Activity Tolerance Patient tolerated treatment well   Patient Left in chair;with call bell/phone within reach;with family/visitor present;with chair alarm set   Nurse Communication Mobility status;Precautions        Time: 1000-1018 OT Time Calculation (min): 18 min  Charges: OT General Charges $OT Visit: 1 Visit OT Treatments $Self Care/Home Management : 8-22 mins   Brynn, OTR/L  Acute Rehabilitation Services Pager: [phone] Office: [phone] .   Jeri Modena 07/26/2021, 11:02 AM Functional cognition further assessed with The Pillbox Test: A Measure of Executive Functioning and Estimate of Medication Management. A straight pass/fail designation is determined by 3 or more errors of omission or misplacement on the task. The pt completed the test with less than 3 errors. See cognition section below. Pt's error was missing two medications by placing them in double previous two days. Pt able to recognize error and states "I always have her check behind me" referring to girlfriend.  Fleeta Emmer, OTR/L  Acute Rehabilitation Services Pager: [phone] Office: [phone] .

## 2021-07-26 NOTE — TOC Initial Note (Signed)
Transition of Care [facility]) - Initial/Assessment Note    Patient Details  Name: Thomas Wood MRN: [id number] Date of Birth: 03/20/1996  [facility] CM/SW Contact:    Elliot Cousin, RN Phone Number: [phone] 07/26/2021, 3:36 PM  Clinical Narrative:                  HF TOC CM spoke to pt at bedside. States no DME needed. Has meds from [facility] pharmacy at bedside. Lives at home with girlfriend.     Expected Discharge Plan: Home/Self Care Barriers to Discharge: No Barriers Identified   Patient Goals and CMS Choice        Expected Discharge Plan and Services Expected Discharge Plan: Home/Self Care In-house Referral: Clinical Social Work Discharge Planning Services: CM Consult   Living arrangements for the past 2 months: Apartment   Prior Living Arrangements/Services Living arrangements for the past 2 months: Apartment Lives with:: Significant Other Patient language and need for interpreter reviewed:: Yes        Need for Family Participation in Patient Care: No (Comment) Care giver support system in place?: No (comment)   Criminal Activity/Legal Involvement Pertinent to Current Situation/Hospitalization: No - Comment as needed  Activities of Daily Living      Permission Sought/Granted      Share Information with NAME: Celena Forrest     Permission granted to share info w Relationship: girlfriend  Permission granted to share info w Contact Information: [phone]  Emotional Assessment Appearance:: Appears stated age Attitude/Demeanor/Rapport: Gracious Affect (typically observed): Accepting Orientation: : Oriented to Place, Oriented to  Time, Oriented to Situation, Oriented to Self   Psych Involvement: No (comment)  Admission diagnosis:  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] Acute CVA (cerebrovascular accident) [facility]) [I63.9] Cerebrovascular accident (CVA) due to thrombosis of basilar artery (HCC) [I63.02] Patient Active Problem List    Diagnosis Date Noted   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   New onset of congestive heart failure (HCC) 07/11/2021   Shortness of breath 07/11/2021   Low HDL (under 40) 09/18/2017   Chalazion of right eye 09/18/2017   Excessive sweating, local 09/18/2017   Environmental allergies 08/11/2016   History of kidney stones 08/11/2016   Family history of premature coronary heart disease 08/11/2016   PCP:  Oneita Hurt, No Pharmacy:   [id number] [place], [place] - [address] AT [facility] [address] [place] [address] Phone: [phone] Fax: [phone]  [facility] [address] [place] [address] Phone: [phone] Fax: [phone]     Social Determinants of Health (SDOH) Interventions    Readmission Risk Interventions No flowsheet data found.

## 2021-07-26 NOTE — Progress Notes (Addendum)
STROKE TEAM PROGRESS NOTE   SUBJECTIVE (INTERVAL HISTORY) Patient is seen in his room with his girlfriend at the bedside.  He has been hemodynamically stable overnight, and his hemoglobin is stable this morning at 12.9.  His left groin hematoma is soft with no evidence of expansion or continued bleeding.  Will arrange for gentle mobilization with PT/OT.  His neurological exam remains stable this morning.  Repeat hematocrit is stable at 40.  OBJECTIVE Vitals:   07/26/21 0700 07/26/21 0800 07/26/21 0900 07/26/21 1000  BP: (!) 98/57 96/66 106/72 117/79  Pulse: 60 60 65 75  Resp: 14 (!) 23 11 16   Temp:  98.3 F (36.8 C)    TempSrc:  Oral    SpO2: 97% 92% 99% (!) 88%  Weight:      Height:        CBC:  Recent Labs  Lab 07/24/21 0621 07/25/21 0928 07/26/21 0309  WBC 9.8 7.9 7.0  NEUTROABS 7.3 4.8  --   HGB 14.3 12.9* 12.9*  HCT 42.1 40.0 40.2  MCV 87.9 91.3 91.4  PLT 282 243 243     Basic Metabolic Panel:  Recent Labs  Lab 07/25/21 0928 07/26/21 0309  NA 136 138  K 3.9 3.9  CL 108 109  CO2 19* 21*  GLUCOSE 80 83  BUN 10 11  CREATININE 1.04 1.12  CALCIUM 8.9 8.8*  MG  --  1.8  PHOS  --  3.3     Lipid Panel:  Recent Labs  Lab 07/24/21 0621  CHOL 159  TRIG 50  HDL 35*  CHOLHDL 4.5  VLDL 10  LDLCALC 114*    HgbA1c:  Lab Results  Component Value Date   HGBA1C 4.9 07/24/2021   Urine Drug Screen:     Component Value Date/Time   LABOPIA NONE DETECTED 07/24/2021 0551   COCAINSCRNUR NONE DETECTED 07/24/2021 0551   LABBENZ NONE DETECTED 07/24/2021 0551   AMPHETMU NONE DETECTED 07/24/2021 0551   THCU POSITIVE (A) 07/24/2021 0551   LABBARB NONE DETECTED 07/24/2021 0551    Alcohol Level     Component Value Date/Time   ETH <10 07/24/2021 0010    IMAGING  Results for orders placed or performed during the hospital encounter of 07/23/21  CT HEAD WO CONTRAST (5MM)   Narrative   CLINICAL DATA:  Cerebral hemorrhage suspected  EXAM: CT HEAD WITHOUT  CONTRAST  TECHNIQUE: Contiguous axial images were obtained from the base of the skull through the vertex without intravenous contrast.  COMPARISON:  CT head 07/24/2021.  FINDINGS: Brain: No acute hemorrhage. No definite acute infarct. No mass, mass effect, or midline shift. No hydrocephalus or extra-axial fluid collection.  Vascular: Contrast from recent CTA is noted within the vasculature. Previously noted hyperdense vessel is less apparent on this exam.  Skull: Normal. Negative for fracture or focal lesion.  Sinuses/Orbits: No acute finding.  Other: The mastoid air cells are well aerated.  IMPRESSION: No acute hemorrhage.   Electronically Signed   By: Merilyn Baba M.D.   On: 07/24/2021 01:23     PHYSICAL EXAM  General:  Alert, well-developed male in no acute distress.  CV: Sinus rhythm with frequent PVCs seen on monitor.  Left groin hematoma is soft without evidence of expansion or continued bleeding.  Patient states that some of the soreness at this site has subsided.   NEURO:  Mental Status: AA&Ox3  Speech/Language: speech is without dysarthria or aphasia.  Naming, repetition, fluency, and comprehension intact.  Cranial Nerves:  II: PERRL.  III, IV, VI: EOMI. Eyelids elevate symmetrically.  V: Sensation is intact to light touch and symmetrical to face.  VII: Smile is symmetrical.  VIII: hearing intact to voice. IX, X: Phonation is normal.  XII: tongue is midline without fasciculations. Motor: 5/5 strength to all muscle groups tested.  Sensation- Intact to light touch bilaterally.   Coordination: FTN intact bilaterally, HKS: no ataxia in BLE.No drift.  Gait- deferred    ASSESSMENT/PLAN [name] is a 25 y.o. male with history of asthma, GERD, obesity with marked weight loss, and new onset acute CHF with an EF of <10-15% who presented with left facial numbness, dysarthria, and a dysconjugate gaze. He reports that his symptoms started around  2200 on 07/23/21. He called EMS but then decided to come to the hospital himself after speaking with his family. A code stroke was activated on arrival to the hospital. [facility] w/o contrast with no acute abnormality but concerning for a hyperdense basilar artery. 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  LKW: 07/23/2021 at 2200 TNK: Yes Thrombectomy: Initially decided to hold off on thrombectomy and take him for emergent intervention if his symptoms were to worsen. Symptoms worsened and ED activated code IR based on earlier discussion with patient. He did improve shortly afterwards with NIHSS of 3 again but given the concern for fluctuating symptoms, we felt it was better to take him to IR with hope to minimize ischemic any ischemic infarct. Once in IR, no stenosis, occlusions, filling defects or dissections seen. No intervention done . Initial NIH: 3  On 11/23, patient developed a left groin hematoma when attempting to mobilize with PT.  Groin ultrasound was performed showing only a superficial hematoma without evidence of pseudoaneurysm or AVF.  Hematoma is soft without evidence of continued bleeding today.  Will arrange gentle mobility with PT and restart Eliquis.  Will transfer patient out of the ICU.  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 CT Head 0115- no acute hemorrhagic transformation CTA Neck: -Focal occlusion of the distal basilar artery, which likely affects the origins of the bilateral superior cerebellar arteries. Flow is noted in the bilateral posterior cerebral arteries. MRI head 11/24- Acute infarcts in left frontal, temporal, and occipital lobes, as well as the right pons and right cerebellum. 2D Echo  EF less than 10-15%- dilated cardiomyopathy LDL 114 HgbA1c 4.9 SCDs for VTE  prophylaxis No antithrombotic prior to admission, restart Eliquis today as groin hematoma is stable Patient counseled to be compliant with his antithrombotic medications Ongoing aggressive stroke risk factor management Therapy recommendations:  pending Disposition:  pending  Hypotension Stable  No vasopressors needed overnight  Long-term BP goal normotensive  Hyperlipidemia Home meds:  None LDL 114, goal < 70 Add atorvastatin Continue statin at discharge  Diabetes type II HgbA1c 4.9, goal < 7.0 Controlled  Other Stroke Risk Factors Family hx stroke (Father) Coronary artery disease  Other Active Problems New onset congestive heart failure Lifevest in place Cardiology consulted- Patient is seen by Dr. Gala Romney on 11/222022 cMRI showed severe biventricular dysfunction w/ severe LV dilation and globally elevated ECV signal, w/ myocardial thinning and scar c/w a chronic process. LVEF 9%. RVEF 2% No LV thrombus.  PVCs- digoxin and mexiletine- home medications Bensimhon adding amio to help manage PVCs Left groin hematoma VAS  [place] Lower extremity- No evidence of pseudoaneurysm, AVF or DVT. There is hematoma noted.  H.H- 12.9/40.0- Repeat CBC ordered for later this afternoon Original site marked by RN Hematoma appears soft with non evidence of continued bleeding on 11/25 Will arrange gentle mobility with PT   Hospital day # Beatty , MSN, AGACNP-BC [facility] See Amion for schedule and pager information 07/26/2021 11:17 AM   I have personally obtained history,examined this patient, reviewed notes, independently viewed imaging studies, participated in medical decision making and plan of care.ROS completed by me personally and pertinent positives fully documented  I have made any additions or clarifications directly to the above note. Agree with note above.  Patient remains neurologically and hemodynamically stable.  Left groin hematoma is soft and  regressed.  Follow-up hematocrit is stable.  Recommend cautious mobilization out of bed today with physical therapy.  Start Eliquis and observe for 1 more day.  Transfer to neurology floor bed when available.  Hopefully t discharge home in the next couple of days if stable.  Long discussion with patient and girlfriend at the bedside and answered questions.This patient is critically ill and at significant risk of neurological worsening, death and care requires constant monitoring of vital signs, hemodynamics,respiratory and cardiac monitoring, extensive review of multiple databases, frequent neurological assessment, discussion with family, other specialists and medical decision making of high complexity.I have made any additions or clarifications directly to the above note.This critical care time does not reflect procedure time, or teaching time or supervisory time of PA/NP/Med Resident etc but could involve care discussion time.  I spent 30 minutes of neurocritical care time  in the care of  this patient.     Antony Contras, MD Medical Director [facility] Pager: [phone] 07/26/2021 12:14 PM   To contact Stroke Continuity provider, please refer to [email or web address] After hours, contact General Neurology

## 2021-07-27 DIAGNOSIS — I6302 Cerebral infarction due to thrombosis of basilar artery: Principal | ICD-10-CM

## 2021-07-27 DIAGNOSIS — I429 Cardiomyopathy, unspecified: Secondary | ICD-10-CM | POA: Diagnosis not present

## 2021-07-27 DIAGNOSIS — I651 Occlusion and stenosis of basilar artery: Secondary | ICD-10-CM | POA: Diagnosis not present

## 2021-07-27 DIAGNOSIS — I639 Cerebral infarction, unspecified: Secondary | ICD-10-CM | POA: Diagnosis not present

## 2021-07-27 DIAGNOSIS — I5022 Chronic systolic (congestive) heart failure: Secondary | ICD-10-CM

## 2021-07-27 DIAGNOSIS — I959 Hypotension, unspecified: Secondary | ICD-10-CM

## 2021-07-27 LAB — CBC
HCT: 42.8 % (ref 39.0–52.0)
Hemoglobin: 14 g/dL (ref 13.0–17.0)
MCH: 29 pg (ref 26.0–34.0)
MCHC: 32.7 g/dL (ref 30.0–36.0)
MCV: 88.8 fL (ref 80.0–100.0)
Platelets: 260 10*3/uL (ref 150–400)
RBC: 4.82 MIL/uL (ref 4.22–5.81)
RDW: 12.5 % (ref 11.5–15.5)
WBC: 8.3 10*3/uL (ref 4.0–10.5)
nRBC: 0 % (ref 0.0–0.2)

## 2021-07-27 LAB — BASIC METABOLIC PANEL
Anion gap: 10 (ref 5–15)
BUN: 10 mg/dL (ref 6–20)
CO2: 20 mmol/L — ABNORMAL LOW (ref 22–32)
Calcium: 9.1 mg/dL (ref 8.9–10.3)
Chloride: 108 mmol/L (ref 98–111)
Creatinine, Ser: 1.08 mg/dL (ref 0.61–1.24)
GFR, Estimated: >60 mL/min (ref >60)
Glucose, Bld: 86 mg/dL (ref 70–99)
Potassium: 3.5 mmol/L (ref 3.5–5.1)
Sodium: 138 mmol/L (ref 135–145)

## 2021-07-27 LAB — DIGOXIN LEVEL: Digoxin Level: 0.4 ng/mL — ABNORMAL LOW (ref 0.8–2.0)

## 2021-07-27 LAB — MAGNESIUM: Magnesium: 2.2 mg/dL (ref 1.7–2.4)

## 2021-07-27 LAB — PHOSPHORUS: Phosphorus: 3.4 mg/dL (ref 2.5–4.6)

## 2021-07-27 MED ORDER — SPIRONOLACTONE 12.5 MG HALF TABLET
12.5000 mg | ORAL_TABLET | Freq: Every day | ORAL | Status: DC
  Administered 2021-07-27 – 2021-07-28 (×2): 12.5 mg via ORAL
  Filled 2021-07-27 (×2): qty 1

## 2021-07-27 MED ORDER — CALCIUM CARBONATE ANTACID 500 MG PO CHEW
1.0000 | CHEWABLE_TABLET | Freq: Once | ORAL | Status: AC
  Administered 2021-07-27: 200 mg via ORAL
  Filled 2021-07-27: qty 1

## 2021-07-27 MED ORDER — POTASSIUM CHLORIDE CRYS ER 20 MEQ PO TBCR
40.0000 meq | EXTENDED_RELEASE_TABLET | Freq: Once | ORAL | Status: AC
  Administered 2021-07-27: 40 meq via ORAL
  Filled 2021-07-27: qty 2

## 2021-07-27 MED ORDER — CALCIUM CARBONATE ANTACID 500 MG PO CHEW
1.0000 | CHEWABLE_TABLET | Freq: Every day | ORAL | Status: DC
  Administered 2021-07-28: 11:00:00 200 mg via ORAL
  Filled 2021-07-27 (×2): qty 1

## 2021-07-27 NOTE — Progress Notes (Addendum)
STROKE TEAM PROGRESS NOTE   ATTENDING NOTE: I reviewed above note and agree with the assessment and Wood. Pt was seen and examined.   25 year old male with history of newly diagnosed CHF with low EF admitted for left facial numbness, slurred speech and disconjugate gaze.  CT no acute abnormality.  CTA head and neck showed basilar tip occlusion with bilateral SCA occlusion.  Cerebral angiogram showed no stenosis or thrombosis, therefore no intervention needed.  MRI showed right cerebellum, right pontine, left PCA scattered small infarcts.  EF <20%, no LV thrombus.  Prior cardiac MRI showed EF 9%.  LDL 114, A1c 4.9, UDS positive for THC.  Creatinine 1.12, AST/ALT 57/30.  Hemoglobin 12.9.  Patient had small hematoma at left groin after cerebral angiogram, however stable and ultrasound did not show pseudoaneurysm.  Currently groin soft with mild bruises but no tenderness or pulsatile nodule.  On exam, patient neurologically intact, no focal deficit.  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  Currently on Eliquis and Lipitor 40, continue on discharge.  BP on the low end likely due to severe cardiomyopathy.  Continue midodrine and avoid low BP.  CHF management per cardiology.  For detailed assessment and Wood, please refer to above as I have made changes wherever appropriate.   Neurology will sign off. Please call with questions. Pt will follow up with stroke clinic NP at [facility] in about 4 weeks. Thanks for the consult.   Thomas Plan, MD PhD Stroke Neurology 07/27/2021 11:56 AM    SUBJECTIVE (INTERVAL HISTORY) Patient is seen in his room with his girlfriend at the bedside.  He has been hemodynamically stable overnight and has had no acute events. Neurological exam remains stable. He has been able to ambulate around the room without difficulties. He is eager to go home.  OBJECTIVE Vitals:   07/26/21 2200 07/27/21 0016 07/27/21 0408  07/27/21 0800  BP:  109/78 94/61 106/83  Pulse:   82 76  Resp: 15 16 16 16   Temp:  98.1 F (36.7 C) 98.5 F (36.9 C) 98.1 F (36.7 C)  TempSrc:  Oral Oral Oral  SpO2:  100% 100% 99%  Weight:      Height:        CBC:  Recent Labs  Lab 07/24/21 0621 07/25/21 0928 07/26/21 0309 07/27/21 0236  WBC 9.8 7.9 7.0 8.3  NEUTROABS 7.3 4.8  --   --   HGB 14.3 12.9* 12.9* 14.0  HCT 42.1 40.0 40.2 42.8  MCV 87.9 91.3 91.4 88.8  PLT 282 243 243 260     Basic Metabolic Panel:  Recent Labs  Lab 07/26/21 0309 07/27/21 0236  NA 138 138  K 3.9 3.5  CL 109 108  CO2 21* 20*  GLUCOSE 83 86  BUN 11 10  CREATININE 1.12 1.08  CALCIUM 8.8* 9.1  MG 1.8 2.2  PHOS 3.3 3.4     Lipid Panel:  Recent Labs  Lab 07/24/21 0621  CHOL 159  TRIG 50  HDL 35*  CHOLHDL 4.5  VLDL 10  LDLCALC 07/26/21*    HgbA1c:  Lab Results  Component Value Date   HGBA1C 4.9 07/24/2021   Urine Drug Screen:     Component Value Date/Time   LABOPIA NONE DETECTED 07/24/2021 0551   COCAINSCRNUR NONE DETECTED 07/24/2021 0551   LABBENZ NONE DETECTED 07/24/2021 0551   AMPHETMU NONE DETECTED 07/24/2021 0551   THCU POSITIVE (A) 07/24/2021 0551   LABBARB NONE  DETECTED 07/24/2021 0551    Alcohol Level     Component Value Date/Time   ETH <10 07/24/2021 0010    IMAGING  Results for orders placed or performed during the hospital encounter of 07/23/21  CT HEAD WO CONTRAST (5MM)   Narrative   CLINICAL DATA:  Cerebral hemorrhage suspected  EXAM: CT HEAD WITHOUT CONTRAST  TECHNIQUE: Contiguous axial images were obtained from the base of the skull through the vertex without intravenous contrast.  COMPARISON:  CT head 07/24/2021.  FINDINGS: Brain: No acute hemorrhage. No definite acute infarct. No mass, mass effect, or midline shift. No hydrocephalus or extra-axial fluid collection.  Vascular: Contrast from recent CTA is noted within the vasculature. Previously noted hyperdense vessel is less  apparent on this exam.  Skull: Normal. Negative for fracture or focal lesion.  Sinuses/Orbits: No acute finding.  Other: The mastoid air cells are well aerated.  IMPRESSION: No acute hemorrhage.   Electronically Signed   By: Merilyn Baba M.D.   On: 07/24/2021 01:23     PHYSICAL EXAM  General:  Alert, well-developed male in no acute distress.  CV: Left groin hematoma remains soft without evidence of expansion or continued bleeding.     NEURO:  Mental Status: AA&Ox3  Speech/Language: speech is without dysarthria or aphasia.    Cranial Nerves:  II: PERRL.  III, IV, VI: EOMI. Eyelids elevate symmetrically.  V: Sensation is intact to light touch and symmetrical to face.  VII: Smile is symmetrical.  VIII: hearing intact to voice. IX, X: Phonation is normal.  XII: tongue is midline without fasciculations. Motor: 5/5 strength to all muscle groups tested.  Sensation- Intact to light touch bilaterally.   Coordination: FTN intact bilaterally, HKS: no ataxia in BLE.No drift.  Gait- deferred    ASSESSMENT/Wood [name] is a 25 y.o. male with history of asthma, GERD, obesity with marked weight loss, and new onset acute CHF with an EF of <10-15% who presented with left facial numbness, dysarthria, and a dysconjugate gaze. He reports that his symptoms started around 2200 on 07/23/21. He called EMS but then decided to come to the hospital himself after speaking with his family. A code stroke was activated on arrival to the hospital. [facility] w/o contrast with no acute abnormality but concerning for a hyperdense basilar artery. 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  LKW: 07/23/2021 at 2200 TNK: Yes Thrombectomy: Initially decided to hold off on thrombectomy and take him for emergent intervention if his symptoms were to worsen. Symptoms worsened and ED activated code IR based on earlier  discussion with patient. He did improve shortly afterwards with NIHSS of 3 again but given the concern for fluctuating symptoms, we felt it was better to take him to IR with hope to minimize ischemic any ischemic infarct. Once in IR, no stenosis, occlusions, filling defects or dissections seen. No intervention done . Initial NIH: 3  On 11/23, patient developed a left groin hematoma when attempting to mobilize with PT.  Groin ultrasound was performed showing only a superficial hematoma without evidence of pseudoaneurysm or AVF.  Hematoma is soft without evidence of continued bleeding today. Patient has been able to ambulate with PT without problems.  He has been transferred out of the ICU.  Neurology will sign off at this time but will remain available for any questions or concerns.  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 CT Head 0115- no acute hemorrhagic transformation CTA Neck: -Focal occlusion of the distal basilar artery, which likely affects the origins of the bilateral superior cerebellar arteries. Flow is noted in the bilateral posterior cerebral arteries. MRI head 11/24- Acute infarcts in left frontal, temporal, and occipital lobes, as well as the right pons and right cerebellum. 2D Echo  EF < 20%- dilated cardiomyopathy LDL 114 HgbA1c 4.9 SCDs for VTE prophylaxis No antithrombotic prior to admission, restart Eliquis today as groin hematoma is stable Patient counseled to be compliant with his antithrombotic medications Ongoing aggressive stroke risk factor management Therapy recommendations:  No PT follow up Disposition:  pending  Newly diagnosed Congestive heart failure Lifevest in place Cardiology consulted- Patient is seen by Dr. Haroldine Laws on 07/23/2021 cMRI showed severe biventricular dysfunction w/  severe LV dilation and globally elevated ECV signal, w/ myocardial thinning and scar c/w a chronic process. LVEF 9%. RVEF 2% No LV thrombus.  PVCs- digoxin and mexiletine- home medications Dr. Haroldine Laws adding amio to help manage PVCs  Hypotension Stable  No vasopressors needed overnight  Long-term BP goal normotensive  Hyperlipidemia Home meds:  None LDL 114, goal < 70 Add atorvastatin Continue statin at discharge  Other Stroke Risk Factors Family hx stroke (Father) Coronary artery disease  Other Active Problems Left groin hematoma VAS [place] Lower extremity- No evidence of pseudoaneurysm, AVF or DVT. There is hematoma noted.  H.H- 12.9/40.0- Repeat CBC ordered for later this afternoon Original site marked by RN Hematoma appears soft with non evidence of continued bleeding on 11/25 Will arrange gentle mobility with PT   Hospital day # [place] , MSN, AGACNP-BC [facility] See Amion for schedule and pager information 07/27/2021 11:24 AM   To contact Stroke Continuity provider, please refer to [email or web address] After hours, contact General Neurology

## 2021-07-27 NOTE — Progress Notes (Signed)
Patient ID: Thomas Wood, male   DOB: Oct 26, 1995, 25 y.o.   MRN: [id number]    Advanced Heart Failure Rounding Note   Subjective:    No complaints this morning.  No dyspnea.    Short NSVT run this morning, still with PVCs.   Objective:   Weight Range:  Vital Signs:   Temp:  [98.1 F (36.7 C)-98.5 F (36.9 C)] 98.1 F (36.7 C) (11/26 0800) Pulse Rate:  [67-94] 76 (11/26 0800) Resp:  [13-25] 16 (11/26 0800) BP: (92-120)/(61-91) 106/83 (11/26 0800) SpO2:  [96 %-100 %] 99 % (11/26 0800) Last BM Date: 07/26/21  Weight change: Filed Weights   07/23/21 2325  Weight: 80.3 kg    Intake/Output:   Intake/Output Summary (Last 24 hours) at 07/27/2021 1104 Last data filed at 07/26/2021 1700 Gross per 24 hour  Intake 220.12 ml  Output --  Net 220.12 ml     Physical Exam: General: NAD Neck: No JVD, no thyromegaly or thyroid nodule.  Lungs: Clear to auscultation bilaterally with normal respiratory effort. CV: Nondisplaced PMI.  Heart regular S1/S2, no S3/S4, no murmur.  No peripheral edema.   Abdomen: Soft, nontender, no hepatosplenomegaly, no distention.  Skin: Intact without lesions or rashes.  Neurologic: Alert and oriented x 3.  Psych: Normal affect. Extremities: No clubbing or cyanosis.  HEENT: Normal.   Telemetry: NSR 80s, 7 beat NSVT this morning, PVCs Personally reviewed   Labs: Basic Metabolic Panel: Recent Labs  Lab 07/24/21 0010 07/24/21 0021 07/24/21 0621 07/25/21 0928 07/26/21 0309 07/27/21 0236  NA 134* 137 137 136 138 138  K 4.0 3.9 3.8 3.9 3.9 3.5  CL 103 104 107 108 109 108  CO2 19*  --  21* 19* 21* 20*  GLUCOSE 176* 171* 93 80 83 86  BUN 25* 26* 17 10 11 10   CREATININE 1.37* 1.30* 1.09 1.04 1.12 1.08  CALCIUM 9.8  --  9.1 8.9 8.8* 9.1  MG  --   --   --   --  1.8 2.2  PHOS  --   --   --   --  3.3 3.4    Liver Function Tests: Recent Labs  Lab 07/23/21 1629 07/24/21 0010 07/25/21 0928  AST 31 31 57*  ALT 43 42 30  ALKPHOS 62 52  48  BILITOT 1.2 1.3* 2.2*  PROT 7.6 7.3 6.3*  ALBUMIN 4.3 4.2 3.4*   No results for input(s): LIPASE, AMYLASE in the last 168 hours. No results for input(s): AMMONIA in the last 168 hours.  CBC: Recent Labs  Lab 07/24/21 0010 07/24/21 0021 07/24/21 0621 07/25/21 0928 07/26/21 0309 07/27/21 0236  WBC 13.4*  --  9.8 7.9 7.0 8.3  NEUTROABS 12.0*  --  7.3 4.8  --   --   HGB 15.3 16.7 14.3 12.9* 12.9* 14.0  HCT 44.9 49.0 42.1 40.0 40.2 42.8  MCV 88.4  --  87.9 91.3 91.4 88.8  PLT 289  --  282 243 243 260    Cardiac Enzymes: No results for input(s): CKTOTAL, CKMB, CKMBINDEX, TROPONINI in the last 168 hours.  BNP: BNP (last 3 results) Recent Labs    07/11/21 1401 07/23/21 1629  BNP 1,884.0* 716.6*    ProBNP (last 3 results) No results for input(s): PROBNP in the last 8760 hours.    Other results:  Imaging: No results found.   Medications:     Scheduled Medications:   stroke: mapping our early stages of recovery book   Does not  apply Once   amiodarone  400 mg Oral BID   apixaban  5 mg Oral BID   atorvastatin  40 mg Oral Daily   calcium carbonate  1 tablet Oral Daily   digoxin  0.125 mg Oral Daily   empagliflozin  10 mg Oral Daily   mexiletine  300 mg Oral Q12H   pantoprazole (PROTONIX) IV  40 mg Intravenous QHS   spironolactone  12.5 mg Oral Daily    Infusions:  sodium chloride Stopped (07/26/21 1341)    PRN Medications: acetaminophen **OR** acetaminophen (TYLENOL) oral liquid 160 mg/5 mL **OR** acetaminophen, senna-docusate, traMADol   Assessment/Plan:    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  - Resolved with TNK  No LVO on invasive angio - Numbness resolved - Eliquis started - Continue statin  - Appreciate Neuro care   2. HFrEF with Biventricular HF  Echo 11/22 EF <20%, RV moderately reduced, global HK (no prior study for comparison) - Cath 11/22 Normal cors with distal LAD bridge. Cath complicated by  severe vagal episode with near loss of pulse. - cMRI - severe biventricular dysfunciton myocardial thinning and scar. LVEF 9% RVEF 2% (yes 2%) - Concern for familial CM. Possible contribution from frequent PVCs, Father had CM in 70s and had LVAD as bridge to eventual transplant  - Blood type O+  - Volume status stable.  - Continue digoxin and jardiance. - Stop midodrine, start spironolactone 12.5 mg daily.    3. PVCs  - Has been on mexiletine.  - Amio started 11/24. Will continue - Keep K > 4.0 Mg > 2.0. Will supp  - Continue lifevest - NSVT this morning.  If he can go 24 hrs without further NSVT, will send home tomorrow.    4. L groin Hematoma  - site looks good now - u/s without PSA   Length of Stay: 3   Marca Ancona MD 07/27/2021, 11:04 AM  Advanced Heart Failure Team Pager [phone] (M-F; 7a - 4p)  Please contact [facility] for night-coverage after hours (4p -7a ) and weekends on [email or web address]

## 2021-07-27 NOTE — Progress Notes (Signed)
Physical Therapy Treatment Patient Details Name: Thomas Wood MRN: [id number] DOB: 1995-12-25 Today's Date: 07/27/2021   History of Present Illness 25 y.o. male admitted with left facial numbness, dysarthric speech & dysconjugate gaze. 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 +tNKase. Symptoms worsened and pt underwent thrombectomy. PMH significant for asthma, GERD, obesity s/p marked weight loss, new onset acute CHF with EF of 10-15%    PT Comments    Patient received in bed, pleasant and cooperative today. Able to mobilize on an independent basis with no device. Prior to session, I got permission from attending MD (Dr. Rito Ehrlich) to try steps and [name] was able to perform them with no difficulty with L rail, which will be available to him at home. He tells me that he really is at his baseline level of function. I think he is OK to walk the unit on an independent basis and we discussed this too. Left sitting at EOB with significant other present and all needs otherwise met. Signing off as he really is independent with all mobility tasks and does not need skilled PT services- thank you for the opportunity to participate in his care!    Recommendations for follow up therapy are one component of a multi-disciplinary discharge planning process, led by the attending physician.  Recommendations may be updated based on patient status, additional functional criteria and insurance authorization.  Follow Up Recommendations  No PT follow up     Assistance Recommended at Discharge PRN  Equipment Recommendations  None recommended by PT    Recommendations for Other Services       Precautions / Restrictions Precautions Precautions: Other (comment) Precaution Comments: L groin hematoma Restrictions Weight Bearing Restrictions: No     Mobility  Bed Mobility Overal bed mobility: Independent                  Transfers Overall transfer level: Independent Equipment used: None Transfers: Sit  to/from Stand Sit to Stand: Independent                Ambulation/Gait Ambulation/Gait assistance: Independent Gait Distance (Feet): 250 Feet Assistive device: None Gait Pattern/deviations: Step-to pattern;WFL(Within Functional Limits) Gait velocity: decreased     General Gait Details: gait pattern WNL, no LOB or unsteadiness noted   Stairs Stairs: Yes Stairs assistance: Modified independent (Device/Increase time) Stair Management: One rail Left;Alternating pattern Number of Stairs: 6 General stair comments: no difficulty with step over step pattern, no groin pain   Wheelchair Mobility    Modified Rankin (Stroke Patients Only)       Balance Overall balance assessment: No apparent balance deficits (not formally assessed)                                          Cognition Arousal/Alertness: Awake/alert Behavior During Therapy: WFL for tasks assessed/performed Overall Cognitive Status: Within Functional Limits for tasks assessed                                          Exercises      General Comments General comments (skin integrity, edema, etc.): HR 111BPM with activity, recovered well with rest      Pertinent Vitals/Pain Pain Assessment: No/denies pain Pain Score: 0-No pain Pain Intervention(s): Limited activity within patient's tolerance;Monitored during session  Home Living                          Prior Function            PT Goals (current goals can now be found in the care plan section) Acute Rehab PT Goals Patient Stated Goal: to go home PT Goal Formulation: With patient Time For Goal Achievement: 08/02/21 Potential to Achieve Goals: Good Progress towards PT goals: Progressing toward goals    Frequency    Min 4X/week      PT Plan      Co-evaluation              AM-PAC PT "6 Clicks" Mobility   Outcome Measure  Help needed turning from your back to your side while in a flat  bed without using bedrails?: None Help needed moving from lying on your back to sitting on the side of a flat bed without using bedrails?: None Help needed moving to and from a bed to a chair (including a wheelchair)?: None Help needed standing up from a chair using your arms (e.g., wheelchair or bedside chair)?: None Help needed to walk in hospital room?: None Help needed climbing 3-5 steps with a railing? : None 6 Click Score: 24    End of Session   Activity Tolerance: Patient tolerated treatment well Patient left: in bed;with call bell/phone within reach;with family/visitor present Nurse Communication: Mobility status PT Visit Diagnosis: Muscle weakness (generalized) (M62.81)     Time: 9977-4142 PT Time Calculation (min) (ACUTE ONLY): 7 min  Charges:     No charge visit   Windell Norfolk, DPT, PN2   Supplemental Physical Therapist [place]    Pager [phone] Acute Rehab Office [phone]

## 2021-07-27 NOTE — Progress Notes (Signed)
[facility] PROGRESS NOTE   KYJUAN STOLLEY [id number] DOB: [birth year] DOA: 07/23/2021  PCP: Pcp, No  Brief History/Interval Summary: 25 y.o. male with history of asthma, GERD, obesity with marked weight loss, and new onset acute CHF with an EF of <10-15% who presented with left facial numbness, dysarthria, and a dysconjugate gaze. He reports that his symptoms started around 2200 on 07/23/21. He called EMS but then decided to come to the hospital himself after speaking with his family. A code stroke was activated on arrival to the hospital. [facility] w/o contrast with no acute abnormality but concerning for a hyperdense basilar artery. 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  Was given TNKase.  Was also taken for thrombectomy.  However angiogram did not show any stenosis occlusions or filling defects.  No intervention was performed.  Consultants: Neurology.  Interventional radiology  Procedures:  Transthoracic echocardiogram.   Cerebral angiogram with intent to do thrombectomy which was not necessary in the a.m.    Subjective/Interval History: Patient denies any complaints.  His left-sided facial numbness and difficulty speaking have all resolved.  Denies any chest pain.  Occasional palpitations at present.    Assessment/Plan:  Acute stroke Involving basilar artery.  Was initially admitted to intensive care unit.  Received TNKase.  Cerebral angiogram was done but no occlusions were noted.  Did not require any intervention.   LDL 114.  HbA1c 4.9. Currently on atorvastatin.  Patient is now on Eliquis.  Left groin hematoma No evidence for pseudoaneurysm on ultrasound.  Stable this morning.  Chronic systolic CHF/PVCs EF noted to be less than 20%.  Heart failure team has been following.  LifeVest in place.   Patient on digoxin and mexiletine for his PVCs but still with significant burden.  Was  started on amiodarone. Spironolactone has been reinitiated.  Noted to be on Jardiance. Cardiac cath done on November 22 showed normal coronaries.  Cardiac cath was complicated by a severe vagal episode with near loss of pulse.  Hypotension Noted to be on midodrine.     DVT Prophylaxis: On Eliquis Code Status: Full code Family Communication: Discussed with patient Disposition Plan: Discharge when cleared by cardiology  Status is: Inpatient  Remains inpatient appropriate because: Acute stroke, PVCs, cardiomyopathy     Medications: Scheduled:   stroke: mapping our early stages of recovery book   Does not apply Once   amiodarone  400 mg Oral BID   apixaban  5 mg Oral BID   atorvastatin  40 mg Oral Daily   calcium carbonate  1 tablet Oral Daily   digoxin  0.125 mg Oral Daily   empagliflozin  10 mg Oral Daily   mexiletine  300 mg Oral Q12H   pantoprazole (PROTONIX) IV  40 mg Intravenous QHS   spironolactone  12.5 mg Oral Daily   Continuous:  sodium chloride Stopped (07/26/21 1341)   [id number] **OR** acetaminophen (TYLENOL) oral liquid 160 mg/5 mL **OR** acetaminophen, senna-docusate, traMADol  Antibiotics: Anti-infectives (From admission, onward)    Start     Dose/Rate Route Frequency Ordered Stop   07/24/21 0156  ceFAZolin (ANCEF) 2-4 GM/100ML-% IVPB       Note to Pharmacy: Novella Rob   : cabinet override      07/24/21 0156 07/24/21 1414       Objective:  Vital Signs  Vitals:   07/26/21 2200 07/27/21 0016 07/27/21 0408 07/27/21 0800  BP:  109/78 94/61 106/83  Pulse:   82 76  Resp: 15 16 16 16   Temp:  98.1 F (36.7 C) 98.5 F (36.9 C) 98.1 F (36.7 C)  TempSrc:  Oral Oral Oral  SpO2:  100% 100% 99%  Weight:      Height:        Intake/Output Summary (Last 24 hours) at 07/27/2021 1057 Last data filed at 07/26/2021 1700 Gross per 24 hour  Intake 220.12 ml  Output --  Net 220.12 ml   Filed Weights   07/23/21 2325  Weight: 80.3 kg     General appearance: Awake alert.  In no distress Resp: Clear to auscultation bilaterally.  Normal effort Cardio: S1-S2 is normal regular.  No S3-S4.  No rubs murmurs or bruit GI: Abdomen is soft.  Nontender nondistended.  Bowel sounds are present normal.  No masses organomegaly Extremities: No edema.  Full range of motion of lower extremities. Neurologic: Alert and oriented x3.  No focal neurological deficits.    Lab Results:  Data Reviewed: I have personally reviewed following labs and imaging studies  CBC: Recent Labs  Lab 07/24/21 0010 07/24/21 0021 07/24/21 0621 07/25/21 0928 07/26/21 0309 07/27/21 0236  WBC 13.4*  --  9.8 7.9 7.0 8.3  NEUTROABS 12.0*  --  7.3 4.8  --   --   HGB 15.3 16.7 14.3 12.9* 12.9* 14.0  HCT 44.9 49.0 42.1 40.0 40.2 42.8  MCV 88.4  --  87.9 91.3 91.4 88.8  PLT 289  --  282 243 243 [id number]    Basic Metabolic Panel: Recent Labs  Lab 07/24/21 0010 07/24/21 0021 07/24/21 0621 07/25/21 0928 07/26/21 0309 07/27/21 0236  NA 134* 137 137 136 138 138  K 4.0 3.9 3.8 3.9 3.9 3.5  CL 103 104 107 108 109 108  CO2 19*  --  21* 19* 21* 20*  GLUCOSE 176* 171* 93 80 83 86  BUN 25* 26* 17 10 11 10   CREATININE 1.37* 1.30* 1.09 1.04 1.12 1.08  CALCIUM 9.8  --  9.1 8.9 8.8* 9.1  MG  --   --   --   --  1.8 2.2  PHOS  --   --   --   --  3.3 3.4    GFR: Estimated Creatinine Clearance: 112.3 mL/min (by C-G formula based on SCr of 1.08 mg/dL).  Liver Function Tests: Recent Labs  Lab 07/23/21 1629 07/24/21 0010 07/25/21 0928  AST 31 31 57*  ALT 43 42 30  ALKPHOS 62 52 48  BILITOT 1.2 1.3* 2.2*  PROT 7.6 7.3 6.3*  ALBUMIN 4.3 4.2 3.4*      Coagulation Profile: Recent Labs  Lab 07/24/21 0010 07/25/21 0928  INR 1.1 1.2     Recent Results (from the past 240 hour(s))  Resp Panel by RT-PCR (Flu A&B, Covid) Nasopharyngeal Swab     Status: None   Collection Time: 07/24/21 12:10 AM   Specimen: Nasopharyngeal Swab; Nasopharyngeal(NP) swabs in  vial transport medium  Result Value Ref Range Status   SARS Coronavirus 2 by RT PCR NEGATIVE NEGATIVE Final    Comment: (NOTE) SARS-CoV-2 target nucleic acids are NOT DETECTED.  The SARS-CoV-2 RNA is generally detectable in upper respiratory specimens during the acute phase of infection. The lowest concentration of SARS-CoV-2 viral copies this assay can detect is 138 copies/mL. A negative result does not preclude SARS-Cov-2 infection and should not be used as the sole basis for treatment or other patient management decisions. A negative  result may occur with  improper specimen collection/handling, submission of specimen other than nasopharyngeal swab, presence of viral mutation(s) within the areas targeted by this assay, and inadequate number of viral copies(<138 copies/mL). A negative result must be combined with clinical observations, patient history, and epidemiological information. The expected result is Negative.  Fact Sheet for Patients:  BloggerCourse.com  Fact Sheet for Healthcare Providers:  SeriousBroker.it  This test is no t yet approved or cleared by the [place] FDA and  has been authorized for detection and/or diagnosis of SARS-CoV-2 by FDA under an Emergency Use Authorization (EUA). This EUA will remain  in effect (meaning this test can be used) for the duration of the COVID-19 declaration under Section 564(b)(1) of the Act, 21 U.S.C.section 360bbb-3(b)(1), unless the authorization is terminated  or revoked sooner.       Influenza A by PCR NEGATIVE NEGATIVE Final   Influenza B by PCR NEGATIVE NEGATIVE Final    Comment: (NOTE) The Xpert Xpress SARS-CoV-2/FLU/RSV plus assay is intended as an aid in the diagnosis of influenza from Nasopharyngeal swab specimens and should not be used as a sole basis for treatment. Nasal washings and aspirates are unacceptable for Xpert Xpress SARS-CoV-2/FLU/RSV testing.  Fact  Sheet for Patients: BloggerCourse.com  Fact Sheet for Healthcare Providers: SeriousBroker.it  This test is not yet approved or cleared by the [place] FDA and has been authorized for detection and/or diagnosis of SARS-CoV-2 by FDA under an Emergency Use Authorization (EUA). This EUA will remain in effect (meaning this test can be used) for the duration of the COVID-19 declaration under Section 564(b)(1) of the Act, 21 U.S.C. section 360bbb-3(b)(1), unless the authorization is terminated or revoked.  Performed at [facility], [address], [place], [place] [address]   MRSA Next Gen by PCR, Nasal     Status: None   Collection Time: 07/24/21  3:11 AM   Specimen: Nasal Mucosa; Nasal Swab  Result Value Ref Range Status   MRSA by PCR Next Gen NOT DETECTED NOT DETECTED Final    Comment: (NOTE) The GeneXpert MRSA Assay (FDA approved for NASAL specimens only), is one component of a comprehensive MRSA colonization surveillance program. It is not intended to diagnose MRSA infection nor to guide or monitor treatment for MRSA infections. Test performance is not FDA approved in patients less than 72 years old. Performed at [facility], [address], [place], [place] [address]       Radiology Studies: No results found.     LOS: 3 days   Breckyn Ticas [employer] on [email or web address]  07/27/2021, 10:57 AM

## 2021-07-28 ENCOUNTER — Encounter (HOSPITAL_COMMUNITY): Payer: Self-pay | Admitting: Internal Medicine

## 2021-07-28 DIAGNOSIS — I639 Cerebral infarction, unspecified: Secondary | ICD-10-CM | POA: Diagnosis not present

## 2021-07-28 DIAGNOSIS — I651 Occlusion and stenosis of basilar artery: Secondary | ICD-10-CM | POA: Diagnosis not present

## 2021-07-28 LAB — CBC
HCT: 42.2 % (ref 39.0–52.0)
Hemoglobin: 14 g/dL (ref 13.0–17.0)
MCH: 29.4 pg (ref 26.0–34.0)
MCHC: 33.2 g/dL (ref 30.0–36.0)
MCV: 88.7 fL (ref 80.0–100.0)
Platelets: 300 10*3/uL (ref 150–400)
RBC: 4.76 MIL/uL (ref 4.22–5.81)
RDW: 12.5 % (ref 11.5–15.5)
WBC: 8.2 10*3/uL (ref 4.0–10.5)
nRBC: 0 % (ref 0.0–0.2)

## 2021-07-28 LAB — BASIC METABOLIC PANEL
Anion gap: 6 (ref 5–15)
BUN: 14 mg/dL (ref 6–20)
CO2: 25 mmol/L (ref 22–32)
Calcium: 9 mg/dL (ref 8.9–10.3)
Chloride: 108 mmol/L (ref 98–111)
Creatinine, Ser: 1.24 mg/dL (ref 0.61–1.24)
GFR, Estimated: >60 mL/min (ref >60)
Glucose, Bld: 123 mg/dL — ABNORMAL HIGH (ref 70–99)
Potassium: 4.1 mmol/L (ref 3.5–5.1)
Sodium: 139 mmol/L (ref 135–145)

## 2021-07-28 LAB — GLUCOSE, CAPILLARY: Glucose-Capillary: 71 mg/dL (ref 70–99)

## 2021-07-28 LAB — MAGNESIUM: Magnesium: 2 mg/dL (ref 1.7–2.4)

## 2021-07-28 LAB — PHOSPHORUS: Phosphorus: 3.7 mg/dL (ref 2.5–4.6)

## 2021-07-28 MED ORDER — ATORVASTATIN CALCIUM 40 MG PO TABS: 40.0000 mg | ORAL_TABLET | Freq: Every day | ORAL | 1 refills | Status: DC

## 2021-07-28 MED ORDER — AMIODARONE HCL 200 MG PO TABS: ORAL_TABLET | ORAL | 1 refills | Status: DC

## 2021-07-28 MED ORDER — MIDODRINE HCL 2.5 MG PO TABS: 2.5000 mg | ORAL_TABLET | Freq: Two times a day (BID) | ORAL | 1 refills | Status: DC

## 2021-07-28 MED ORDER — APIXABAN 5 MG PO TABS: 5.0000 mg | ORAL_TABLET | Freq: Two times a day (BID) | ORAL | 1 refills | Status: DC

## 2021-07-28 MED ORDER — MEXILETINE HCL 150 MG PO CAPS: 300.0000 mg | ORAL_CAPSULE | Freq: Two times a day (BID) | ORAL | 1 refills | Status: DC

## 2021-07-28 MED ORDER — SPIRONOLACTONE 25 MG PO TABS: 12.5000 mg | ORAL_TABLET | Freq: Every day | ORAL | 1 refills | Status: DC

## 2021-07-28 NOTE — Progress Notes (Signed)
Patient medically stable to discharge per MD order. IV and tele removed. Discharge paperwork reviewed with patient and family, all questions answered. Belongings: cell phone, charger, clothes, wallet. Patient leaving for home.

## 2021-07-28 NOTE — TOC Transition Note (Signed)
Transition of Care [facility]) - CM/SW Discharge Note   Patient Details  Name: Thomas Wood MRN: [id number] Date of Birth: Jun 14, 1996  [facility] of [facility]) CM/SW Contact:  Bess Kinds, RN Phone Number: [phone] 07/28/2021, 11:42 AM   Clinical Narrative:     Patient to transition home today. Provided Eliquis savings cards for free trial and $10 copay. No further TOC needs identified at this time.   Final next level of care: Home/Self Care Barriers to Discharge: No Barriers Identified   Patient Goals and CMS Choice        Discharge Placement                       Discharge Plan and Services In-house Referral: Clinical Social Work Discharge Planning Services: CM Consult                                 Social Determinants of Health (SDOH) Interventions     Readmission Risk Interventions No flowsheet data found.

## 2021-07-28 NOTE — Plan of Care (Signed)
°  Problem: Education: °Goal: Knowledge of disease or condition will improve °Outcome: Progressing °Goal: Knowledge of secondary prevention will improve (SELECT ALL) °Outcome: Progressing °Goal: Knowledge of patient specific risk factors will improve (INDIVIDUALIZE FOR PATIENT) °Outcome: Progressing °Goal: Individualized Educational Video(s) °Outcome: Progressing °  °Problem: Coping: °Goal: Will verbalize positive feelings about self °Outcome: Progressing °Goal: Will identify appropriate support needs °Outcome: Progressing °  °Problem: Health Behavior/Discharge Planning: °Goal: Ability to manage health-related needs will improve °Outcome: Progressing °  °Problem: Self-Care: °Goal: Ability to participate in self-care as condition permits will improve °Outcome: Progressing °Goal: Verbalization of feelings and concerns over difficulty with self-care will improve °Outcome: Progressing °Goal: Ability to communicate needs accurately will improve °Outcome: Progressing °  °Problem: Nutrition: °Goal: Risk of aspiration will decrease °Outcome: Progressing °Goal: Dietary intake will improve °Outcome: Progressing °  °Problem: Ischemic Stroke/TIA Tissue Perfusion: °Goal: Complications of ischemic stroke/TIA will be minimized °Outcome: Progressing °  °Problem: Education: °Goal: Knowledge of General Education information will improve °Description: Including pain rating scale, medication(s)/side effects and non-pharmacologic comfort measures °Outcome: Progressing °  °Problem: Health Behavior/Discharge Planning: °Goal: Ability to manage health-related needs will improve °Outcome: Progressing °  °Problem: Clinical Measurements: °Goal: Ability to maintain clinical measurements within normal limits will improve °Outcome: Progressing °Goal: Will remain free from infection °Outcome: Progressing °Goal: Diagnostic test results will improve °Outcome: Progressing °Goal: Respiratory complications will improve °Outcome: Progressing °Goal:  Cardiovascular complication will be avoided °Outcome: Progressing °  °Problem: Activity: °Goal: Risk for activity intolerance will decrease °Outcome: Progressing °  °Problem: Nutrition: °Goal: Adequate nutrition will be maintained °Outcome: Progressing °  °Problem: Coping: °Goal: Level of anxiety will decrease °Outcome: Progressing °  °Problem: Elimination: °Goal: Will not experience complications related to bowel motility °Outcome: Progressing °Goal: Will not experience complications related to urinary retention °Outcome: Progressing °  °Problem: Pain Managment: °Goal: General experience of comfort will improve °Outcome: Progressing °  °Problem: Safety: °Goal: Ability to remain free from injury will improve °Outcome: Progressing °  °Problem: Skin Integrity: °Goal: Risk for impaired skin integrity will decrease °Outcome: Progressing °  °

## 2021-07-28 NOTE — Progress Notes (Addendum)
Patient ID: Thomas Wood, male   DOB: September 21, 1995, 25 y.o.   MRN: [id number]    Advanced Heart Failure Rounding Note   Subjective:    No complaints this morning.  No dyspnea.    Still with PVCs, 1 run NSVT during the day yesterday.    Objective:   Weight Range:  Vital Signs:   Temp:  [97.9 F (36.6 C)-98.5 F (36.9 C)] 98.2 F (36.8 C) (11/27 0720) Pulse Rate:  [68-94] 68 (11/27 0720) Resp:  [16-18] 18 (11/27 0720) BP: (87-116)/(65-81) 92/70 (11/27 0720) SpO2:  [99 %-100 %] 100 % (11/27 0720) Last BM Date: 07/26/21  Weight change: Filed Weights   07/23/21 2325  Weight: 80.3 kg    Intake/Output:  No intake or output data in the 24 hours ending 07/28/21 1014    Physical Exam: General: NAD Neck: No JVD, no thyromegaly or thyroid nodule.  Lungs: Clear to auscultation bilaterally with normal respiratory effort. CV: Nondisplaced PMI.  Heart regular S1/S2, no S3/S4, no murmur.  No peripheral edema.   Abdomen: Soft, nontender, no hepatosplenomegaly, no distention.  Skin: Intact without lesions or rashes.  Neurologic: Alert and oriented x 3.  Psych: Normal affect. Extremities: No clubbing or cyanosis.  HEENT: Normal.    Telemetry: NSR 80s, No NSVT since yesterday Personally reviewed   Labs: Basic Metabolic Panel: Recent Labs  Lab 07/24/21 0621 07/25/21 0928 07/26/21 0309 07/27/21 0236 07/28/21 0208  NA 137 136 138 138 139  K 3.8 3.9 3.9 3.5 4.1  CL 107 108 109 108 108  CO2 21* 19* 21* 20* 25  GLUCOSE 93 80 83 86 123*  BUN 17 10 11 10 14   CREATININE 1.09 1.04 1.12 1.08 1.24  CALCIUM 9.1 8.9 8.8* 9.1 9.0  MG  --   --  1.8 2.2 2.0  PHOS  --   --  3.3 3.4 3.7    Liver Function Tests: Recent Labs  Lab 07/23/21 1629 07/24/21 0010 07/25/21 0928  AST 31 31 57*  ALT 43 42 30  ALKPHOS 62 52 48  BILITOT 1.2 1.3* 2.2*  PROT 7.6 7.3 6.3*  ALBUMIN 4.3 4.2 3.4*   No results for input(s): LIPASE, AMYLASE in the last 168 hours. No results for input(s):  AMMONIA in the last 168 hours.  CBC: Recent Labs  Lab 07/24/21 0010 07/24/21 0021 07/24/21 [id number] 07/25/21 0928 07/26/21 0309 07/27/21 0236 07/28/21 0208  WBC 13.4*  --  9.8 7.9 7.0 8.3 8.2  NEUTROABS 12.0*  --  7.3 4.8  --   --   --   HGB 15.3   < > 14.3 12.9* 12.9* 14.0 14.0  HCT 44.9   < > 42.1 40.0 40.2 42.8 42.2  MCV 88.4  --  87.9 91.3 91.4 88.8 88.7  PLT 289  --  282 243 243 260 300   < > = values in this interval not displayed.    Cardiac Enzymes: No results for input(s): CKTOTAL, CKMB, CKMBINDEX, TROPONINI in the last 168 hours.  BNP: BNP (last 3 results) Recent Labs    07/11/21 1401 07/23/21 1629  BNP 1,884.0* 716.6*    ProBNP (last 3 results) No results for input(s): PROBNP in the last 8760 hours.    Other results:  Imaging: No results found.   Medications:     Scheduled Medications:   stroke: mapping our early stages of recovery book   Does not apply Once   amiodarone  400 mg Oral BID   apixaban  5 mg Oral BID   atorvastatin  40 mg Oral Daily   calcium carbonate  1 tablet Oral Daily   digoxin  0.125 mg Oral Daily   empagliflozin  10 mg Oral Daily   mexiletine  300 mg Oral Q12H   pantoprazole (PROTONIX) IV  40 mg Intravenous QHS   spironolactone  12.5 mg Oral Daily    Infusions:  sodium chloride Stopped (07/26/21 1341)    PRN Medications: acetaminophen **OR** acetaminophen (TYLENOL) oral liquid 160 mg/5 mL **OR** acetaminophen, senna-docusate, traMADol   Assessment/Plan:    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  - Resolved with TNK  No LVO on invasive angio - Numbness resolved - Eliquis started - Continue statin  - Appreciate Neuro care   2. HFrEF with Biventricular HF  Echo 11/22 EF <20%, RV moderately reduced, global HK (no prior study for comparison) - Cath 11/22 Normal cors with distal LAD bridge. Cath complicated by severe vagal episode with near loss of pulse. - cMRI - severe  biventricular dysfunciton myocardial thinning and scar. LVEF 9% RVEF 2% (yes 2%) - Concern for familial CM. Possible contribution from frequent PVCs, Father had CM in 41s and had LVAD as bridge to eventual transplant  - Blood type O+  - Volume status stable.  - Continue digoxin and jardiance. - Continue spironolactone 12.5 mg daily.  - No BP room to increase meds.    3. PVCs  - Has been on mexiletine.  - Amio started 11/24. Will continue - Keep K > 4.0 Mg > 2.0. Will supp  - Continue lifevest - No NSVT since yesterday.    4. L groin Hematoma  - site looks good now - u/s without PSA  OK for home today with Lifevest.  Followup CHF clinic.  Meds for home: digoxin 0.125, spironolactone 12.5, Jardiance 10 , amiodarone 400 mg bid x 7 days then 200 mg bid x 7 days then 200 mg daily, Eliquis 5 mg bid, mexiletine 300 bid, atorvastatin 40 daily.    Length of Stay: 4   Marca Ancona MD 07/28/2021, 10:14 AM  Advanced Heart Failure Team Pager [phone] (M-F; 7a - 4p)  Please contact [facility] for night-coverage after hours (4p -7a ) and weekends on [email or web address]

## 2021-07-28 NOTE — Discharge Summary (Signed)
[facility]  Physician Discharge Summary   Patient ID: Thomas Wood MRN: [id number] DOB/AGE: [birth year] 25 y.o.  Admit date: 07/23/2021 Discharge date: 07/28/2021    PCP: Pcp, No  DISCHARGE DIAGNOSES:  Acute stroke Chronic systolic CHF PVCs Chronic hypotension   RECOMMENDATIONS FOR OUTPATIENT FOLLOW UP: Continue [facility] to arrange outpatient follow-up Ambulatory referral sent to neurology for follow-up    Home Health: None Equipment/Devices: [facility]  CODE STATUS: Full code  DISCHARGE CONDITION: fair  Diet recommendation: Heart healthy  INITIAL HISTORY: 25 y.o. male with history of asthma, GERD, obesity with marked weight loss, and new onset acute CHF with an EF of <10-15% who presented with left facial numbness, dysarthria, and a dysconjugate gaze. He reports that his symptoms started around 2200 on 07/23/21. He called EMS but then decided to come to the hospital himself after speaking with his family. A code stroke was activated on arrival to the hospital. [facility] w/o contrast with no acute abnormality but concerning for a hyperdense basilar artery. 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  Was given TNKase.  Was also taken for thrombectomy.  However angiogram did not show any stenosis occlusions or filling defects.  No intervention was performed.   Consultants: Neurology.  Interventional radiology   Procedures:  Transthoracic echocardiogram.   Cerebral angiogram with intent to do thrombectomy which was not necessary in the a.m.   HOSPITAL COURSE:   Acute stroke Involving basilar artery.  Was initially admitted to intensive care unit.  Received TNKase.  Cerebral angiogram was done but no occlusions were noted.  Did not require any intervention.   LDL 114.  HbA1c 4.9. Currently on atorvastatin.  Patient is now on Eliquis.   Left groin hematoma No evidence for  pseudoaneurysm on ultrasound.  Hemoglobin has been stable.   Chronic systolic CHF/PVCs EF noted to be less than 20%.  Heart failure team has been following.  LifeVest in place.   Patient on digoxin and mexiletine for his PVCs but still with significant burden.  Was started on amiodarone.  To continue the same for now per cardiology. Spironolactone has been reinitiated.  Noted to be on Jardiance. Cardiac cath done on November 22 showed normal coronaries.  Cardiac cath was complicated by a severe vagal episode with near loss of pulse.   Hypotension Noted to be on midodrine.  Blood pressure is stable.  Patient is stable.  Okay for discharge home today.  Cleared by cardiology.  PERTINENT LABS:  The results of significant diagnostics from this hospitalization (including imaging, microbiology, ancillary and laboratory) are listed below for reference.    Microbiology: Recent Results (from the past 240 hour(s))  Resp Panel by RT-PCR (Flu A&B, Covid) Nasopharyngeal Swab     Status: None   Collection Time: 07/24/21 12:10 AM   Specimen: Nasopharyngeal Swab; Nasopharyngeal(NP) swabs in vial transport medium  Result Value Ref Range Status   SARS Coronavirus 2 by RT PCR NEGATIVE NEGATIVE Final    Comment: (NOTE) SARS-CoV-2 target nucleic acids are NOT DETECTED.  The SARS-CoV-2 RNA is generally detectable in upper respiratory specimens during the acute phase of infection. The lowest concentration of SARS-CoV-2 viral copies this assay can detect is 138 copies/mL. A negative result does not preclude SARS-Cov-2 infection and should not be used as the sole basis for treatment or other patient management decisions. A negative result may occur with  improper specimen collection/handling, submission of specimen  other than nasopharyngeal swab, presence of viral mutation(s) within the areas targeted by this assay, and inadequate number of viral copies(<138 copies/mL). A negative result must be combined  with clinical observations, patient history, and epidemiological information. The expected result is Negative.  Fact Sheet for Patients:  EntrepreneurPulse.com.au  Fact Sheet for Healthcare Providers:  IncredibleEmployment.be  This test is no t yet approved or cleared by the [place] FDA and  has been authorized for detection and/or diagnosis of SARS-CoV-2 by FDA under an Emergency Use Authorization (EUA). This EUA will remain  in effect (meaning this test can be used) for the duration of the COVID-19 declaration under Section 564(b)(1) of the Act, 21 U.S.C.section 360bbb-3(b)(1), unless the authorization is terminated  or revoked sooner.       Influenza A by PCR NEGATIVE NEGATIVE Final   Influenza B by PCR NEGATIVE NEGATIVE Final    Comment: (NOTE) The Xpert Xpress SARS-CoV-2/FLU/RSV plus assay is intended as an aid in the diagnosis of influenza from Nasopharyngeal swab specimens and should not be used as a sole basis for treatment. Nasal washings and aspirates are unacceptable for Xpert Xpress SARS-CoV-2/FLU/RSV testing.  Fact Sheet for Patients: EntrepreneurPulse.com.au  Fact Sheet for Healthcare Providers: IncredibleEmployment.be  This test is not yet approved or cleared by the [place] FDA and has been authorized for detection and/or diagnosis of SARS-CoV-2 by FDA under an Emergency Use Authorization (EUA). This EUA will remain in effect (meaning this test can be used) for the duration of the COVID-19 declaration under Section 564(b)(1) of the Act, 21 U.S.C. section 360bbb-3(b)(1), unless the authorization is terminated or revoked.  Performed at [facility], [place] [address], [place], [place] [address]   MRSA Next Gen by PCR, Nasal     Status: None   Collection Time: 07/24/21  3:11 AM   Specimen: Nasal Mucosa; Nasal Swab  Result Value Ref Range Status   MRSA by PCR Next Gen NOT  DETECTED NOT DETECTED Final    Comment: (NOTE) The GeneXpert MRSA Assay (FDA approved for NASAL specimens only), is one component of a comprehensive MRSA colonization surveillance program. It is not intended to diagnose MRSA infection nor to guide or monitor treatment for MRSA infections. Test performance is not FDA approved in patients less than 51 years old. Performed at [facility], [place] [address] [place], [place] [address]      Labs:  COVID-19 Labs   Lab Results  Component Value Date   [place] 07/24/2021   [place] NEGATIVE 07/11/2021      Basic Metabolic Panel: Recent Labs  Lab 07/24/21 0621 07/25/21 0928 07/26/21 0309 07/27/21 0236 07/28/21 0208  NA 137 136 138 138 139  K 3.8 3.9 3.9 3.5 4.1  CL 107 108 109 108 108  CO2 21* 19* 21* 20* 25  GLUCOSE 93 80 83 86 123*  BUN 17 10 11 10 14   CREATININE 1.09 1.04 1.12 1.08 1.24  CALCIUM 9.1 8.9 8.8* 9.1 9.0  MG  --   --  1.8 2.2 2.0  PHOS  --   --  3.3 3.4 3.7   Liver Function Tests: Recent Labs  Lab 07/23/21 1629 07/24/21 0010 07/25/21 0928  AST 31 31 57*  ALT 43 42 30  ALKPHOS 62 52 48  BILITOT 1.2 1.3* 2.2*  PROT 7.6 7.3 6.3*  ALBUMIN 4.3 4.2 3.4*    CBC: Recent Labs  Lab 07/24/21 0010 07/24/21 0021 07/24/21 0621 07/25/21 0928 07/26/21 0309 07/27/21  0236 07/28/21 0208  WBC 13.4*  --  9.8 7.9 7.0 8.3 8.2  NEUTROABS 12.0*  --  7.3 4.8  --   --   --   HGB 15.3   < > 14.3 12.9* 12.9* 14.0 14.0  HCT 44.9   < > 42.1 40.0 40.2 42.8 42.2  MCV 88.4  --  87.9 91.3 91.4 88.8 88.7  PLT 289  --  282 243 243 260 300   < > = values in this interval not displayed.    BNP: BNP (last 3 results) Recent Labs    07/11/21 1401 07/23/21 1629  BNP 1,884.0* 716.6*     CBG: Recent Labs  Lab 07/25/21 0737  GLUCAP 71     IMAGING STUDIES DG Chest 2 View  Result Date: 07/11/2021 CLINICAL DATA:  Shortness of breath for 2 days. Nonproductive cough. Former smoker. EXAM: CHEST  - 2 VIEW COMPARISON:  None. FINDINGS: The cardiac silhouette is mildly enlarged. There is peribronchial thickening bilaterally. No confluent airspace opacity, overt pulmonary edema, pleural effusion, or pneumothorax is identified. No acute osseous abnormality is seen. IMPRESSION: Bronchitic changes and mild cardiomegaly. Electronically Signed   By: Logan Bores M.D.   On: 07/11/2021 13:22   CT HEAD WO CONTRAST (5MM)  Result Date: 07/25/2021 CLINICAL DATA:  Stroke-like symptoms with findings of prior distal basilar artery occlusion on CTA of the head. EXAM: CT HEAD WITHOUT CONTRAST TECHNIQUE: Contiguous axial images were obtained from the base of the skull through the vertex without intravenous contrast. COMPARISON:  07/24/2021 FINDINGS: Brain: No evidence of acute infarction, hemorrhage, hydrocephalus, extra-axial collection or mass lesion/mass effect. Vascular: No hyperdense vessel or unexpected calcification. Skull: Normal. Negative for fracture or focal lesion. Sinuses/Orbits: No acute finding. Other: None. IMPRESSION: No acute intracranial abnormality noted. Electronically Signed   By: Inez Catalina M.D.   On: 07/25/2021 00:16   CT HEAD WO CONTRAST (5MM)  Result Date: 07/24/2021 CLINICAL DATA:  Cerebral hemorrhage suspected EXAM: CT HEAD WITHOUT CONTRAST TECHNIQUE: Contiguous axial images were obtained from the base of the skull through the vertex without intravenous contrast. COMPARISON:  CT head 07/24/2021. FINDINGS: Brain: No acute hemorrhage. No definite acute infarct. No mass, mass effect, or midline shift. No hydrocephalus or extra-axial fluid collection. Vascular: Contrast from recent CTA is noted within the vasculature. Previously noted hyperdense vessel is less apparent on this exam. Skull: Normal. Negative for fracture or focal lesion. Sinuses/Orbits: No acute finding. Other: The mastoid air cells are well aerated. IMPRESSION: No acute hemorrhage. Electronically Signed   By: Merilyn Baba M.D.    On: 07/24/2021 01:23   MR BRAIN WO CONTRAST  Result Date: 07/25/2021 CLINICAL DATA:  Stroke-like symptoms, dizziness, vision changes, weakness EXAM: MRI HEAD WITHOUT CONTRAST TECHNIQUE: Multiplanar, multiecho pulse sequences of the brain and surrounding structures were obtained without intravenous contrast. COMPARISON:  No prior MRI head, correlation is made with 07/25/2021 CT head FINDINGS: Brain: Foci of restricted diffusion in the left frontal lobe cortex (series 5, image 85), left posterior temporal and occipital lobe (series 5, image 64 and 66), right pons (series 5, image 3), and right cerebellum (series 5, image 57), which all demonstrate ADC correlates and are consistent with acute infarcts. No acute hemorrhage, mass, mass effect, or midline shift. No extra-axial collection or hydrocephalus. Vascular: Normal flow voids. Skull and upper cervical spine: Normal marrow signal. Sinuses/Orbits: Mucosal thickening in the left sphenoid sinus. The orbits are unremarkable. Other: The mastoids are well aerated. IMPRESSION: Acute infarcts in  left frontal, temporal, and occipital lobes, as well as the right pons and right cerebellum. Given multiple vascular territories, an embolic etiology is suspected. Electronically Signed   By: Merilyn Baba M.D.   On: 07/25/2021 00:29   CARDIAC CATHETERIZATION  Result Date: 07/12/2021   Non-stenotic Dist LAD lesion. Findings: Ao = 84/66 (78) LV = 108/28 RA =  3 RV = 31/4 PA = 32/17 (27) PCW = 21 Fick cardiac output/index = 3.6/1.7 PVR = 1.8 WU SVR = 1690 Ao sat = 97% PA sat = 61% 62% PAPi = 5 Assessment: 1. 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 Elevated filling pressures with markedly depressed CO 4. 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 Will continue NE. Watch in ICU. Titrate meds as tolerated. Will need cMRI and possible transplant w/u. Glori Bickers, MD 7:30 PM  MR CARDIAC MORPHOLOGY W WO CONTRAST  Result Date: 07/15/2021 CLINICAL DATA:  Clinical question of cardiomyopathy Study assumes HCT of 43 and BSA of 2 cm2. EXAM: CARDIAC MRI TECHNIQUE: The patient was scanned on a 1.5 Tesla GE magnet. A dedicated cardiac coil was used. Functional imaging was done using Fiesta sequences. 2,3, and 4 chamber views were done to assess for RWMA's. Modified Simpson's rule using a short axis stack was used to calculate an ejection fraction on a dedicated work Conservation officer, nature. The patient received 8 cc of Gadavist. After 10 minutes inversion recovery sequences were used to assess for infiltration and scar tissue. CONTRAST:  8 cc  of Gadavist FINDINGS: 1. Severely dilated left ventricular size, with LVEDD 89 mm, and LVEDVi 220 mL/m2. Myocardial thinning, with left ventricular thickness, with intraventricular septal thickness of 6 mm, posterior wall thickness of 5 mm, and myocardial mass index of 75 g/m2. Severely reduced left ventricular systolic function (LVEF [id number]). There is inferior and lateral akinesis in the base, mid, and apex. All other segments are severely hypokinetic. Left ventricular parametric mapping notable for diffusely elevated ECV signal (30-45%) with normal T2 signal. There is late gadolinium enhancement in the left ventricular myocardium: Thinning and transmural LGE in the inferior and lateral akinesis in the base, mid, and apex. There is no evidence of LV thrombus. 2.  Mildly increased right ventricular size with RVEDVI 120 mL/m2. Normal right ventricular thickness. Severely reduced right ventricular systolic function. There are no regional wall motion abnormalities or aneurysms. 3.  Normal right atrial size with moderate left atrial enlargement. 4.  Normal size of the aortic root, ascending aorta. Dilation  of the main pulmonary artery, moderate, at 33 mm. 5. Given ECG triggering problems related to severe biventricular function with ectopy, valves are not well assessed in this study. 6.  Normal pericardium.  Small inferior pericardial effusion. 7. Grossly, no extracardiac findings. Recommended dedicated study if concerned for non-cardiac pathology. 8. Breath-hold Artifacts noted. This decreased the sensitivity of late gadolinium enhancement. Absolute volumes: LV EDV: mL (Normal 52-141 mL) LV ESV: mL (Normal 13-51 mL) LV SV: mL (Normal 33-97 mL) CO: L/min (Normal 2.7-6.0 L/min) Indexed volumes: LV EDV: mL/sq-m (Normal 41-81 mL/sq-m) LV ESV: mL/sq-m (Normal 12-21 mL/sq-m) LV SV: mL/sq-m (Normal 26-56 mL/sq-m) CI: L/min/sq-m (Normal 1.8-3.8 L/min/sq-m) Absolute volumes: LV EDV: mL (Normal 77-195 mL) LV ESV: mL (Normal 19-72 mL) LV SV: mL (Normal 51-133 mL) CO: L/min (  Normal 2.8-8.8 L/min) Indexed volumes: LV EDV: mL/sq-m (Normal 47-92 mL/sq-m) LV ESV: mL/sq-m (Normal 13-30 mL/sq-m) LV SV: mL/sq-m (Normal 32-62 mL/sq-m) CI: L/min/sq-m (Normal 1.7-4.2 L/min/sq-m) Right ventricle: Absolute volumes: RV EDV: mL (Normal 58-154 mL) RV ESV: mL (Normal 12-68 mL) RV SV: mL (Normal 35-98 mL) CO: L/min (Normal 2.7-6 L/min) Indexed volumes: RV EDV: mL/sq-m (Normal 48-87 mL/sq-m) RV ESV: mL/sq-m (Normal 11-28 mL/sq-m) RV SV: mL/sq-m (Normal 27-57 mL/sq-m) CI: L/min/sq-m (Normal 1.8-3.8 L/min/sq-m) Absolute volumes: RV EDV: mL (Normal 88-227 mL) RV ESV: mL (Normal 23-103 mL) RV SV: mL (Normal 52-138 mL) CO: L/min (Normal 2.8-8.8 L/min) IMPRESSION: Severe biventricular dysfunction with severe LV dilation and globally elevated ECV signal. Myocardial thinning and scar consistent with a chronic process. Study is consistent with dilated cardiomyopathy Rudean Haskell MD Electronically Signed   By: Rudean Haskell M.D.   On: 07/15/2021 13:06   VAS [place] GROIN PSEUDOANEURYSM  Result Date: 07/26/2021  ARTERIAL PSEUDOANEURYSM   Patient Name:  SHAKI LADEWIG  Date of Exam:   07/25/2021 Medical Rec #: [id number]          Accession #:    [id number] Date of Birth: 09-Jan-1996           Patient Gender: M Patient Age:   62 years Exam Location:  [facility] Procedure:      VAS [place] GROIN PSEUDOANEURYSM Referring Phys: Luanne Bras --------------------------------------------------------------------------------  Exam: Left groin Indications: Patient complains of hematoma post thrombectomy in IR and TPA. Comparison Study: No prior study Performing Technologist: Sharion Dove RVS  Examination Guidelines: A complete evaluation includes B-mode imaging, spectral Doppler, color Doppler, and power Doppler as needed of all accessible portions of each vessel. Bilateral testing is considered an integral part of a complete examination. Limited examinations for reoccurring indications may be performed as noted.  Summary: No evidence of pseudoaneurysm, AVF or DVT. There is hematoma noted.  Diagnosing physician: Orlie Pollen Electronically signed by Orlie Pollen on 07/26/2021 at 10:49:34 AM.   --------------------------------------------------------------------------------    Final    ECHOCARDIOGRAM COMPLETE  Result Date: 07/24/2021    ECHOCARDIOGRAM REPORT   Patient Name:   BHUPINDER PLAGENS Date of Exam: 07/24/2021 Medical Rec #:  [id number]         Height:       71.0 in Accession #:    [id number]        Weight:       177.0 lb Date of Birth:  July 03, 1996          BSA:          2.002 m Patient Age:    25 years          BP:           101/68 mmHg Patient Gender: M                 HR:           91 bpm. Exam Location:  Inpatient Procedure: Limited Echo, Cardiac Doppler, Color Doppler and Intracardiac            Opacification Agent Indications:    Stroke  History:        Patient has prior history of Echocardiogram examinations, most                 recent 07/12/2021. Stroke; Signs/Symptoms:Shortness of Breath.  Sonographer:    Glo Herring  Referring Phys: [id number] [place]  1. Left ventricular ejection fraction, by estimation, is <20%.  The left ventricle has severely decreased function. The left ventricle demonstrates global hypokinesis. The left ventricular internal cavity size was severely dilated. Left ventricular diastolic parameters are consistent with Grade II diastolic dysfunction (pseudonormalization). Elevated left atrial pressure.  2. No LV thrombus seen. There is contrast swirling at apex suggesting sluggish flow but no thrombus seen  3. Right ventricular systolic function is mildly reduced. The right ventricular size is mildly enlarged.  4. The mitral valve is normal in structure. Mild mitral valve regurgitation.  5. The aortic valve was not well visualized. Aortic valve regurgitation is trivial.  6. The inferior vena cava is dilated in size with <50% respiratory variability, suggesting right atrial pressure of 15 mmHg. FINDINGS  Left Ventricle: Left ventricular ejection fraction, by estimation, is <20%. The left ventricle has severely decreased function. The left ventricle demonstrates global hypokinesis. The left ventricular internal cavity size was severely dilated. There is no left ventricular hypertrophy. Left ventricular diastolic parameters are consistent with Grade II diastolic dysfunction (pseudonormalization). Elevated left atrial pressure. Right Ventricle: The right ventricular size is mildly enlarged. Right vetricular wall thickness was not well visualized. Right ventricular systolic function is mildly reduced. Left Atrium: Left atrial size was not assessed. Right Atrium: Right atrial size was not assessed. Pericardium: Trivial pericardial effusion is present. Mitral Valve: The mitral valve is normal in structure. Mild mitral valve regurgitation. Tricuspid Valve: The tricuspid valve is grossly normal. Tricuspid valve regurgitation is trivial. Aortic Valve: The aortic valve was not well visualized. Aortic  valve regurgitation is trivial. Pulmonic Valve: The pulmonic valve was not well visualized. Pulmonic valve regurgitation is not visualized. Aorta: The aortic root is normal in size and structure. Venous: The inferior vena cava is dilated in size with less than 50% respiratory variability, suggesting right atrial pressure of 15 mmHg. IAS/Shunts: The interatrial septum was not well visualized.  LEFT VENTRICLE PLAX 2D LVIDd:         8.00 cm      Diastology LVIDs:         7.00 cm      LV e' medial:    5.98 cm/s LV PW:         0.90 cm      LV E/e' medial:  16.1 LV IVS:        1.00 cm      LV e' lateral:   7.94 cm/s                             LV E/e' lateral: 12.1  LV Volumes (MOD) LV vol d, MOD A2C: 346.0 ml LV vol d, MOD A4C: 385.0 ml LV vol s, MOD A2C: 273.0 ml LV vol s, MOD A4C: 312.0 ml LV SV MOD A2C:     73.0 ml LV SV MOD A4C:     385.0 ml LV SV MOD BP:      72.9 ml IVC IVC diam: 2.20 cm LEFT ATRIUM         Index LA diam:    4.60 cm 2.30 cm/m  MITRAL VALVE MV Area (PHT): 5.42 cm MV Decel Time: 140 msec MV E velocity: 96.00 cm/s MV A velocity: 77.10 cm/s MV E/A ratio:  1.25 Oswaldo Milian MD Electronically signed by Oswaldo Milian MD Signature Date/Time: 07/24/2021/12:23:09 PM    Final    ECHOCARDIOGRAM COMPLETE  Result Date: 07/12/2021    ECHOCARDIOGRAM REPORT   Patient Name:   [name] Date of  Exam: 07/12/2021 Medical Rec #:  [id number]         Height:       71.0 in Accession #:    [id number]        Weight:       187.1 lb Date of Birth:  Nov 15, 1995          BSA:          2.050 m Patient Age:    25 years          BP:           115/88 mmHg Patient Gender: M                 HR:           105 bpm. Exam Location:  Inpatient Procedure: 2D Echo, Cardiac Doppler, Color Doppler and Intracardiac            Opacification Agent Indications:    Dyspnea  History:        Patient has no prior history of Echocardiogram examinations.                 CHF; Signs/Symptoms:Shortness of Breath.  Sonographer:     Glo Herring Referring Phys: [id number] [address]  1. . Left ventricular ejection fraction, by estimation, is <20%. The left ventricle has severely decreased function. The left ventricle demonstrates global hypokinesis. The left ventricular internal cavity size was severely dilated. There is mild eccentric left ventricular hypertrophy. Left ventricular diastolic parameters are consistent with Grade II diastolic dysfunction (pseudonormalizatio n) No LV thrombus with contrast  2. Right ventricular systolic function is severely reduced. The right ventricular size is moderately enlarged. There is mildly elevated pulmonary artery systolic pressure.  3. Left atrial size was severely dilated.  4. Right atrial size was moderately dilated.  5. Thickening of the mitral valve leaflets with posteriorly directed regurgitation. PISA may underestimate the severity with eccentricity. The mitral valve is abnormal. Severe mitral valve regurgitation.  6. Tricuspid valve regurgitation is mild to moderate.  7. The aortic valve is normal in structure. Aortic valve regurgitation is not visualized. No aortic stenosis is present.  8. The inferior vena cava is normal in size with greater than 50% respiratory variability, suggesting right atrial pressure of 3 mmHg. Conclusion(s)/Recommendation(s): Bi-ventricular failure with likely severe functional mitral regurgitation. FINDINGS  Left Ventricle: No LV thrombus with contrast. Left ventricular ejection fraction, by estimation, is <20%. The left ventricle has severely decreased function. The left ventricle demonstrates global hypokinesis. The left ventricular internal cavity size was severely dilated. There is mild eccentric left ventricular hypertrophy. Left ventricular diastolic parameters are consistent with Grade II diastolic dysfunction (pseudonormalization). Right Ventricle: The right ventricular size is moderately enlarged. No increase in right ventricular wall  thickness. Right ventricular systolic function is severely reduced. There is mildly elevated pulmonary artery systolic pressure. The tricuspid regurgitant velocity is 2.98 m/s, and with an assumed right atrial pressure of 3 mmHg, the estimated right ventricular systolic pressure is [id number] mmHg. Left Atrium: Left atrial size was severely dilated. Right Atrium: Right atrial size was moderately dilated. Pericardium: There is no evidence of pericardial effusion. Mitral Valve: Thickening of the mitral valve leaflets with posteriorly directed regurgitation. PISA may underestimate the severity with eccentricity. The mitral valve is abnormal. Severe mitral valve regurgitation. Tricuspid Valve: The tricuspid valve is normal in structure. Tricuspid valve regurgitation is mild to moderate. Aortic Valve: The aortic valve is normal in structure. Aortic valve regurgitation is  not visualized. No aortic stenosis is present. Aortic valve mean gradient measures 2.0 mmHg. Aortic valve peak gradient measures 3.9 mmHg. Aortic valve area, by VTI measures 2.06 cm. Pulmonic Valve: The pulmonic valve was normal in structure. Pulmonic valve regurgitation is not visualized. Aorta: The aortic root and ascending aorta are structurally normal, with no evidence of dilitation. Venous: The inferior vena cava is normal in size with greater than 50% respiratory variability, suggesting right atrial pressure of 3 mmHg. IAS/Shunts: No atrial level shunt detected by color flow Doppler.  LEFT VENTRICLE PLAX 2D LVIDd:         7.60 cm      Diastology LVIDs:         7.10 cm      LV e' medial:    5.55 cm/s LV PW:         1.20 cm      LV E/e' medial:  14.5 LV IVS:        1.20 cm      LV e' lateral:   6.96 cm/s LVOT diam:     2.00 cm      LV E/e' lateral: 11.6 LV SV:         31 LV SV Index:   15 LVOT Area:     3.14 cm  LV Volumes (MOD) LV vol d, MOD A2C: 415.0 ml LV vol d, MOD A4C: 310.0 ml LV vol s, MOD A2C: 333.0 ml LV vol s, MOD A4C: 245.0 ml LV SV MOD A2C:      82.0 ml LV SV MOD A4C:     310.0 ml LV SV MOD BP:      74.2 ml RIGHT VENTRICLE            IVC RV Basal diam:  4.00 cm    IVC diam: 2.30 cm RV S prime:     6.09 cm/s LEFT ATRIUM              Index        RIGHT ATRIUM           Index LA Vol (A2C):   119.0 ml 58.06 ml/m  RA Area:     17.10 cm LA Vol (A4C):   108.0 ml 52.69 ml/m  RA Volume:   45.40 ml  22.15 ml/m LA Biplane Vol: 118.0 ml 57.57 ml/m  AORTIC VALVE                    PULMONIC VALVE AV Area (Vmax):    2.32 cm     PV Vmax:       0.70 m/s AV Area (Vmean):   2.14 cm     PV Peak grad:  1.9 mmHg AV Area (VTI):     2.06 cm AV Vmax:           99.10 cm/s AV Vmean:          68.800 cm/s AV VTI:            0.151 m AV Peak Grad:      3.9 mmHg AV Mean Grad:      2.0 mmHg LVOT Vmax:         73.10 cm/s LVOT Vmean:        46.900 cm/s LVOT VTI:          0.099 m LVOT/AV VTI ratio: 0.65  AORTA Ao Root diam: 2.60 cm Ao Asc diam:  2.20 cm MITRAL VALVE  TRICUSPID VALVE MV Area (PHT): 6.07 cm       TR Peak grad:   35.5 mmHg MV Decel Time: 125 msec       TR Vmax:        298.00 cm/s MR Peak grad:    85.4 mmHg MR Mean grad:    48.0 mmHg    SHUNTS MR Vmax:         462.00 cm/s  Systemic VTI:  0.10 m MR Vmean:        318.0 cm/s   Systemic Diam: 2.00 cm MR PISA:         4.02 cm MR PISA Eff ROA: 28 mm MR PISA Radius:  0.80 cm MV E velocity: 80.60 cm/s MV A velocity: 47.60 cm/s MV E/A ratio:  1.69 Mary Scientist, physiological signed by Phineas Inches Signature Date/Time: 07/12/2021/11:51:39 AM    Final    CT HEAD CODE STROKE WO CONTRAST  Result Date: 07/24/2021 CLINICAL DATA:  Code stroke.  Left facial numbness EXAM: CT HEAD WITHOUT CONTRAST TECHNIQUE: Contiguous axial images were obtained from the base of the skull through the vertex without intravenous contrast. COMPARISON:  None. FINDINGS: Brain: Possible subtle hypodensity high right posterior frontal and anterior parietal lobe, although this may be artifactual. No acute hemorrhage, mass, mass effect, or  midline shift. Vascular: Hyperdensity in the basilar artery (series 2, image 10). Skull: Normal. Negative for fracture or focal lesion. Sinuses/Orbits: No acute finding. Other: The mastoids are well aerated. ASPECTS [facility] Stroke Program Early CT Score) - Ganglionic level infarction (caudate, lentiform nuclei, internal capsule, insula, M1-M3 cortex): 7 - Supraganglionic infarction (M4-M6 cortex): 3 Total score (0-10 with 10 being normal): 10 IMPRESSION: 1. Hyperdensity in the basilar artery, concerning for thrombus. 2. Possible subtle hypodensity in the high right posterior frontal and anterior parietal lobe, although this may be artifactual. 3. ASPECTS is 10. Code stroke imaging results were communicated on 07/24/2021 at 12:35 am to provider Dr. Lorrin Goodell via telephone, who verbally acknowledged these results. Electronically Signed   By: Merilyn Baba M.D.   On: 07/24/2021 00:37   CT ANGIO HEAD CODE STROKE  Result Date: 07/24/2021 CLINICAL DATA:  Stroke suspected EXAM: CT ANGIOGRAPHY HEAD AND NECK TECHNIQUE: Multidetector CT imaging of the head and neck was performed using the standard protocol during bolus administration of intravenous contrast. Multiplanar CT image reconstructions and MIPs were obtained to evaluate the vascular anatomy. Carotid stenosis measurements (when applicable) are obtained utilizing NASCET criteria, using the distal internal carotid diameter as the denominator. CONTRAST:  25mL OMNIPAQUE IOHEXOL 350 MG/ML SOLN COMPARISON:  No prior CTA, correlation is made with same day CT head FINDINGS: CT HEAD FINDINGS For noncontrast findings, please see same day CT head. CTA NECK FINDINGS Aortic arch: Standard branching. Imaged portion shows no evidence of aneurysm or dissection. No significant stenosis of the major arch vessel origins. Right carotid system: No evidence of dissection, stenosis (50% or greater) or occlusion. Left carotid system: No evidence of dissection, stenosis (50% or  greater) or occlusion. Vertebral arteries: Right dominant. No evidence of dissection, stenosis (50% or greater) or occlusion. Skeleton: Negative. Other neck: Negative. Upper chest: 10 mm ground-glass opacity in the anterior right upper lobe (series 6, image 5). No pleural effusion. Review of the MIP images confirms the above findings CTA HEAD FINDINGS Anterior circulation: Both internal carotid arteries are patent to the termini, without stenosis or other abnormality. A1 segments patent. Normal anterior communicating artery. Anterior cerebral arteries are patent to their distal  aspects. No M1 stenosis or occlusion. Normal MCA bifurcations. Distal MCA branches perfused and symmetric. 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 Contrast is noted in the bilateral PCAs, which may indicate distal reconstitution or flow from the posterior communicating arteries, which are not definitively visualized. The bilateral vertebral arteries are patent to the vertebrobasilar junction. Bilateral posterior inferior cerebellar arteries are patent. Venous sinuses: As permitted by contrast timing, patent. Anatomic variants: None significant Review of the MIP images confirms the above findings IMPRESSION: 1. 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 Flow is noted in the bilateral posterior cerebral arteries. 2. No other intracranial large vessel occlusion or significant stenosis. 3. No hemodynamically significant stenosis in the neck. 4. Ground-glass opacity in the partially imaged right upper lobe, which is nonspecific but could be infectious or inflammatory. Code stroke imaging results were communicated on 07/24/2021 at 12:51 am to provider Dr. Lorrin Goodell via telephone, who verbally acknowledged these results. Electronically Signed   By: Merilyn Baba M.D.   On: 07/24/2021 00:53   CT ANGIO NECK CODE STROKE  Result Date: 07/24/2021 CLINICAL DATA:  Stroke suspected EXAM: CT ANGIOGRAPHY HEAD AND NECK TECHNIQUE: Multidetector CT imaging of the head and neck was performed using the standard protocol during bolus administration of intravenous contrast. Multiplanar CT image reconstructions and MIPs were obtained to evaluate the vascular anatomy. Carotid stenosis measurements (when applicable) are obtained utilizing NASCET criteria, using the distal internal carotid diameter as the denominator. CONTRAST:  59mL OMNIPAQUE IOHEXOL 350 MG/ML SOLN COMPARISON:  No prior CTA, correlation is made with same day CT head FINDINGS: CT HEAD FINDINGS For noncontrast findings, please see same day CT head. CTA NECK FINDINGS Aortic arch: Standard branching. Imaged portion shows no evidence of aneurysm or dissection. No significant stenosis of the major arch vessel origins. Right carotid system: No evidence of dissection, stenosis (50% or greater) or occlusion. Left carotid system: No evidence of dissection, stenosis (50% or greater) or occlusion. Vertebral arteries: Right dominant. No evidence of dissection, stenosis (50% or greater) or occlusion. Skeleton: Negative. Other neck: Negative. Upper chest: 10 mm ground-glass opacity in the anterior right upper lobe (series 6, image 5). No pleural effusion. Review of the MIP images confirms the above findings CTA HEAD FINDINGS Anterior circulation: Both internal carotid arteries are patent to the termini, without stenosis or other abnormality. A1 segments patent. Normal anterior communicating artery. Anterior cerebral arteries are patent to their distal aspects. No M1 stenosis or occlusion. Normal MCA bifurcations. Distal MCA branches perfused and symmetric. 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 Contrast is noted in the  bilateral PCAs, which may indicate distal reconstitution or flow from the posterior communicating arteries, which are not definitively visualized. The bilateral vertebral arteries are patent to the vertebrobasilar junction. Bilateral posterior inferior cerebellar arteries are patent. Venous sinuses: As permitted by contrast timing, patent. Anatomic variants: None significant Review of the MIP images confirms the above findings IMPRESSION: 1. 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 Flow is noted in the bilateral posterior cerebral arteries. 2. No other intracranial large vessel occlusion or significant stenosis. 3. No hemodynamically significant stenosis in the neck. 4. Ground-glass opacity in the partially imaged right upper lobe, which is  nonspecific but could be infectious or inflammatory. Code stroke imaging results were communicated on 07/24/2021 at 12:51 am to provider Dr. Derry Lory via telephone, who verbally acknowledged these results. Electronically Signed   By: Wiliam Ke M.D.   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 EXAM: 1. EMERGENT LARGE VESSEL OCCLUSION THROMBOLYSIS (POSTERIOR CIRCULATION) COMPARISON:  CT angiogram of the head and neck of July 24, 2021. MEDICATIONS: Ancef 2 g IV antibiotic was administered within 1 hour of the procedure. ANESTHESIA/SEDATION: General anesthesia. CONTRAST:  Omnipaque 300 approximately 60 mL. FLUOROSCOPY TIME:  Fluoroscopy Time: 7 minutes 12 seconds (1114 mGy). COMPLICATIONS: None immediate. TECHNIQUE: Following a full explanation of the procedure along with the potential associated complications, an informed witnessed consent was obtained. The risks of intracranial hemorrhage of 10%, worsening neurological deficit, ventilator  dependency, death and inability to revascularize were all reviewed in detail with the patient. The patient was then put under general anesthesia by the Department of Anesthesiology at [facility]. The right groin was prepped and draped in the usual sterile fashion. Thereafter using modified Seldinger technique, transfemoral access into the right common femoral artery was obtained without difficulty. Over a 0.035 inch guidewire an 8 [place] Pinnacle sheath 25 cm was inserted. Through this, and also over a 0.035 inch guidewire a 5 [place] JB 1 catheter was advanced to the aortic arch region and selectively positioned in the right vertebral artery, the right common carotid artery, the left common carotid artery and the left vertebral artery. FINDINGS: The origin of the right vertebral artery is widely patent. The vessel is seen to opacify to the cranial skull base. Wide patency is seen of the right vertebrobasilar junction and the right posterior-inferior cerebellar artery. Wide patency of the basilar artery, the posterior cerebral arteries, the superior cerebellar arteries and the anterior-inferior cerebellar arteries is seen. Previously noted thrombus in the distal basilar artery is no longer evident. Right common carotid arteriogram demonstrates the right external carotid artery and its major branches to be widely patent. The right internal carotid artery at the bulb to the cranial skull base is widely patent. The petrous, the cavernous and the supraclinoid segments demonstrate patency. The right middle cerebral artery and the anterior cerebral artery opacify into the capillary and venous phases. Prompt cross-filling via the anterior communicating artery of the left anterior cerebral A2 segment and distally is noted. Common carotid arteriogram demonstrates the left external carotid artery and its major branches to be widely patent. The left internal carotid artery at the bulb to the cranial skull base is  widely patent. The petrous, cavernous and supraclinoid segments demonstrate wide patency. The left middle cerebral artery and the left anterior cerebral artery demonstrate patency of the vascular distribution without evidence of occlusions or of intraluminal filling defects. The left vertebral artery origin is widely patent. The vessel is seen to opacify to the cranial skull base. Patency is maintained of the left vertebrobasilar junction and the left posterior-inferior cerebellar artery. The opacified portions of the basilar artery, the superior cerebellar arteries and the anterior inferior cerebellar arteries demonstrate wide patency. PROCEDURE: See above. IMPRESSION: Angiographically no evidence of previously noted thrombus in the distal basilar artery as noted on the previous CTA of the head and neck. PLAN: Follow-up as per referring MD. Electronically Signed   By: Julieanne Cotton M.D.   On: 07/26/2021 08:26  DISCHARGE EXAMINATION: Vitals:   07/28/21 0012 07/28/21 0420 07/28/21 0720 07/28/21 1105  BP: 94/74 (!) 87/68 92/70 94/77   Pulse: 94 79 68 77  Resp: 16 16 18 18   Temp: 98.2 F (36.8 C) 98.5 F (36.9 C) 98.2 F (36.8 C) 98.2 F (36.8 C)  TempSrc: Oral Oral Oral Oral  SpO2: 99% 100% 100% 98%  Weight:      Height:       General appearance: Awake alert.  In no distress Resp: Clear to auscultation bilaterally.  Normal effort Cardio: S1-S2 is normal regular.  No S3-S4.  No rubs murmurs or bruit GI: Abdomen is soft.  Nontender nondistended.  Bowel sounds are present normal.  No masses organomegaly   DISPOSITION: Home  Discharge Instructions     Ambulatory referral to Neurology   Complete by: As directed    Follow up with stroke clinic NP (Jessica Vanschaick or Cecille Rubin, if both not available, consider Zachery Dauer, or [facility]) at [facility] in about 4 weeks. Thanks.   Call MD for:  difficulty breathing, headache or visual disturbances   Complete by: As directed    Call MD for:   extreme fatigue   Complete by: As directed    Call MD for:  persistant dizziness or light-headedness   Complete by: As directed    Call MD for:  persistant nausea and vomiting   Complete by: As directed    Call MD for:  severe uncontrolled pain   Complete by: As directed    Call MD for:  temperature >100.4   Complete by: As directed    Diet - low sodium heart healthy   Complete by: As directed    Discharge instructions   Complete by: As directed    Please take your medications as prescribed.  Be sure to follow-up with your heart failure clinic as well as with neurology.  Continue wearing lifevest.  You were cared for by a hospitalist during your hospital stay. If you have any questions about your discharge medications or the care you received while you were in the hospital after you are discharged, you can call the unit and asked to speak with the hospitalist on call if the hospitalist that took care of you is not available. Once you are discharged, your primary care physician will handle any further medical issues. Please note that NO REFILLS for any discharge medications will be authorized once you are discharged, as it is imperative that you return to your primary care physician (or establish a relationship with a primary care physician if you do not have one) for your aftercare needs so that they can reassess your need for medications and monitor your lab values. If you do not have a primary care physician, you can call [phone] for a physician referral.   Increase activity slowly   Complete by: As directed    No wound care   Complete by: As directed          Allergies as of 07/28/2021   No Known Allergies      Medication List     STOP taking these medications    acetaminophen 500 MG tablet Commonly known as: TYLENOL   losartan 25 MG tablet Commonly known as: COZAAR       TAKE these medications    albuterol 108 (90 Base) MCG/ACT inhaler Commonly known as:  VENTOLIN HFA Inhale 2 puffs into the lungs every 6 (six) hours as needed for wheezing.   amiodarone 200 MG  tablet Commonly known as: PACERONE Take 400mg  (2 tablets) twice daily for 7 days, then take 200mg  (1 tablet) twice daily for 7 days, then take 200mg  (1 tablet) once daily.   apixaban 5 MG Tabs tablet Commonly known as: ELIQUIS Take 1 tablet (5 mg total) by mouth 2 (two) times daily.   atorvastatin 40 MG tablet Commonly known as: LIPITOR Take 1 tablet (40 mg total) by mouth daily.   digoxin 0.125 MG tablet Commonly known as: LANOXIN Take 1 tablet (0.125 mg total) by mouth daily.   Jardiance 10 MG Tabs tablet Generic drug: empagliflozin Take 1 tablet (10 mg total) by mouth daily.   mexiletine 150 MG capsule Commonly known as: MEXITIL Take 2 capsules (300 mg total) by mouth every 12 (twelve) hours. What changed: when to take this   midodrine 2.5 MG tablet Commonly known as: PROAMATINE Take 1 tablet (2.5 mg total) by mouth 2 (two) times daily with a meal.   spironolactone 25 MG tablet Commonly known as: ALDACTONE Take 0.5 tablets (12.5 mg total) by mouth daily. What changed: how much to take          Follow-up Information     [facility]. Schedule an appointment as soon as possible for a visit in 1 month(s).   Specialty: Neurology Why: stroke clinic Contact information: [address] [phone]                TOTAL DISCHARGE TIME: 35 minutes  [place]  [facility] Pager on [email or web address]  07/29/2021, 11:52 AM

## 2021-07-31 ENCOUNTER — Encounter (HOSPITAL_COMMUNITY): Payer: Self-pay | Admitting: Internal Medicine

## 2021-08-05 ENCOUNTER — Ambulatory Visit (HOSPITAL_COMMUNITY)
Admission: RE | Admit: 2021-08-05 | Discharge: 2021-08-05 | Disposition: A | Discharge status: Home / Self Care | Payer: 59 | Source: Ambulatory Visit | Attending: Internal Medicine | Admitting: Internal Medicine

## 2021-08-05 ENCOUNTER — Encounter (HOSPITAL_COMMUNITY): Payer: Self-pay | Admitting: Internal Medicine

## 2021-08-05 ENCOUNTER — Other Ambulatory Visit (HOSPITAL_COMMUNITY): Payer: Self-pay

## 2021-08-05 ENCOUNTER — Other Ambulatory Visit: Payer: Self-pay

## 2021-08-05 ENCOUNTER — Other Ambulatory Visit (HOSPITAL_COMMUNITY): Payer: Self-pay | Admitting: *Deleted

## 2021-08-05 VITALS — BP 120/82 | HR 90 | Wt 178.0 lb

## 2021-08-05 DIAGNOSIS — I5022 Chronic systolic (congestive) heart failure: Secondary | ICD-10-CM | POA: Diagnosis present

## 2021-08-05 DIAGNOSIS — I493 Ventricular premature depolarization: Secondary | ICD-10-CM | POA: Diagnosis not present

## 2021-08-05 DIAGNOSIS — I5082 Biventricular heart failure: Secondary | ICD-10-CM | POA: Diagnosis not present

## 2021-08-05 DIAGNOSIS — Z09 Encounter for follow-up examination after completed treatment for conditions other than malignant neoplasm: Secondary | ICD-10-CM | POA: Insufficient documentation

## 2021-08-05 DIAGNOSIS — Z7984 Long term (current) use of oral hypoglycemic drugs: Secondary | ICD-10-CM | POA: Diagnosis not present

## 2021-08-05 DIAGNOSIS — Z6740 Type O blood, Rh positive: Secondary | ICD-10-CM | POA: Insufficient documentation

## 2021-08-05 DIAGNOSIS — Z7901 Long term (current) use of anticoagulants: Secondary | ICD-10-CM | POA: Insufficient documentation

## 2021-08-05 DIAGNOSIS — Z87442 Personal history of urinary calculi: Secondary | ICD-10-CM

## 2021-08-05 DIAGNOSIS — L905 Scar conditions and fibrosis of skin: Secondary | ICD-10-CM | POA: Diagnosis not present

## 2021-08-05 DIAGNOSIS — Z8673 Personal history of transient ischemic attack (TIA), and cerebral infarction without residual deficits: Secondary | ICD-10-CM | POA: Insufficient documentation

## 2021-08-05 DIAGNOSIS — I509 Heart failure, unspecified: Secondary | ICD-10-CM | POA: Diagnosis not present

## 2021-08-05 DIAGNOSIS — Z79899 Other long term (current) drug therapy: Secondary | ICD-10-CM | POA: Diagnosis not present

## 2021-08-05 LAB — URINALYSIS, ROUTINE W REFLEX MICROSCOPIC
Bacteria, UA: NONE SEEN
Bilirubin Urine: NEGATIVE
Glucose, UA: >=500 mg/dL — AB
Ketones, ur: NEGATIVE mg/dL
Leukocytes,Ua: NEGATIVE
Nitrite: NEGATIVE
Protein, ur: NEGATIVE mg/dL
RBC / HPF: >50 RBC/hpf — ABNORMAL HIGH (ref 0–5)
Specific Gravity, Urine: 1.021 (ref 1.005–1.030)
pH: 5 (ref 5.0–8.0)

## 2021-08-05 LAB — BASIC METABOLIC PANEL
Anion gap: 8 (ref 5–15)
BUN: 14 mg/dL (ref 6–20)
CO2: 20 mmol/L — ABNORMAL LOW (ref 22–32)
Calcium: 9.5 mg/dL (ref 8.9–10.3)
Chloride: 108 mmol/L (ref 98–111)
Creatinine, Ser: 1.32 mg/dL — ABNORMAL HIGH (ref 0.61–1.24)
GFR, Estimated: >60 mL/min (ref >60)
Glucose, Bld: 131 mg/dL — ABNORMAL HIGH (ref 70–99)
Potassium: 3.9 mmol/L (ref 3.5–5.1)
Sodium: 136 mmol/L (ref 135–145)

## 2021-08-05 LAB — BRAIN NATRIURETIC PEPTIDE: B Natriuretic Peptide: 1096.1 pg/mL — ABNORMAL HIGH (ref 0.0–100.0)

## 2021-08-05 LAB — MAGNESIUM: Magnesium: 2.2 mg/dL (ref 1.7–2.4)

## 2021-08-05 MED ORDER — DIGOXIN 125 MCG PO TABS: 0.1250 mg | ORAL_TABLET | Freq: Every day | ORAL | 6 refills | Status: DC

## 2021-08-05 NOTE — Progress Notes (Signed)
[facility] CLINIC NOTE  PCP:Pcp, No Primary Cardiologist: DB  HPI:  Thomas Wood is a 25 y/o male with h/o morbid obesity s/p marked weight loss admitted in 11/22 with acute systolic HF EF 10-15% with LVIDd > 7cm. Father (Donal Lorrin Goodell) with h/o cardiac arrest/HF due to NICM and has undergone VAD placement (here) and OHTx at [facility].    R/LHC showed normal coronary arteries and low output, CI 1.7. Complicated by severe vagal event and near arrest in the cath lab, resuscitated with epinephrine, NE and IVF. Transferred to ICU. Able to wean off NE. GDMT introduced. cMRI showed severe biventricular dysfunction w/ severe LV dilation and globally elevated ECV signal, w/ myocardial thinning and scar c/w a chronic process. LVEF 9%. RVEF 2% No LV thrombus.    Had high PVC burden  >20%. Started on mexiletine with dose increased to 300 mg twice a day . PVC burden reduced. Discharged home w/ LifeVest.   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 Treated with t-PA with complete resolution of symptoms. Cerebral arteriogram with no clot. Repeat echo EF <20% RV moderately down. No frank LV clot. Apixaban started. Had recurrent PVCs so amio added to mexilitene for PVC suppression. BP low so midodrine added and losartan held    Here for post-hospital f/u. Feels good. Taking BP at night. SBP 115-120. Complaint with LifeVest. Does ADLs without problems. Mild bruising. No bleeding. Palpitations improved.   ROS: All systems negative except as listed in HPI, PMH and Problem List.  SH:  Social History   Socioeconomic History   Marital status: Single    Spouse name: Not on file   Number of children: Not on file   Years of education: Not on file   Highest education level: Not on file  Occupational History   Not on file  Tobacco Use   Smoking status: Never   Smokeless tobacco: Never  Substance and Sexual Activity   Alcohol use: No   Drug use: No   Sexual activity: Yes    Birth control/protection: Pill  Other  Topics Concern   Not on file  Social History Narrative   Not on file   Social Determinants of Health   Financial Resource Strain: Not on file  Food Insecurity: Not on file  Transportation Needs: Not on file  Physical Activity: Not on file  Stress: Not on file  Social Connections: Not on file  Intimate Partner Violence: Not on file    FH:  Family History  Problem Relation Age of Onset   Asthma Mother    Cancer Mother        breast   Miscarriages / [place] Mother    Kidney disease Mother        stones   Diabetes Father    Heart disease Father 44        MI   Heart failure Father        LVAD>>Heart Transplant (39s)   Asthma Brother     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   Chronic kidney disease    stones   GERD (gastroesophageal reflux disease)    New onset of congestive heart failure (HCC) 07/11/2021    Current Outpatient Medications  Medication Sig Dispense Refill   albuterol (VENTOLIN HFA) 108 (90 Base) MCG/ACT inhaler Inhale 2 puffs into the lungs every 6 (six) hours as needed for wheezing.  amiodarone (PACERONE) 200 MG tablet Take 400mg  (2 tablets) twice daily for 7 days, then take 200mg  (1 tablet) twice daily for 7 days, then take 200mg  (1 tablet) once daily. 120 tablet 1   apixaban (ELIQUIS) 5 MG TABS tablet Take 1 tablet (5 mg total) by mouth 2 (two) times daily. 60 tablet 1   atorvastatin (LIPITOR) 40 MG tablet Take 1 tablet (40 mg total) by mouth daily. 30 tablet 1   digoxin (LANOXIN) 0.125 MG tablet Take 1 tablet (0.125 mg total) by mouth daily. 30 tablet 6   empagliflozin (JARDIANCE) 10 MG TABS tablet Take 1 tablet (10 mg total) by mouth daily. 30 tablet 6   mexiletine (MEXITIL) 150 MG capsule Take 2 capsules (300 mg total) by mouth every 12 (twelve) hours. 60 capsule 1   midodrine (PROAMATINE) 2.5 MG tablet Take 1 tablet (2.5 mg total) by mouth 2 (two)  times daily with a meal. 60 tablet 1   spironolactone (ALDACTONE) 25 MG tablet Take 25 mg by mouth daily.     No current facility-administered medications for this encounter.    Vitals:   08/05/21 0959  BP: 120/82  Pulse: 90  SpO2: 99%  Weight: 80.7 kg (178 lb)    PHYSICAL EXAM:  General:  Well appearing. No resp difficulty HEENT: normal Neck: supple. no JVD. Carotids 2+ bilat; no bruits. No lymphadenopathy or thryomegaly appreciated. Cor: PMI nondisplaced. Regular rate & rhythm. No rubs, gallops or murmurs. Wearing lifevest Lungs: clear Abdomen: soft, nontender, nondistended. No hepatosplenomegaly. No bruits or masses. Good bowel sounds. Extremities: no cyanosis, clubbing, rash, edema Neuro: alert & orientedx3, cranial nerves grossly intact. moves all 4 extremities w/o difficulty. Affect pleasant   ECG: NSR 97 QRS 98ms  no PVCs Personally reviewed  LifeVest interrogation: No VT  ASSESSMENT & PLAN:  1. 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 Cath complicated by severe vagal episode with near loss of pulse. - cMRI - severe biventricular dysfunciton myocardial thinning and scar. LVEF 9% RVEF 2% - Concern for familial CM. Possible contribution from frequent PVCs, Father had CM in 38s and had LVAD as bridge to eventual transplant  - Stable NYHA II - Continue dig 0.125 - Continue Jardiance 10 - Continue spiro 25 daily  - Off losartan due to low BP  - Will stop midodrine today. Follow BP. IF SBP consistently << 95 or he is dizzy should restart  - PharmD visit in 2 weeks to recheck BP and potentially restart losartan 12.5 qhs  - Labs today - Not on b-blocker yet with recent low output - Blood Type O-pos - Continue LifeVest - Will see him back in 4-6 weeks with repeat echo if EF improving with PVC suppression will  continue to pursue GDMT titration in hopes for LV recovery. If no improvement will refer to [facility] to get him plugged in for possible transplant w/u. Given cMRI I am concerned he may not get much LV recovery - Will need eventual CPX and genetic eval   2. Severe MR -  functional due to severely dilated LV    3. Frequent PVCs - TSH normal  - ~20 PVCs per minute (two primary morphologies) - Mexilitene 300 bid started 11/12. Amio added -  PVCs completely suppressed on ECG and exam today - outpatient sleep study to r/o OSA    Glori Bickers,  MD  10:23 AM

## 2021-08-05 NOTE — Progress Notes (Signed)
PCP:Pcp, No Primary Cardiologist: Dr. Haroldine Laws  HPI:  Thomas Wood is a 25 y/o male with h/o morbid obesity s/p marked weight loss admitted in 07/2021 with acute systolic HF EF [id number] with LVIDd > 7cm. Father ([name]) with h/o cardiac arrest/HF due to NICM and has undergone VAD placement (here) and OHTx at [facility].    R/LHC showed normal coronary arteries and low output, CI 1.7. Complicated by severe vagal event and near arrest in the cath lab, resuscitated with epinephrine, NE and IVF. Transferred to ICU. Able to wean off NE. GDMT introduced. cMRI showed severe biventricular dysfunction w/ severe LV dilation and globally elevated ECV signal, w/ myocardial thinning and scar c/w a chronic process. LVEF 9%. RVEF 2% No LV thrombus.    Had high PVC burden  >20%. Started on mexiletine with dose increased to 300 mg twice a day. PVC burden reduced. Discharged home w/ LifeVest.    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 Treated with t-PA with complete resolution of symptoms. Cerebral arteriogram with no clot. Repeat echo EF <20% RV moderately down. No frank LV clot. Apixaban started. Had recurrent PVCs so amiodarone added to mexiletine for PVC suppression. BP low so midodrine added and losartan held.     Recently presented to [facility] for post-hospital f/u with Dr. Haroldine Laws 08/05/21. Overall felt good. Reported taking BP at night, SBP 115-120. Compliant with LifeVest. Able to do ADLs without problems. Mild bruising. No bleeding. Palpitations had improved.  Today he returns to [facility] clinic for pharmacist medication titration. At last visit with MD midodrine was discontinued. Overall he is feeling well today. Says he usually feels a little lightheaded around 12-2 PM on most days but this resolves after he eats. No fatigue or chest pain. Notes palpitations a few times per day. Usually last ~15 minutes. They resolve after he takes deep breaths and controls his breathing. No SOB/DOE. Weight has been  stable at home, 174-175 lbs. No LEE, PND or orthopnea. BP at home has been 115-125/80-90. BP in clinic 122/82. Taking all medications as prescribed and tolerating all medications.   HF Medications: Spironolactone 25 mg daily Jardiance 10 mg daily Digoxin 0.125 mg daily  Has the patient been experiencing any side effects to the medications prescribed?  no  Does the patient have any problems obtaining medications due to transportation or finances?   No - [employer]  Understanding of regimen: good Understanding of indications: good Potential of compliance: good Patient understands to avoid NSAIDs. Patient understands to avoid decongestants.    Pertinent Lab Values: 08/05/21: Serum creatinine 1.32, BUN 14, Potassium 3.9, Sodium 136. BNP 1,096.1 pg/mL  Vital Signs: Weight: 178.2 lbs (last clinic weight: 178 lbs) Blood pressure: 122/82  Heart rate: 75   Assessment/Plan: 1. 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 Cath complicated by severe vagal episode with near loss of pulse. - cMRI - severe biventricular dysfunciton myocardial thinning and scar. LVEF 9% RVEF 2% - Concern for familial CM. Possible contribution from frequent PVCs, Father had CM in 45s and had LVAD as bridge to eventual transplant  - Stable NYHA II, euvolemic on exam. - Start losartan 12.5 mg QHS. Repeat BMET in 2 weeks.  - Continue spironolactone 25 mg daily  - Continue Jardiance 10 mg daily - Continue digoxin 0.125 mg daily - Not on b-blocker yet with  recent low output - Blood Type O-pos - Continue LifeVest - Repeat echo scheduled for 09/18/21. If EF improving with PVC suppression, will continue to pursue GDMT titration in hopes for LV recovery. If no improvement will refer to [facility] to get him plugged in for possible transplant w/u. Given cMRI,  concerned he may not get much LV recovery - Will need eventual CPX and genetic eval   2. Severe MR -  functional due to severely dilated LV    3. Frequent PVCs - TSH normal  - ~20 PVCs per minute (two primary morphologies) - Mexiletine 300 mg BID started 07/13/21. Amiodarone added 07/28/21. -  PVCs suppressed on ECG 08/05/21 - outpatient sleep study to r/o OSA     Follow up 1 month with Dr. Emilio Aspen.   Karle Plumber, PharmD, BCPS, BCCP, CPP Heart Failure Clinic Pharmacist [phone]

## 2021-08-05 NOTE — Patient Instructions (Signed)
Medication Changes:  Stop midodrine, if BP remains less than 95 systolic or you get dizzy you can restart it.  Lab Work:  Labs done today, your results will be available in MyChart, we will contact you for abnormal readings.   Testing/Procedures:  Echocardiogram    Referrals:  Pharmacy  Special Instructions // Education:    Follow-Up in: 4-6 weeks  At the [facility], you and your health needs are our priority. We have a designated team specialized in the treatment of Heart Failure. This Care Team includes your primary Heart Failure Specialized Cardiologist (physician), Advanced Practice Providers (APPs- Physician Assistants and Nurse Practitioners), and Pharmacist who all work together to provide you with the care you need, when you need it.   You may see any of the following providers on your designated Care Team at your next follow up:  Dr Arvilla Meres Dr Carron Curie, NP Robbie Lis, [place] [facility] [place], [place] Karle Plumber, PharmD   Please be sure to bring in all your medications bottles to every appointment.   Need to Contact us:  If you have any questions or concerns before your next appointment please send [place] a message through [place] or call our office at [phone].    TO LEAVE A MESSAGE FOR THE NURSE SELECT OPTION 2, PLEASE LEAVE A MESSAGE INCLUDING: YOUR NAME DATE OF BIRTH CALL BACK NUMBER REASON FOR CALL**this is important as we prioritize the call backs  YOU WILL RECEIVE A CALL BACK THE SAME DAY AS LONG AS YOU CALL BEFORE 4:00 PM

## 2021-08-06 LAB — URINE CULTURE: Culture: NO GROWTH

## 2021-08-08 ENCOUNTER — Encounter (HOSPITAL_COMMUNITY): Payer: Self-pay | Admitting: Internal Medicine

## 2021-08-08 ENCOUNTER — Other Ambulatory Visit (HOSPITAL_COMMUNITY): Payer: Self-pay | Admitting: *Deleted

## 2021-08-08 DIAGNOSIS — R1084 Generalized abdominal pain: Secondary | ICD-10-CM

## 2021-08-09 ENCOUNTER — Telehealth (HOSPITAL_COMMUNITY): Payer: Self-pay

## 2021-08-09 ENCOUNTER — Telehealth: Payer: Self-pay | Admitting: Pharmacist

## 2021-08-09 ENCOUNTER — Other Ambulatory Visit (HOSPITAL_COMMUNITY): Payer: Self-pay

## 2021-08-09 NOTE — Telephone Encounter (Signed)
Received a fax requesting medical records from [facility] Claims Management. Records were successfully faxed to: [phone] ,which was the number provided.. Medical request form will be scanned into patients chart.

## 2021-08-09 NOTE — Telephone Encounter (Signed)
Pharmacy Transitions of Care Follow-up Telephone Call  Date of discharge: 07/28/2021  Discharge Diagnosis: Acute CVA  How have you been since you were released from the hospital? Patient has been doing well since he was released from the hospital.  He has been in touch with Dr. Prescott Gum office with questions regarding dark colored urine and amiodarone dosing.   Medication changes made at discharge:  - START: Apixaban, Amiodarone, midodrine  - STOPPED: acetaminophen, losartan  - CHANGED: mexiletine, spironolactone  Medication changes verified by the patient? Yes     Medication Accessibility:  [facility] Pharmacy: [facility]   Was the patient provided with refills on discharged medications? yes   Have all prescriptions been transferred from [facility] to home pharmacy? yes   Is the patient able to afford medications? N/a Notable copays: n/a Eligible patient assistance: n/a    Medication Review: APIXABAN (ELIQUIS)  Apixaban 5 mg BID  - Discussed importance of taking medication around the same time everyday  - Reviewed potential DDIs with patient  - Advised patient of medications to avoid (NSAIDs, ASA)  - Educated that Tylenol (acetaminophen) will be the preferred analgesic to prevent risk of bleeding  - Emphasized importance of monitoring for signs and symptoms of bleeding (abnormal bruising, prolonged bleeding, nose bleeds, bleeding from gums, discolored urine, black tarry stools)  - Advised patient to alert all providers of anticoagulation therapy prior to starting a new medication or having a procedure   Follow-up Appointments:  Specialist Hospital f/u appt confirmed? yes Scheduled to see Dr. Gala Romney.   If their condition worsens, is the pt aware to call PCP or go to the Emergency Dept.? yes  Final Patient Assessment: Reviewed medications, dosing and side effects.

## 2021-08-12 ENCOUNTER — Other Ambulatory Visit: Payer: Self-pay

## 2021-08-12 ENCOUNTER — Ambulatory Visit (HOSPITAL_COMMUNITY)
Admission: RE | Admit: 2021-08-12 | Discharge: 2021-08-12 | Disposition: A | Discharge status: Home / Self Care | Payer: 59 | Source: Ambulatory Visit | Attending: Internal Medicine | Admitting: Internal Medicine

## 2021-08-12 ENCOUNTER — Encounter: Payer: Self-pay | Admitting: *Deleted

## 2021-08-12 ENCOUNTER — Other Ambulatory Visit: Payer: Self-pay | Admitting: *Deleted

## 2021-08-12 ENCOUNTER — Ambulatory Visit (HOSPITAL_COMMUNITY): Payer: 59

## 2021-08-12 DIAGNOSIS — R1084 Generalized abdominal pain: Secondary | ICD-10-CM | POA: Diagnosis present

## 2021-08-12 NOTE — Patient Outreach (Signed)
[facility]) Care Management  08/12/2021  Thomas Wood 1996/04/19 [id number]   [facility] outreach for EMMI- stroke  RED ON EMMI ALERT Day #    13       Date: Sunday 08/11/21 1001 Red Alert Reason: Micah Flesher to follow-up appointment? No Scheduled a follow-up appointment? No  Insurance:  [place] [facility])  [facility] admissions x  2 ED visits x 2 in the last 6 months    Outreach attempt #1  Patient is able to verify HIPAA identifiers [facility] Care Management RN reviewed and addressed red alert with patient  Consent: [facility] RN CM reviewed [facility] services with patient. Patient gave verbal consent for services [facility] telephonic RN CM.   Advised patient that there will be further automated EMMI- post discharge calls to assess how the patient is doing following the recent hospitalization Advised the patient that another call may be received from a nurse if any of their responses were abnormal. Patient voiced understanding and was appreciative of f/u call.    EMMI:  Patient denies care coordination needed Appointments: Denies missing or needing assist with appointments Seen by Dr Gala Romney on 08/05/21 To be seen by new pcp, Dr Veto Kemps in January 2023  Has not made an appointment with neurology yet RN CM discussed the importance and purpose of hospital follow up visits 1-2 weeks after discharge with pcp and specialists, offered to assist with appointments after reviewed the discharge instructions appointments advised with him He states he can make the neurological appointment independently  He denies any residual symptoms from stroke He received EMMI education at discharge on ischemic stroke, stroke prevention, eliquis, etc  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 Noelle Penner, RN, BSN, CCM [facility] Telephonic Care Management Care Coordinator Office number [phone] Mobile number [phone]  Main [facility] number [phone] Fax number [phone]

## 2021-08-12 NOTE — Patient Outreach (Signed)
Received a red flag Emmi Stroke notification and assigned Marval Regal, RN for followup.

## 2021-08-14 ENCOUNTER — Encounter (HOSPITAL_COMMUNITY): Payer: Self-pay | Admitting: *Deleted

## 2021-08-14 NOTE — Progress Notes (Signed)
Disability forms completed, signed by Dr Gala Romney and faxed along with recent OV note to [place] at [phone]

## 2021-08-19 ENCOUNTER — Other Ambulatory Visit (HOSPITAL_COMMUNITY): Payer: Self-pay | Admitting: *Deleted

## 2021-08-19 MED ORDER — SPIRONOLACTONE 25 MG PO TABS: 25.0000 mg | ORAL_TABLET | Freq: Every day | ORAL | 3 refills | Status: DC

## 2021-08-20 ENCOUNTER — Ambulatory Visit (HOSPITAL_COMMUNITY)
Admission: RE | Admit: 2021-08-20 | Discharge: 2021-08-20 | Disposition: A | Discharge status: Home / Self Care | Payer: 59 | Source: Ambulatory Visit | Attending: Internal Medicine | Admitting: Internal Medicine

## 2021-08-20 ENCOUNTER — Other Ambulatory Visit: Payer: Self-pay

## 2021-08-20 VITALS — BP 122/82 | HR 75 | Wt 178.2 lb

## 2021-08-20 DIAGNOSIS — Z79899 Other long term (current) drug therapy: Secondary | ICD-10-CM | POA: Insufficient documentation

## 2021-08-20 DIAGNOSIS — I340 Nonrheumatic mitral (valve) insufficiency: Secondary | ICD-10-CM | POA: Diagnosis not present

## 2021-08-20 DIAGNOSIS — Z7984 Long term (current) use of oral hypoglycemic drugs: Secondary | ICD-10-CM | POA: Diagnosis not present

## 2021-08-20 DIAGNOSIS — Z8249 Family history of ischemic heart disease and other diseases of the circulatory system: Secondary | ICD-10-CM | POA: Diagnosis not present

## 2021-08-20 DIAGNOSIS — I493 Ventricular premature depolarization: Secondary | ICD-10-CM | POA: Diagnosis not present

## 2021-08-20 DIAGNOSIS — I5022 Chronic systolic (congestive) heart failure: Secondary | ICD-10-CM | POA: Insufficient documentation

## 2021-08-20 DIAGNOSIS — Z8673 Personal history of transient ischemic attack (TIA), and cerebral infarction without residual deficits: Secondary | ICD-10-CM | POA: Insufficient documentation

## 2021-08-20 DIAGNOSIS — Z6740 Type O blood, Rh positive: Secondary | ICD-10-CM | POA: Insufficient documentation

## 2021-08-20 DIAGNOSIS — I5082 Biventricular heart failure: Secondary | ICD-10-CM | POA: Diagnosis not present

## 2021-08-20 MED ORDER — LOSARTAN POTASSIUM 25 MG PO TABS: 12.5000 mg | ORAL_TABLET | Freq: Every day | ORAL | 3 refills | Status: DC

## 2021-08-20 NOTE — Patient Instructions (Signed)
It was a pleasure seeing you today!  MEDICATIONS: -We are changing your medications today -Start losartan 12.5 mg (1/2 tablet) every evening -Call if you have questions about your medications.  NEXT APPOINTMENT: Return to clinic in 1 month with Dr. Gala Romney.  In general, to take care of your heart failure: -Limit your fluid intake to 2 Liters (half-gallon) per day.   -Limit your salt intake to ideally 2-3 grams (2000-3000 mg) per day. -Weigh yourself daily and record, and bring that "weight diary" to your next appointment.  (Weight gain of 2-3 pounds in 1 day typically means fluid weight.) -The medications for your heart are to help your heart and help you live longer.   -Please contact us before stopping any of your heart medications.  Call the clinic at [phone] with questions or to reschedule future appointments.

## 2021-08-22 ENCOUNTER — Other Ambulatory Visit (HOSPITAL_COMMUNITY): Payer: 59

## 2021-08-22 ENCOUNTER — Encounter (HOSPITAL_COMMUNITY): Payer: 59 | Admitting: Internal Medicine

## 2021-09-04 ENCOUNTER — Other Ambulatory Visit (HOSPITAL_COMMUNITY): Payer: 59

## 2021-09-05 ENCOUNTER — Ambulatory Visit (HOSPITAL_COMMUNITY)
Admission: RE | Admit: 2021-09-05 | Discharge: 2021-09-05 | Disposition: A | Discharge status: Home / Self Care | Payer: 59 | Source: Ambulatory Visit | Attending: Cardiology | Admitting: Cardiology

## 2021-09-05 ENCOUNTER — Other Ambulatory Visit: Payer: Self-pay

## 2021-09-05 DIAGNOSIS — I5022 Chronic systolic (congestive) heart failure: Secondary | ICD-10-CM

## 2021-09-05 LAB — BASIC METABOLIC PANEL
Anion gap: 6 (ref 5–15)
BUN: 17 mg/dL (ref 6–20)
CO2: 26 mmol/L (ref 22–32)
Calcium: 9 mg/dL (ref 8.9–10.3)
Chloride: 105 mmol/L (ref 98–111)
Creatinine, Ser: 1.38 mg/dL — ABNORMAL HIGH (ref 0.61–1.24)
GFR, Estimated: >60 mL/min (ref >60)
Glucose, Bld: 101 mg/dL — ABNORMAL HIGH (ref 70–99)
Potassium: 4.3 mmol/L (ref 3.5–5.1)
Sodium: 137 mmol/L (ref 135–145)

## 2021-09-10 ENCOUNTER — Other Ambulatory Visit (HOSPITAL_COMMUNITY): Payer: Self-pay | Admitting: *Deleted

## 2021-09-10 MED ORDER — MEXILETINE HCL 150 MG PO CAPS: 300.0000 mg | ORAL_CAPSULE | Freq: Two times a day (BID) | ORAL | 1 refills | Status: DC

## 2021-09-17 ENCOUNTER — Other Ambulatory Visit: Payer: Self-pay | Admitting: Internal Medicine

## 2021-09-18 ENCOUNTER — Ambulatory Visit (HOSPITAL_COMMUNITY)
Admission: RE | Admit: 2021-09-18 | Discharge: 2021-09-18 | Disposition: A | Discharge status: Home / Self Care | Payer: 59 | Source: Ambulatory Visit | Attending: Internal Medicine | Admitting: Internal Medicine

## 2021-09-18 ENCOUNTER — Ambulatory Visit (HOSPITAL_BASED_OUTPATIENT_CLINIC_OR_DEPARTMENT_OTHER)
Admission: RE | Admit: 2021-09-18 | Discharge: 2021-09-18 | Disposition: A | Discharge status: Home / Self Care | Payer: 59 | Source: Ambulatory Visit | Attending: Family Medicine | Admitting: Family Medicine

## 2021-09-18 ENCOUNTER — Other Ambulatory Visit (HOSPITAL_COMMUNITY): Payer: Self-pay | Admitting: Internal Medicine

## 2021-09-18 ENCOUNTER — Other Ambulatory Visit: Payer: Self-pay

## 2021-09-18 ENCOUNTER — Encounter (HOSPITAL_COMMUNITY): Payer: Self-pay | Admitting: Internal Medicine

## 2021-09-18 VITALS — BP 110/70 | HR 61 | Wt 183.2 lb

## 2021-09-18 DIAGNOSIS — Z8673 Personal history of transient ischemic attack (TIA), and cerebral infarction without residual deficits: Secondary | ICD-10-CM | POA: Insufficient documentation

## 2021-09-18 DIAGNOSIS — Z7984 Long term (current) use of oral hypoglycemic drugs: Secondary | ICD-10-CM | POA: Insufficient documentation

## 2021-09-18 DIAGNOSIS — I493 Ventricular premature depolarization: Secondary | ICD-10-CM

## 2021-09-18 DIAGNOSIS — Z8249 Family history of ischemic heart disease and other diseases of the circulatory system: Secondary | ICD-10-CM | POA: Diagnosis not present

## 2021-09-18 DIAGNOSIS — I5082 Biventricular heart failure: Secondary | ICD-10-CM

## 2021-09-18 DIAGNOSIS — I5022 Chronic systolic (congestive) heart failure: Secondary | ICD-10-CM

## 2021-09-18 DIAGNOSIS — Z6740 Type O blood, Rh positive: Secondary | ICD-10-CM | POA: Insufficient documentation

## 2021-09-18 DIAGNOSIS — I340 Nonrheumatic mitral (valve) insufficiency: Secondary | ICD-10-CM | POA: Diagnosis not present

## 2021-09-18 DIAGNOSIS — Z7901 Long term (current) use of anticoagulants: Secondary | ICD-10-CM | POA: Insufficient documentation

## 2021-09-18 DIAGNOSIS — Z8674 Personal history of sudden cardiac arrest: Secondary | ICD-10-CM | POA: Insufficient documentation

## 2021-09-18 DIAGNOSIS — Z79899 Other long term (current) drug therapy: Secondary | ICD-10-CM | POA: Insufficient documentation

## 2021-09-18 DIAGNOSIS — I444 Left anterior fascicular block: Secondary | ICD-10-CM | POA: Diagnosis not present

## 2021-09-18 DIAGNOSIS — I509 Heart failure, unspecified: Secondary | ICD-10-CM

## 2021-09-18 LAB — MAGNESIUM: Magnesium: 2.1 mg/dL (ref 1.7–2.4)

## 2021-09-18 LAB — BASIC METABOLIC PANEL
Anion gap: 11 (ref 5–15)
BUN: 22 mg/dL — ABNORMAL HIGH (ref 6–20)
CO2: 21 mmol/L — ABNORMAL LOW (ref 22–32)
Calcium: 9.6 mg/dL (ref 8.9–10.3)
Chloride: 106 mmol/L (ref 98–111)
Creatinine, Ser: 1.39 mg/dL — ABNORMAL HIGH (ref 0.61–1.24)
GFR, Estimated: >60 mL/min (ref >60)
Glucose, Bld: 98 mg/dL (ref 70–99)
Potassium: 4.5 mmol/L (ref 3.5–5.1)
Sodium: 138 mmol/L (ref 135–145)

## 2021-09-18 LAB — ECHOCARDIOGRAM COMPLETE
Area-P 1/2: 3.6 cm2
Calc EF: 32.7 %
MV M vel: 4.32 m/s
MV Peak grad: 74.6 mmHg
Radius: 0.4 cm
S' Lateral: 6.8 cm
Single Plane A2C EF: 34.7 %
Single Plane A4C EF: 30.5 %

## 2021-09-18 LAB — BRAIN NATRIURETIC PEPTIDE: B Natriuretic Peptide: 709.8 pg/mL — ABNORMAL HIGH (ref 0.0–100.0)

## 2021-09-18 MED ORDER — LOSARTAN POTASSIUM 25 MG PO TABS: 12.5000 mg | ORAL_TABLET | Freq: Two times a day (BID) | ORAL | 3 refills | Status: DC

## 2021-09-18 NOTE — Patient Instructions (Signed)
Increase Losartan to 12.5 mg (1/2 tab) Twice daily   Labs done today, your results will be available in MyChart, we will contact you for abnormal readings.  Your provider has recommended that  you wear a Zio Patch for 14 days.  This monitor will record your heart rhythm for our review.  IF you have any symptoms while wearing the monitor please press the button.  If you have any issues with the patch or you notice a red or orange light on it please call the company at [phone].  Once you remove the patch please mail it back to the company as soon as possible so we can get the results.  Your physician has recommended that you have a cardiopulmonary stress test (CPX). CPX testing is a non-invasive measurement of heart and lung function. It replaces a traditional treadmill stress test. This type of test provides a tremendous amount of information that relates not only to your present condition but also for future outcomes. This test combines measurements of you ventilation, respiratory gas exchange in the lungs, electrocardiogram (EKG), blood pressure and physical response before, during, and following an exercise protocol.  You have been referred to Dr Jomarie Longs for genetic counseling, her office will call you for an appointment  Your physician recommends that you schedule a follow-up appointment in: 1 month  If you have any questions or concerns before your next appointment please send [place] a message through [place] or call our office at [phone].    TO LEAVE A MESSAGE FOR THE NURSE SELECT OPTION 2, PLEASE LEAVE A MESSAGE INCLUDING: YOUR NAME DATE OF BIRTH CALL BACK NUMBER REASON FOR CALL**this is important as we prioritize the call backs  YOU WILL RECEIVE A CALL BACK THE SAME DAY AS LONG AS YOU CALL BEFORE 4:00 PM  At the [facility], you and your health needs are our priority. As part of our continuing mission to provide you with exceptional heart care, we have created  designated Provider Care Teams. These Care Teams include your primary Cardiologist (physician) and Advanced Practice Providers (APPs- Physician Assistants and Nurse Practitioners) who all work together to provide you with the care you need, when you need it.   You may see any of the following providers on your designated Care Team at your next follow up: Dr Arvilla Meres Dr Carron Curie, NP Robbie Lis, [place] [facility] [place], [place] Karle Plumber, PharmD   Please be sure to bring in all your medications bottles to every appointment.

## 2021-09-18 NOTE — Progress Notes (Signed)
°  Echocardiogram 2D Echocardiogram has been performed.  Thomas Wood 09/18/2021, 3:13 PM

## 2021-09-18 NOTE — Progress Notes (Signed)
[facility] CLINIC NOTE  [id number], [name], MD Primary Cardiologist: DB  HPI:  Thomas Wood is a 26 y/o male with h/o morbid obesity s/p marked weight loss admitted in 11/22 with acute systolic HF EF [id number] with LVIDd > 7cm. Father (Thomas Wood) with h/o cardiac arrest/HF due to NICM and has undergone VAD placement (here) and OHTx at [facility].    R/LHC showed normal coronary arteries and low output, CI 1.7. Complicated by severe vagal event and near arrest in the cath lab, resuscitated with epinephrine, NE and IVF. Transferred to ICU. Able to wean off NE. GDMT introduced. cMRI showed severe biventricular dysfunction w/ severe LV dilation and globally elevated ECV signal, w/ myocardial thinning and scar c/w a chronic process. LVEF 9%. RVEF 2% No LV thrombus.    Had high PVC burden  >20%. Started on mexiletine with dose increased to 300 mg twice a day . PVC burden reduced. Discharged home w/ LifeVest.   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 Treated with t-PA with complete resolution of symptoms. Cerebral arteriogram with no clot. Repeat echo EF <20% RV moderately down. No frank LV clot. Apixaban started. Had recurrent PVCs so amio added to mexilitene for PVC suppression. BP low so midodrine added and losartan held    Here for f/u. Feels good. Seen in [facility] and losartan 12.5 started. BP 110/75 usually. Sometimes dizzy in middle of day. Lowest BP 101/70. Not using lasix. No orthopnea or PND. Walks the dog but not too far. Walks through [employer] store without problem. No firing on LifeVest.   Echo today 09/18/21: EF 15% RV moderately down. Severe MR Personally reviewed   ROS: All systems negative except as listed in HPI, PMH and Problem List.  SH:  Social History   Socioeconomic History   Marital status: Single    Spouse name: Not on file   Number of children: Not on file   Years of education: Not on file   Highest education level: Not on file  Occupational History   Not on  file  Tobacco Use   Smoking status: Never   Smokeless tobacco: Never  Substance and Sexual Activity   Alcohol use: No   Drug use: No   Sexual activity: Yes    Birth control/protection: Pill  Other Topics Concern   Not on file  Social History Narrative   Not on file   Social Determinants of Health   Financial Resource Strain: Low Risk    Difficulty of Paying Living Expenses: Not hard at all  Food Insecurity: No Food Insecurity   Worried About Charity fundraiser in the Last Year: Never true   [place] in the Last Year: Never true  Transportation Needs: No Transportation Needs   Lack of Transportation (Medical): No   Lack of Transportation (Non-Medical): No  Physical Activity: Not on file  Stress: No Stress Concern Present   Feeling of Stress : Not at all  Social Connections: Moderately Integrated   Frequency of Communication with Friends and Family: Three times a week   Frequency of Social Gatherings with Friends and Family: Three times a week   Attends Religious Services: 1 to 4 times per year   Active Member of Clubs or Organizations: Yes   Attends Archivist Meetings: 1 to 4 times per year   Marital Status: Never married  Intimate Partner Violence: Not At Risk   Fear of Current or Ex-Partner: No   Emotionally Abused: No  Physically Abused: No   Sexually Abused: No    FH:  Family History  Problem Relation Age of Onset   Asthma Mother    Cancer Mother        breast   Miscarriages / Stillbirths Mother    Kidney disease Mother        stones   Diabetes Father    Heart disease Father 32        MI   Heart failure Father        LVAD>>Heart Transplant (35s)   Asthma Brother     Past Medical History:  Diagnosis Date   Asthma    Cerebrovascular accident (CVA) due to thrombosis of basilar artery ([place]) 07/24/2021   Chronic kidney disease    stones   GERD (gastroesophageal reflux disease)    New onset of congestive heart failure ([place]) 07/11/2021     Current Outpatient Medications  Medication Sig Dispense Refill   albuterol (VENTOLIN HFA) 108 (90 Base) MCG/ACT inhaler Inhale 2 puffs into the lungs every 6 (six) hours as needed for wheezing.     amiodarone (PACERONE) 200 MG tablet Take 200 mg by mouth daily.     apixaban (ELIQUIS) 5 MG TABS tablet Take 1 tablet (5 mg total) by mouth 2 (two) times daily. 60 tablet 1   atorvastatin (LIPITOR) 40 MG tablet Take 1 tablet (40 mg total) by mouth daily. 30 tablet 1   digoxin (LANOXIN) 0.125 MG tablet Take 1 tablet (0.125 mg total) by mouth daily. 30 tablet 6   empagliflozin (JARDIANCE) 10 MG TABS tablet Take 1 tablet (10 mg total) by mouth daily. 30 tablet 6   losartan (COZAAR) 25 MG tablet Take 0.5 tablets (12.5 mg total) by mouth daily. 45 tablet 3   mexiletine (MEXITIL) 150 MG capsule Take 2 capsules (300 mg total) by mouth every 12 (twelve) hours. 60 capsule 1   spironolactone (ALDACTONE) 25 MG tablet Take 1 tablet (25 mg total) by mouth daily. 30 tablet 3   No current facility-administered medications for this encounter.    Vitals:   09/18/21 1521  BP: 110/70  Pulse: 61  SpO2: 100%  Weight: 83.1 kg (183 lb 3.2 oz)    PHYSICAL EXAM:  General:  Well appearing. No resp difficulty HEENT: normal Neck: supple. no JVD. Carotids 2+ bilat; no bruits. No lymphadenopathy or thryomegaly appreciated. Cor: PMI nondisplaced. Regular rate & rhythm. With ectopy No rubs, gallops or murmurs. Lungs: clear Abdomen: soft, nontender, nondistended. No hepatosplenomegaly. No bruits or masses. Good bowel sounds. Extremities: no cyanosis, clubbing, rash, edema Neuro: alert & orientedx3, cranial nerves grossly intact. moves all 4 extremities w/o difficulty. Affect pleasant    ECG: NSR 72 QRS 118ms  Frequent unifocal PVCs Personally reviewed  LifeVest interrogation: No VT  ASSESSMENT & PLAN:  1. 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 Cath complicated by severe vagal episode with near loss of pulse. - cMRI 11/22 - severe biventricular dysfunciton myocardial thinning and scar. LVEF 9% RVEF 2% - Concern for familial CM. Possible contribution from frequent PVCs, Father had CM in 86s and had LVAD as bridge to eventual transplant  - Echo today 09/18/21: EF 15% RV moderately down. Severe MR Personally reviewed - Stable NYHA II - Continue dig 0.125 - Continue Jardiance 10 - Continue spiro 25 daily  - Increase losartan to  12.5mg  bid - Not on b-blocker yet with recent low output - Blood Type O-pos - Continue LifeVest - EF not improving with attempts at PVC suppression and slow titration of GDMT as tolerated with his low BP. Based on family history, echo and MRI images, I worry he will not recover well. Will get CPX testing to start w/u for advanced therapies. He has referral for genetic testing   2. Severe MR -  functional due to severely dilated LV    3. Frequent PVCs - TSH normal  - persist despite amio and mexilitene - outpatient sleep study to r/o OSA  - place Zio to quantify  Glori Bickers, MD  3:39 PM

## 2021-09-27 ENCOUNTER — Other Ambulatory Visit: Payer: Self-pay

## 2021-09-30 ENCOUNTER — Other Ambulatory Visit: Payer: Self-pay

## 2021-09-30 ENCOUNTER — Encounter: Payer: Self-pay | Admitting: Family Medicine

## 2021-09-30 ENCOUNTER — Ambulatory Visit: Payer: 59 | Admitting: Family Medicine

## 2021-09-30 VITALS — BP 120/78 | HR 74 | Temp 96.8°F | Ht 71.0 in | Wt 190.4 lb

## 2021-09-30 DIAGNOSIS — J4520 Mild intermittent asthma, uncomplicated: Secondary | ICD-10-CM | POA: Diagnosis not present

## 2021-09-30 DIAGNOSIS — Z23 Encounter for immunization: Secondary | ICD-10-CM | POA: Diagnosis not present

## 2021-09-30 DIAGNOSIS — I5022 Chronic systolic (congestive) heart failure: Secondary | ICD-10-CM | POA: Diagnosis not present

## 2021-09-30 DIAGNOSIS — Z8673 Personal history of transient ischemic attack (TIA), and cerebral infarction without residual deficits: Secondary | ICD-10-CM

## 2021-09-30 NOTE — Progress Notes (Signed)
[facility] [address] [place] [address] Dept: [phone] Dept Fax: [phone]  New Patient Office Visit  Subjective:    Patient ID: Thomas Wood, male    DOB: 1995/11/24, 26 y.o..   MRN: [id number]  Chief Complaint  Patient presents with   Establish Care    NP- establish care.  No concerns.      History of Present Illness:  Patient is in today to establish care. Thomas Wood was born in [place], [place]. He attended [employer]. He did classes at [employer] for about 2 years, working on a psychology degree, but stopped when the pandemic hit. 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 However, he has been out of work this past year related to his heart condition. He has a fianc. They have been in a committed relationship for 6 years. He quit the use of tobacco 6 years ago. He denies any alcohol or drug use.  Thomas Wood has a past history of morbid obesity. He was at 285 lbs near the end of high school.  Thomas Wood was admitted at [facility] in Nov. 2022 with acute systolic HF with an EF of 10-15%. He had a dilated cardiomyopathy. His father had a history of early coronary artery disease and has had a heart transplant. Thomas Wood had a near arrest while undergoing cardiac catheterization. He was noted to have a >20% PVC burden, so was started on mexilitene and was discharged with a LifeVest. He was readmitted later in Nov. with left facial numbness and diplopia secondary to an acute basilar artery CVA. This was treated with tPA with complete resolution. He is now on Eliquis. As he had persistent PVCs, amiodarone was added to his mexilitene. He is also now managed on atorvastatin, digoxin, empagliflozin, losartan, and spironolactone. He has a Zio patch on for monitoring of his PVCs. He is scheduled in the near future for an exercise treadmill test.  Thomas Wood has a history of mild  intermittent asthma. He has rarely needed albuterol to [facility] this.  Past Medical History: Patient Active Problem List   Diagnosis Date Noted   History of basilar artery thrombosis 0000000   Chronic systolic heart failure ([place]) 07/11/2021   Low HDL (under 40) 09/18/2017   Environmental allergies 08/11/2016   History of kidney stones 08/11/2016   Past Surgical History:  Procedure Laterality Date   ARTERIAL LINE INSERTION N/A 07/12/2021   Procedure: ARTERIAL LINE INSERTION;  Surgeon: Jolaine Artist, MD;  Location: [place] [facility];  Service: Cardiovascular;  Laterality: N/A;   CENTRAL LINE INSERTION  07/12/2021   Procedure: CENTRAL LINE INSERTION;  Surgeon: Jolaine Artist, MD;  Location: [place] [facility];  Service: Cardiovascular;;   IR ANGIO INTRA EXTRACRAN SEL COM CAROTID INNOMINATE BILAT MOD SED  07/24/2021   IR ANGIO VERTEBRAL SEL VERTEBRAL BILAT MOD SED  07/24/2021   RADIOLOGY WITH ANESTHESIA N/A 07/24/2021   Procedure: RADIOLOGY WITH ANESTHESIA;  Surgeon: Luanne Bras, MD;  Location: [place];  Service: Radiology;  Laterality: N/A;   RIGHT/LEFT HEART CATH AND CORONARY ANGIOGRAPHY N/A 07/12/2021   Procedure: RIGHT/LEFT HEART CATH AND CORONARY ANGIOGRAPHY;  Surgeon: Jolaine Artist, MD;  Location: [place] [facility];  Service: Cardiovascular;  Laterality: N/A;   Family History  Problem Relation Age of Onset   Diabetes Mother    Asthma Mother    Cancer Mother  breast   Miscarriages / Stillbirths Mother    Kidney disease Mother        stones   Heart disease Father 51       MI, heart transplant   Diabetes Father    Heart failure Father        LVAD>>Heart Transplant (40s)   Asthma Brother    Diabetes Maternal Grandmother    Cancer Maternal Grandmother        Breast   Kidney disease Maternal Grandmother    Stroke Maternal Grandmother    Diabetes Paternal Grandmother    Cancer Paternal Grandfather    Outpatient Medications Prior to Visit   Medication Sig Dispense Refill   albuterol (VENTOLIN HFA) 108 (90 Base) MCG/ACT inhaler Inhale 2 puffs into the lungs every 6 (six) hours as needed for wheezing.     amiodarone (PACERONE) 200 MG tablet Take 200 mg by mouth daily.     apixaban (ELIQUIS) 5 MG TABS tablet Take 1 tablet (5 mg total) by mouth 2 (two) times daily. 60 tablet 1   atorvastatin (LIPITOR) 40 MG tablet Take 1 tablet (40 mg total) by mouth daily. 30 tablet 1   digoxin (LANOXIN) 0.125 MG tablet Take 1 tablet (0.125 mg total) by mouth daily. 30 tablet 6   empagliflozin (JARDIANCE) 10 MG TABS tablet Take 1 tablet (10 mg total) by mouth daily. 30 tablet 6   losartan (COZAAR) 25 MG tablet Take 0.5 tablets (12.5 mg total) by mouth in the morning and at bedtime. 30 tablet 3   mexiletine (MEXITIL) 150 MG capsule Take 2 capsules (300 mg total) by mouth every 12 (twelve) hours. 60 capsule 1   spironolactone (ALDACTONE) 25 MG tablet Take 1 tablet (25 mg total) by mouth daily. 30 tablet 3   No facility-administered medications prior to visit.   No Known Allergies    Objective:   Today's Vitals   09/30/21 1047  BP: 120/78  Pulse: 74  Temp: (!) 96.8 F (36 C)  TempSrc: Temporal  SpO2: 98%  Weight: 190 lb 6.4 oz (86.4 kg)  Height: 5\' 11"  (1.803 m)   Body mass index is 26.56 kg/m.   General: Well developed, well nourished. No acute distress. Lungs: Clear to auscultation bilaterally. No wheezing, rales or rhonchi. CV: RRR without murmurs or rubs, but frequent premature beats. Pulses 2+ bilaterally. Psych: Alert and oriented. Normal mood and affect.  Health Maintenance Due  Topic Date Due   TETANUS/TDAP  05/23/2018   COVID-19 Vaccine (3 - Booster for Pfizer series) 01/25/2020   Imaging: Echocardiogram (09/18/2021) IMPRESSIONS   1. Left ventricular ejection fraction, by estimation, is <20%. The left ventricle has severely decreased function. The left ventricle demonstrates global hypokinesis. The left ventricular  internal cavity size was severely dilated. Left ventricular diastolic parameters are consistent with Grade II diastolic dysfunction (pseudonormalization). The average left ventricular global longitudinal strain is -12.2 %. The global longitudinal strain is abnormal.   2. Right ventricular systolic function is moderately reduced. The right ventricular size is normal. There is mildly elevated pulmonary artery systolic pressure.   3. Left atrial size was severely dilated.   4. Tricuspid valve regurgitation is mild to moderate.   5. The mitral valve is abnormal. Moderate mitral valve regurgitation visually by colorflow but mild by PISA assessment. No evidence of mitral stenosis. PISA ERO 0.09cm2, MR volume 12cc and MR radius 0.4cm.   6. The aortic valve was not well visualized. Aortic valve regurgitation is not visualized. Aortic valve  sclerosis is present, with no evidence of aortic valve stenosis.   7. The inferior vena cava is normal in size with greater than 50% respiratory variability, suggesting right atrial pressure of 3 mmHg.   8. Side by side images compared to study 07/25/2021. LVF remains severely reduced with no significant improvement in EF.   Lab Results Last CBC Lab Results  Component Value Date   WBC 8.2 07/28/2021   HGB 14.0 07/28/2021   HCT 42.2 07/28/2021   MCV 88.7 07/28/2021   MCH 29.4 07/28/2021   RDW 12.5 07/28/2021   PLT 300 [id number]   Last metabolic panel Lab Results  Component Value Date   GLUCOSE 98 09/18/2021   NA 138 09/18/2021   K 4.5 09/18/2021   CL 106 09/18/2021   CO2 21 (L) 09/18/2021   BUN 22 (H) 09/18/2021   CREATININE 1.39 (H) 09/18/2021   GFRNONAA >60 09/18/2021   CALCIUM 9.6 09/18/2021   PHOS 3.7 07/28/2021   PROT 6.3 (L) 07/25/2021   ALBUMIN 3.4 (L) 07/25/2021   BILITOT 2.2 (H) 07/25/2021   ALKPHOS 48 07/25/2021   AST 57 (H) 07/25/2021   ALT 30 07/25/2021   ANIONGAP 11 09/18/2021   Last lipids Lab Results  Component Value Date   CHOL  159 07/24/2021   HDL 35 (L) 07/24/2021   LDLCALC 114 (H) 07/24/2021   TRIG 50 07/24/2021   CHOLHDL 4.5 07/24/2021   Last hemoglobin A1c Lab Results  Component Value Date   HGBA1C 4.9 07/24/2021   Assessment & Plan:   1. Chronic systolic heart failure (HCC) Severely reduced EF. Mr. Younkin will continue to follow with Dr. Haroldine Laws. He is currently on amiodarone and mexilitene to regulate frequent PVCs. He is on atorvastatin, digoxin, empagliflozin, losartan and spironolactone related to his heart failure. He has had some symptomatic improvement since being on these meds. I wonder if he may end up needing a heart transplant in the future.  2. History of basilar artery thrombosis Continue Eliquis s/p basilar CVA.  3. Need for Td vaccine  - Td : Tetanus/diphtheria >7yo Preservative  free  Haydee Salter, MD

## 2021-10-02 ENCOUNTER — Encounter (HOSPITAL_COMMUNITY): Payer: Self-pay | Admitting: Internal Medicine

## 2021-10-07 ENCOUNTER — Other Ambulatory Visit (HOSPITAL_COMMUNITY): Payer: Self-pay | Admitting: *Deleted

## 2021-10-07 ENCOUNTER — Telehealth (HOSPITAL_COMMUNITY): Payer: Self-pay | Admitting: *Deleted

## 2021-10-07 MED ORDER — MEXILETINE HCL 150 MG PO CAPS: 300.0000 mg | ORAL_CAPSULE | Freq: Two times a day (BID) | ORAL | 3 refills | Status: DC

## 2021-10-07 MED ORDER — APIXABAN 5 MG PO TABS: 5.0000 mg | ORAL_TABLET | Freq: Two times a day (BID) | ORAL | 3 refills | Status: DC

## 2021-10-07 MED ORDER — ATORVASTATIN CALCIUM 40 MG PO TABS: 40.0000 mg | ORAL_TABLET | Freq: Every day | ORAL | 3 refills | Status: DC

## 2021-10-07 NOTE — Telephone Encounter (Signed)
Pt sent a msg via mychart 2/1 about disability paperwork. 2nd message received today (see below).   Routed to [employer] "[name]"  P [facility] (supporting Bensimhon, Bevelyn Buckles, MD) 11 hours ago (9:35 PM)    Hello, this is just a follow up from a question I had last week about my disability papers.    You routed conversation to [employer], Geroge Baseman, RN 3 days ago   Georgie Chard "[name]"  P [facility] Clinical Pool (supporting Bensimhon, Bevelyn Buckles, MD) 5 days ago    Hello, just wanted to reach out and say that my disability claims manager asked if they could get updated information on my condition from my health care provider. They told me I had until 10/08/21 to send that information over. I'm not sure if they recieved the notes from my visit on the 18th but I think that's what they're asking for. Thank you.

## 2021-10-08 ENCOUNTER — Other Ambulatory Visit: Payer: Self-pay

## 2021-10-08 ENCOUNTER — Ambulatory Visit (HOSPITAL_COMMUNITY): Payer: 59 | Attending: Cardiology

## 2021-10-08 ENCOUNTER — Other Ambulatory Visit (HOSPITAL_COMMUNITY): Payer: Self-pay | Admitting: *Deleted

## 2021-10-08 DIAGNOSIS — I5022 Chronic systolic (congestive) heart failure: Secondary | ICD-10-CM

## 2021-10-08 NOTE — Telephone Encounter (Signed)
October 07, 2021 Me to Thomas Wood "[name]"      4:09 PM Hey, the forms have been completed and faxed along your office notes from 1/18 to [employer]. Thanks

## 2021-10-08 NOTE — Addendum Note (Signed)
Encounter addended by: Crissie Figures, RN on: 10/08/2021 10:56 AM  Actions taken: Imaging Exam ended

## 2021-10-13 NOTE — Progress Notes (Signed)
[facility] CLINIC NOTE  ZDG:LOVF, Bertram Millard, MD Primary Cardiologist: DB  HPI:  [name] is a 26 y/o male with h/o morbid obesity s/p marked weight loss admitted in 11/22 with acute systolic HF EF 10-15% with LVIDd > 7cm. Father (Donal Lorrin Goodell) with h/o cardiac arrest/HF due to NICM and has undergone VAD placement (here) and OHTx at [facility].    R/LHC normal coronary arteries and low output, CI 1.7. Complicated by severe vagal event and near arrest in the cath lab, resuscitated with epinephrine, NE and IVF. cMRI showed severe biventricular dysfunction w/ severe LV dilation and globally elevated ECV signal, w/ myocardial thinning and scar c/w a chronic process. LVEF 9%. RVEF 2% No LV thrombus.    Had high PVC burden  >20%. Started on mexiletine with dose increased to 300 mg twice a day . PVC burden reduced. Discharged home w/ LifeVest.   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 Treated with t-PA with complete resolution of symptoms. Cerebral arteriogram with no clot. Repeat echo EF <20% RV moderately down. No frank LV clot. Apixaban started. Had recurrent PVCs so amio added to mexilitene for PVC suppression. BP low so midodrine added and losartan held    Here for f/u. Feels ok. Able to do all ADLs without a problem. Can go to store and walk whole store without stopping. Denies edema, orthopnea or PND. Recent CPX with severe HF/ventilatory limitation. Continues to wear lifevest. No firings. SBP 108-115   Echo 09/18/21: EF 15% RV moderately down. Severe MR Personally reviewed Zio 1/23: 11.4% PVCs  CPX 2/23 FVC 2.01 (41%)      FEV1 1.68 (41%)        MVV 72 (38%)       Resting HR: 80 Standing HR: 83 Peak HR: 148   (76% age predicted max HR)  BP rest: 108/68 Standing BP: 100/62 BP peak: 118/64  Peak VO2: 17.3 (37% predicted peak VO2)  VE/VCO2 slope:  40  Peak RER: 1.15  VE/MVV:  98%  O2pulse:  10   (48% predicted O2pulse)    ROS: All systems negative except as listed in HPI, PMH and Problem  List.  SH:  Social History   Socioeconomic History   Marital status: Significant Other    Spouse name: Not on file   Number of children: Not on file   Years of education: Not on file   Highest education level: Not on file  Occupational History   Not on file  Tobacco Use   Smoking status: Former    Types: Cigarettes    Quit date: 2018    Years since quitting: 5.1   Smokeless tobacco: Never  Vaping Use   Vaping Use: Never used  Substance and Sexual Activity   Alcohol use: No   Drug use: No   Sexual activity: Yes    Birth control/protection: Pill  Other Topics Concern   Not on file  Social History Narrative   Not on file   Social Determinants of Health   Financial Resource Strain: Low Risk    Difficulty of Paying Living Expenses: Not hard at all  Food Insecurity: No Food Insecurity   Worried About Programme researcher, broadcasting/film/video in the Last Year: Never true   Ran Out of Food in the Last Year: Never true  Transportation Needs: No Transportation Needs   Lack of Transportation (Medical): No   Lack of Transportation (Non-Medical): No  Physical Activity: Not on file  Stress: No Stress Concern Present  Feeling of Stress : Not at all  Social Connections: Moderately Integrated   Frequency of Communication with Friends and Family: Three times a week   Frequency of Social Gatherings with Friends and Family: Three times a week   Attends Religious Services: 1 to 4 times per year   Active Member of Clubs or Organizations: Yes   Attends Archivist Meetings: 1 to 4 times per year   Marital Status: Never married  Human resources officer Violence: Not At Risk   Fear of Current or Ex-Partner: No   Emotionally Abused: No   Physically Abused: No   Sexually Abused: No    FH:  Family History  Problem Relation Age of Onset   Diabetes Mother    Asthma Mother    Cancer Mother        breast   Miscarriages / Stillbirths Mother    Kidney disease Mother        stones   Heart disease  Father 32       MI, heart transplant   Diabetes Father    Heart failure Father        LVAD>>Heart Transplant (50s)   Asthma Brother    Diabetes Maternal Grandmother    Cancer Maternal Grandmother        Breast   Kidney disease Maternal Grandmother    Stroke Maternal Grandmother    Diabetes Paternal Grandmother    Cancer Paternal Grandfather     Past Medical History:  Diagnosis Date   Asthma    Cerebrovascular accident (CVA) due to thrombosis of basilar artery ([place]) 07/24/2021   Chronic kidney disease    stones   GERD (gastroesophageal reflux disease)    New onset of congestive heart failure ([place]) 07/11/2021    Current Outpatient Medications  Medication Sig Dispense Refill   albuterol (VENTOLIN HFA) 108 (90 Base) MCG/ACT inhaler Inhale 2 puffs into the lungs every 6 (six) hours as needed for wheezing.     amiodarone (PACERONE) 200 MG tablet Take 200 mg by mouth daily.     apixaban (ELIQUIS) 5 MG TABS tablet Take 1 tablet (5 mg total) by mouth 2 (two) times daily. 60 tablet 3   atorvastatin (LIPITOR) 40 MG tablet Take 1 tablet (40 mg total) by mouth daily. 30 tablet 3   digoxin (LANOXIN) 0.125 MG tablet Take 1 tablet (0.125 mg total) by mouth daily. 30 tablet 6   empagliflozin (JARDIANCE) 10 MG TABS tablet Take 1 tablet (10 mg total) by mouth daily. 30 tablet 6   losartan (COZAAR) 25 MG tablet Take 0.5 tablets (12.5 mg total) by mouth in the morning and at bedtime. 30 tablet 3   mexiletine (MEXITIL) 150 MG capsule Take 2 capsules (300 mg total) by mouth every 12 (twelve) hours. 60 capsule 3   spironolactone (ALDACTONE) 25 MG tablet Take 1 tablet (25 mg total) by mouth daily. 30 tablet 3   No current facility-administered medications for this encounter.    Vitals:   10/14/21 1501  BP: 110/78  Pulse: 65  SpO2: 100%  Weight: 85.8 kg (189 lb 3.2 oz)     PHYSICAL EXAM: General:  Well appearing. No resp difficulty HEENT: normal Neck: supple. no JVD. Carotids 2+ bilat; no  bruits. No lymphadenopathy or thryomegaly appreciated. Cor: PMI nondisplaced. Regular rate & rhythm. No rubs, gallops or murmurs.+ lifevest Lungs: clear Abdomen: soft, nontender, nondistended. No hepatosplenomegaly. No bruits or masses. Good bowel sounds. Extremities: no cyanosis, clubbing, rash, edema Neuro: alert & orientedx3,  cranial nerves grossly intact. moves all 4 extremities w/o difficulty. Affect pleasant   ECG: NSR 67 QRS 102 ms No PVCs Personally reviewed   LifeVest interrogation: No VT  ASSESSMENT & PLAN:  1. 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 Cath complicated by severe vagal episode with near loss of pulse. - cMRI 11/22 - severe biventricular dysfunciton myocardial thinning and scar. LVEF 9% RVEF 2% - Concern for familial CM. Possible contribution from frequent PVCs, Father had CM in 23s and had LVAD as bridge to eventual transplant  - Echo 09/18/21: EF 15% RV moderately down. Severe MR  - CPX 2/23 Severe restrictive lung disease with severe HF limitation. pVO2 17.3 (37%) slope 40 RER: 1.15  VE/MVV:  98%  - Stable NYHA II-early III - Continue dig 0.125 - Continue Jardiance 10 - Continue spiro 25 daily  - Increase losartan to 25 mg bid - Not on b-blocker yet with recent low output - Blood Type O-pos - Offered ICD but he wants to continue LifeVest will need to decide on ICD soon.  - EF not improving with attempts at PVC suppression and slow titration of GDMT as tolerated with his low BP. Based on family history, echo and MRI images, I worry he will not recover well. CPX confirms severe HF limitation. Refer to [facility] to begin transplant w/u. Has referral for genetic w/u (scheduled for May)   2. Severe MR -  functional due to severely dilated LV    3. Frequent PVCs - TSH normal  - persist despite amio and  mexilitene - outpatient sleep study to r/o OSA  - Zio 1/23: 11.4% PVCs  4. H/o CVA  - cardio-embolic in [id number]. Treated t-PA - continue apixaban/statin   Glori Bickers, MD  3:27 PM

## 2021-10-14 ENCOUNTER — Other Ambulatory Visit: Payer: Self-pay

## 2021-10-14 ENCOUNTER — Encounter (HOSPITAL_COMMUNITY): Payer: Self-pay | Admitting: Internal Medicine

## 2021-10-14 ENCOUNTER — Ambulatory Visit (HOSPITAL_COMMUNITY)
Admission: RE | Admit: 2021-10-14 | Discharge: 2021-10-14 | Disposition: A | Discharge status: Home / Self Care | Payer: 59 | Source: Ambulatory Visit | Attending: Internal Medicine | Admitting: Internal Medicine

## 2021-10-14 VITALS — BP 110/78 | HR 65 | Wt 189.2 lb

## 2021-10-14 DIAGNOSIS — Z8673 Personal history of transient ischemic attack (TIA), and cerebral infarction without residual deficits: Secondary | ICD-10-CM | POA: Diagnosis not present

## 2021-10-14 DIAGNOSIS — I493 Ventricular premature depolarization: Secondary | ICD-10-CM | POA: Insufficient documentation

## 2021-10-14 DIAGNOSIS — I5082 Biventricular heart failure: Secondary | ICD-10-CM | POA: Diagnosis not present

## 2021-10-14 DIAGNOSIS — I5022 Chronic systolic (congestive) heart failure: Secondary | ICD-10-CM | POA: Diagnosis present

## 2021-10-14 DIAGNOSIS — Z7901 Long term (current) use of anticoagulants: Secondary | ICD-10-CM | POA: Diagnosis not present

## 2021-10-14 DIAGNOSIS — Z79899 Other long term (current) drug therapy: Secondary | ICD-10-CM | POA: Diagnosis not present

## 2021-10-14 LAB — BASIC METABOLIC PANEL
Anion gap: 10 (ref 5–15)
BUN: 17 mg/dL (ref 6–20)
CO2: 21 mmol/L — ABNORMAL LOW (ref 22–32)
Calcium: 9.4 mg/dL (ref 8.9–10.3)
Chloride: 107 mmol/L (ref 98–111)
Creatinine, Ser: 1.27 mg/dL — ABNORMAL HIGH (ref 0.61–1.24)
GFR, Estimated: >60 mL/min (ref >60)
Glucose, Bld: 100 mg/dL — ABNORMAL HIGH (ref 70–99)
Potassium: 4.3 mmol/L (ref 3.5–5.1)
Sodium: 138 mmol/L (ref 135–145)

## 2021-10-14 LAB — DIGOXIN LEVEL: Digoxin Level: 0.9 ng/mL (ref 0.8–2.0)

## 2021-10-14 LAB — BRAIN NATRIURETIC PEPTIDE: B Natriuretic Peptide: 950.9 pg/mL — ABNORMAL HIGH (ref 0.0–100.0)

## 2021-10-14 MED ORDER — LOSARTAN POTASSIUM 25 MG PO TABS: 25.0000 mg | ORAL_TABLET | Freq: Two times a day (BID) | ORAL | 3 refills | Status: DC

## 2021-10-14 NOTE — Patient Instructions (Signed)
Increase Losartan 25 mg Twice daily  Labs done today, your results will be available in MyChart, we will contact you for abnormal readings.  You have been referred to [facility] Dr. Mosetta Pigeon, they will call you to schedule an appointment  Your physician recommends that you schedule a follow-up appointment in: 2 months  If you have any questions or concerns before your next appointment please send [place] a message through [facility] or call our office at [phone].    TO LEAVE A MESSAGE FOR THE NURSE SELECT OPTION 2, PLEASE LEAVE A MESSAGE INCLUDING: YOUR NAME DATE OF BIRTH CALL BACK NUMBER REASON FOR CALL**this is important as we prioritize the call backs  YOU WILL RECEIVE A CALL BACK THE SAME DAY AS LONG AS YOU CALL BEFORE 4:00 PM  At the [facility], you and your health needs are our priority. As part of our continuing mission to provide you with exceptional heart care, we have created designated Provider Care Teams. These Care Teams include your primary Cardiologist (physician) and Advanced Practice Providers (APPs- Physician Assistants and Nurse Practitioners) who all work together to provide you with the care you need, when you need it.   You may see any of the following providers on your designated Care Team at your next follow up: Dr Glori Bickers Dr Haynes Kerns, NP Lyda Jester, [place] [facility] [place], [place] Audry Riles, PharmD   Please be sure to bring in all your medications bottles to every appointment.   Do the following things EVERYDAY: Weigh yourself in the morning before breakfast. Write it down and keep it in a log. Take your medicines as prescribed Eat low salt foods--Limit salt (sodium) to 2000 mg per day.  Stay as active as you can everyday Limit all fluids for the day to less than 2 liters

## 2021-10-14 NOTE — Progress Notes (Signed)
Referral faxed to [facility] on 10/14/2021 @ 16.05pm

## 2021-10-25 ENCOUNTER — Encounter (HOSPITAL_COMMUNITY): Payer: Self-pay | Admitting: *Deleted

## 2021-10-25 NOTE — Progress Notes (Signed)
Received signed ROI from [facility] requesting 10/14/21 OV note, note faxed

## 2021-11-01 ENCOUNTER — Other Ambulatory Visit: Payer: Self-pay

## 2021-11-01 NOTE — Patient Outreach (Signed)
[facility]) Care Management ? ?11/01/2021 ? ?Thomas Wood ?Mar 23, 1996 ?[id number] ? ? ?Telephone outreach to patient to obtain mRS was successfully completed. MRS= 0 ? ?Philmore Pali ?[place] Management Assistant ?[phone] ? ?

## 2021-12-08 ENCOUNTER — Encounter (HOSPITAL_COMMUNITY): Payer: Self-pay | Admitting: Internal Medicine

## 2021-12-09 ENCOUNTER — Other Ambulatory Visit (HOSPITAL_COMMUNITY): Payer: Self-pay | Admitting: Internal Medicine

## 2021-12-19 ENCOUNTER — Ambulatory Visit (HOSPITAL_COMMUNITY)
Admission: RE | Admit: 2021-12-19 | Discharge: 2021-12-19 | Disposition: A | Discharge status: Home / Self Care | Payer: 59 | Source: Ambulatory Visit | Attending: Internal Medicine | Admitting: Internal Medicine

## 2021-12-19 ENCOUNTER — Encounter (HOSPITAL_COMMUNITY): Payer: Self-pay | Admitting: Internal Medicine

## 2021-12-19 VITALS — BP 114/78 | HR 75 | Wt 197.4 lb

## 2021-12-19 DIAGNOSIS — Z8673 Personal history of transient ischemic attack (TIA), and cerebral infarction without residual deficits: Secondary | ICD-10-CM | POA: Diagnosis not present

## 2021-12-19 DIAGNOSIS — I5022 Chronic systolic (congestive) heart failure: Secondary | ICD-10-CM

## 2021-12-19 DIAGNOSIS — I493 Ventricular premature depolarization: Secondary | ICD-10-CM | POA: Diagnosis not present

## 2021-12-19 LAB — BASIC METABOLIC PANEL
Anion gap: 6 (ref 5–15)
BUN: 19 mg/dL (ref 6–20)
CO2: 25 mmol/L (ref 22–32)
Calcium: 9.6 mg/dL (ref 8.9–10.3)
Chloride: 108 mmol/L (ref 98–111)
Creatinine, Ser: 1.44 mg/dL — ABNORMAL HIGH (ref 0.61–1.24)
GFR, Estimated: >60 mL/min (ref >60)
Glucose, Bld: 105 mg/dL — ABNORMAL HIGH (ref 70–99)
Potassium: 4.8 mmol/L (ref 3.5–5.1)
Sodium: 139 mmol/L (ref 135–145)

## 2021-12-19 LAB — BRAIN NATRIURETIC PEPTIDE: B Natriuretic Peptide: 845.8 pg/mL — ABNORMAL HIGH (ref 0.0–100.0)

## 2021-12-19 MED ORDER — METOPROLOL SUCCINATE ER 25 MG PO TB24: 25.0000 mg | ORAL_TABLET | Freq: Every evening | ORAL | 1 refills | Status: DC

## 2021-12-19 NOTE — Progress Notes (Addendum)
? ?[facility] CLINIC NOTE ? ?OMA:YOKH, Bertram Millard, MD ?Primary Cardiologist: DB ? ?HPI: ? ?Thomas Wood is a 26 y/o male with h/o morbid obesity s/p marked weight loss admitted in 11/22 with acute systolic HF EF 10-15% with LVIDd > 7cm. Father (Donal Lorrin Goodell) with h/o cardiac arrest/HF due to NICM and has undergone VAD placement (here) and OHTx at [facility].  ?  ?R/LHC normal coronary arteries and low output, CI 1.7. Complicated by severe vagal event and near arrest in the cath lab, resuscitated with epinephrine, NE and IVF. cMRI showed severe biventricular dysfunction w/ severe LV dilation and globally elevated ECV signal, w/ myocardial thinning and scar c/w a chronic process. LVEF 9%. RVEF 2% No LV thrombus.  ?  ?Had high PVC burden  >20%. Started on mexiletine with dose increased to 300 mg twice a day . PVC burden reduced. Discharged home w/ LifeVest.  ? 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 Treated with t-PA with complete resolution of symptoms. Cerebral arteriogram with no clot. Repeat echo EF <20% RV moderately down. No frank LV clot. Apixaban started. Had recurrent PVCs so amio added to mexilitene for PVC suppression. BP low so midodrine added and losartan held   ? ?Here for f/u. Feeling pretty good. Just got back from the beach. Not very active but says he was pretty SOB when he was walking through the sand. Can do all ADLs without problem. Compliant with all meds. SBP 115-125. HR improved ? ?Echo 09/18/21: EF 15% RV moderately down. Severe MR Personally reviewed ?Zio 1/23: 11.4% PVCs ? ?CPX 2/23 ?FVC 2.01 (41%)      ?FEV1 1.68 (41%)        ?MVV 72 (38%)      ? ?Resting HR: 80 Standing HR: 83 Peak HR: 148   (76% age predicted max HR)  ?BP rest: 108/68 Standing BP: 100/62 BP peak: 118/64  ?Peak VO2: 17.3 (37% predicted peak VO2)  ?VE/VCO2 slope:  40  ?Peak RER: 1.15  ?VE/MVV:  98%  ?O2pulse:  10   (48% predicted O2pulse)  ? ? ?ROS: All systems negative except as listed in HPI, PMH and Problem List. ? ?SH:  ?Social  History  ? ?Socioeconomic History  ? Marital status: Significant Other  ?  Spouse name: Not on file  ? Number of children: Not on file  ? Years of education: Not on file  ? Highest education level: Not on file  ?Occupational History  ? Not on file  ?Tobacco Use  ? Smoking status: Former  ?  Types: Cigarettes  ?  Quit date: 2018  ?  Years since quitting: 5.3  ? Smokeless tobacco: Never  ?Vaping Use  ? Vaping Use: Never used  ?Substance and Sexual Activity  ? Alcohol use: No  ? Drug use: No  ? Sexual activity: Yes  ?  Birth control/protection: Pill  ?Other Topics Concern  ? Not on file  ?Social History Narrative  ? Not on file  ? ?Social Determinants of Health  ? ?Financial Resource Strain: Low Risk   ? Difficulty of Paying Living Expenses: Not hard at all  ?Food Insecurity: No Food Insecurity  ? Worried About Programme researcher, broadcasting/film/video in the Last Year: Never true  ? Ran Out of Food in the Last Year: Never true  ?Transportation Needs: No Transportation Needs  ? Lack of Transportation (Medical): No  ? Lack of Transportation (Non-Medical): No  ?Physical Activity: Not on file  ?Stress: No Stress Concern Present  ?  Feeling of Stress : Not at all  ?Social Connections: Moderately Integrated  ? Frequency of Communication with Friends and Family: Three times a week  ? Frequency of Social Gatherings with Friends and Family: Three times a week  ? Attends Religious Services: 1 to 4 times per year  ? Active Member of Clubs or Organizations: Yes  ? Attends Banker Meetings: 1 to 4 times per year  ? Marital Status: Never married  ?Intimate Partner Violence: Not At Risk  ? Fear of Current or Ex-Partner: No  ? Emotionally Abused: No  ? Physically Abused: No  ? Sexually Abused: No  ? ? ?FH:  ?Family History  ?Problem Relation Age of Onset  ? Diabetes Mother   ? Asthma Mother   ? Cancer Mother   ?     breast  ? Miscarriages / [place] Mother   ? Kidney disease Mother   ?     stones  ? Heart disease Father 67  ?     MI, heart  transplant  ? Diabetes Father   ? Heart failure Father   ?     LVAD>>Heart Transplant (40s)  ? Asthma Brother   ? Diabetes Maternal Grandmother   ? Cancer Maternal Grandmother   ?     Breast  ? Kidney disease Maternal Grandmother   ? Stroke Maternal Grandmother   ? Diabetes Paternal Grandmother   ? Cancer Paternal Grandfather   ? ? ?Past Medical History:  ?Diagnosis Date  ? Asthma   ? 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  ? Chronic kidney disease   ? stones  ? GERD (gastroesophageal reflux disease)   ? New onset of congestive heart failure ([facility]) 07/11/2021  ? ? ?Current Outpatient Medications  ?Medication Sig Dispense Refill  ? albuterol (VENTOLIN HFA) 108 (90 Base) MCG/ACT inhaler Inhale 2 puffs into the lungs every 6 (six) hours as needed for wheezing.    ? amiodarone (PACERONE) 200 MG tablet Take 200 mg by mouth daily.    ? apixaban (ELIQUIS) 5 MG TABS tablet Take 1 tablet (5 mg total) by mouth 2 (two) times daily. 60 tablet 3  ? atorvastatin (LIPITOR) 40 MG tablet Take 1 tablet (40 mg total) by mouth daily. 30 tablet 3  ? Cetirizine HCl (ZYRTEC ALLERGY PO) Take 1 tablet by mouth daily.    ? digoxin (LANOXIN) 0.125 MG tablet Take 1 tablet (0.125 mg total) by mouth daily. 30 tablet 6  ? empagliflozin (JARDIANCE) 10 MG TABS tablet Take 1 tablet (10 mg total) by mouth daily. 30 tablet 6  ? losartan (COZAAR) 25 MG tablet Take 1 tablet (25 mg total) by mouth in the morning and at bedtime. 30 tablet 3  ? mexiletine (MEXITIL) 150 MG capsule TAKE 2 CAPSULES(300 MG) BY MOUTH EVERY 12 HOURS 60 capsule 3  ? spironolactone (ALDACTONE) 25 MG tablet Take 1 tablet (25 mg total) by mouth daily. 30 tablet 3  ? ?No current facility-administered medications for this encounter.  ? ? ?Vitals:  ? 12/19/21 1205  ?BP: 114/78  ?Pulse: 75  ?SpO2: 99%  ?Weight: 89.5 kg (197 lb 6.4 oz)  ? ? ? ?PHYSICAL EXAM: ?General:  Well appearing. No resp difficulty ?HEENT: normal ?Neck: supple. no JVD.  Carotids 2+ bilat; no bruits. No lymphadenopathy or thryomegaly appreciated. ?Cor: PMI nondisplaced. Regular rate & rhythm. No rubs, gallops or murmurs. ?Lungs: clear ?Abdomen: soft, nontender, nondistended. No hepatosplenomegaly. No bruits or masses. Good bowel  sounds. ?Extremities: no cyanosis, clubbing, rash, edema ?Neuro: alert & orientedx3, cranial nerves grossly intact. moves all 4 extremities w/o difficulty. Affect pleasant ? ? ? ?ECG: NSR 77 QRS 1116 ms No PVCs Personally reviewed ? ? ?LifeVest interrogation: 30 reported episodes of NSVT. Personally reviewed ?  ? ?ASSESSMENT & PLAN: ? ?1. 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 Cath complicated by severe vagal episode with near loss of pulse. ?- cMRI 11/22 - severe biventricular dysfunciton myocardial thinning and scar. LVEF 9% RVEF 2% ?- Concern for familial CM. Possible contribution from frequent PVCs, Father had CM in 36s and had LVAD as bridge to eventual transplant  ?- Echo 09/18/21: EF 15% RV moderately down. Severe MR  ?- CPX 2/23 Severe restrictive lung disease with severe HF limitation. pVO2 17.3 (37%) slope 40 RER: 1.15  ?VE/MVV:  98%  ?- Stable NYHA II-early III ?- Continue dig 0.125 ?- Continue Jardiance 10 ?- Continue spiro 25 daily  ?- Continue losartan 25 mg bid ?- Will try Toprol 25 -> watch closely for low output symptom ?- Blood Type O-pos ?- Offered ICD again but he wants to continue LifeVest will need to decide on ICD soon.  ?- EF not improving with attempts at PVC suppression and slow titration of GDMT as tolerated with his low BP. Based on family history, echo and MRI images, I worry he will not recover well. CPX confirms severe HF limitation. Refer to [facility] to begin transplant w/u. I d/w Dr. Edwena Blow at [facility]. Has referral for genetic w/u including LMNA (scheduled for  May) ?  ?2. Severe MR ?-  functional due to severely dilated LV  ?- continue HF treatment ?  ?3. Frequent PVCs ?- TSH normal  ?- improved on amio and mexilitene ?- outpatient sleep study to r/o OSA  ?- Zio 1/

## 2021-12-19 NOTE — Patient Instructions (Signed)
Good to see you today! ? ?START Toprol 25 mg every night ? ?Labs done today, your results will be available in MyChart, we will contact you for abnormal readings ? ? ?Your physician recommends that you schedule a follow-up appointment in: 3 months ? ?If you have any questions or concerns before your next appointment please send [place] a message through [place] or call our office at [phone].   ? ?TO LEAVE A MESSAGE FOR THE NURSE SELECT OPTION 2, PLEASE LEAVE A MESSAGE INCLUDING: ?YOUR NAME ?DATE OF BIRTH ?CALL BACK NUMBER ?REASON FOR CALL**this is important as we prioritize the call backs ? ?YOU WILL RECEIVE A CALL BACK THE SAME DAY AS LONG AS YOU CALL BEFORE 4:00 PM ? ?At the [facility], you and your health needs are our priority. As part of our continuing mission to provide you with exceptional heart care, we have created designated Provider Care Teams. These Care Teams include your primary Cardiologist (physician) and Advanced Practice Providers (APPs- Physician Assistants and Nurse Practitioners) who all work together to provide you with the care you need, when you need it.  ? ?You may see any of the following providers on your designated Care Team at your next follow up: ?Dr Glori Bickers ?Dr Loralie Champagne ?Darrick Grinder, NP ?Lyda Jester, PA ?Jessica Milford,NP ?Marlyce Huge, PA ?Audry Riles, PharmD ? ? ?Please be sure to bring in all your medications bottles to every appointment.  ? ? ? ?

## 2021-12-20 ENCOUNTER — Encounter (HOSPITAL_COMMUNITY): Payer: Self-pay | Admitting: Internal Medicine

## 2021-12-31 ENCOUNTER — Ambulatory Visit: Payer: 59 | Admitting: Genetic Counselor

## 2022-01-07 ENCOUNTER — Encounter: Payer: 59 | Admitting: Genetic Counselor

## 2022-01-08 DIAGNOSIS — I493 Ventricular premature depolarization: Secondary | ICD-10-CM | POA: Insufficient documentation

## 2022-01-08 DIAGNOSIS — N182 Chronic kidney disease, stage 2 (mild): Secondary | ICD-10-CM | POA: Insufficient documentation

## 2022-01-13 ENCOUNTER — Other Ambulatory Visit (HOSPITAL_COMMUNITY): Payer: Self-pay | Admitting: Internal Medicine

## 2022-01-19 ENCOUNTER — Encounter (HOSPITAL_COMMUNITY): Payer: Self-pay | Admitting: Internal Medicine

## 2022-01-20 ENCOUNTER — Other Ambulatory Visit (HOSPITAL_COMMUNITY): Payer: Self-pay

## 2022-01-20 MED ORDER — APIXABAN 5 MG PO TABS: 5.0000 mg | ORAL_TABLET | Freq: Two times a day (BID) | ORAL | 3 refills | Status: DC

## 2022-01-20 MED ORDER — ATORVASTATIN CALCIUM 40 MG PO TABS: 40.0000 mg | ORAL_TABLET | Freq: Every day | ORAL | 3 refills | Status: DC

## 2022-01-20 MED ORDER — SPIRONOLACTONE 25 MG PO TABS: 25.0000 mg | ORAL_TABLET | Freq: Every day | ORAL | 3 refills | Status: DC

## 2022-01-20 NOTE — Progress Notes (Signed)
Pre Test GC  Referring Provider: Arvilla Meres, MD   Referral Reason  Thomas Wood" was referred for genetic consult and testing for Non-ischemic cardiomyopathy (NICM) in light of his NICM presentation and the significant family history of HF in his father.  Personal Medical Information Thomas Wood (IV.3 on pedigree), a pleasant 26 year-old [place] American gentleman is here today with his father, Thomas Wood. 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 He reports noticing shortness of breath for 2 weeks that occurred without exertion. He tells me that he woke up one night with severe dyspnea and promptly went to the Urgent care and was sent to [facility] hospital. He was found to have fluid around his heart and was treated with Lasix. Subsequent cardiac imaging studies detected an EF of 5% to 10% and he was later discharged on a Life Vest and medications. He is now scheduled to have an evaluation for heart transplant at [facility].   Traditional Risk Factors Thomas Wood denies having other cardiac or systemic conditions that can cause non-ischemic cardiomyopathy, namely, ischemic heart disease, myocarditis, infiltrative myocardial disease (amyloidosis, sarcoidosis, hemochromatosis), HTN, infection with HIV virus, connective tissue disease (such as systemic lupus erythematosus etc.). He also denies substance abuse (chronic alcohol abuse, cocaine abuse), doxorubicin therapy and of having other cardiovascular diseases (valvular heart disease, HCM).    Family history Thomas Wood (IV.3) has two healthy younger siblings- a 20 y.o brother and 6 y.o sister (IV.4-IV.5). Both have not yet undergone a cardiac evaluation.   His father (III.3) was also diagnosed with heart failure at age 39 when he was found to have an EF of 5% to 10%. He tells me that he was at work sowing grass when he blacked out and was taken to the hospital by his co-workers. He was found to have diabetes and HTN at this  visit and was told of having arrhythmia. He had an ICD implanted at that time and had several appropriate shocks subsequently. He later had an LVAD at age 55 and at 25 had an orthotopic heart transplant at [facility]. He now reports feeling much better. His father (II.7) died of cancer but also had heart disease due to his cancer medication.  His mother (II.4) is of Caucasian descent and in very good health. Her father died of cancer at age 35 and mother was diagnosed with breast cancer but died at 24 from ESRD.   Pre-test Genetic Consult notes  I reviewed the different genetic cardiomyopathies that are considered non-ischemic cardiomyopathy, namely ARVC (now called ACM), DCM, HCM and LVNC. I discussed the genetics of HCM, DCM, LVNC and ACM, namely inheritance, incomplete penetrance, variable expression and digenic/compound mutations that can be seen in some patients. We walked through the process of genetic testing. I explained to her that there are three possible outcomes of genetic testing; namely positive, negative and finding a variant of unknown significance. A positive outcome can be expected in cases that do not have risk factors for a cardiomyopathy, present early in life with increased severity and have a family history of sudden cardiac death and/or a relative that has been diagnosed with cardiomyopathy. Limitations in current genetic testing methodology can produce a negative result. Variants of unknown significance (VUS) are also observed. I explained to him that typically a VUS is so classified if the variant is not well understood as very few individuals have been reported to harbor this variant or its role in gene function has not been elucidated. The  potential outcomes of genetic testing and subsequent management of at-risk family members were discussed so as to manage expectations.   Impression  In summary, Thomas Wood's early age and severity of presentation along with the significant family history of  CHF with heart transplant in his father is indicative of a genetic condition, As this is an autosomal dominant condition, first-degree relatives are at a 50% risk of inheriting NICM. Genetic testing for genes implicated in nonischemic cardiomyopathy is highly recommended. The test will determine the underlying genetic basis of his condition and stratify risk of NICM in his family.   In addition, we discussed the protections afforded by the Genetic Information Non-Discrimination Act (GINA). I explained to her that GINA protects her from losing her employment or health insurance based on her genotype. However, these protections do not cover life insurance and disability. He verbalized understanding of this and notes that his siblings have life insurance through their mother's work.  Please note that the patient has not been counseled in this visit on personal, cultural or ethical issues that he may face due to his heart condition.    Lattie Corns, Ph.D, [facility] Clinical Molecular Geneticist

## 2022-02-03 ENCOUNTER — Other Ambulatory Visit (HOSPITAL_COMMUNITY): Payer: Self-pay | Admitting: *Deleted

## 2022-02-03 MED ORDER — MEXILETINE HCL 150 MG PO CAPS: ORAL_CAPSULE | ORAL | 3 refills | Status: DC

## 2022-02-07 ENCOUNTER — Other Ambulatory Visit (HOSPITAL_COMMUNITY): Payer: Self-pay | Admitting: Internal Medicine

## 2022-02-11 ENCOUNTER — Other Ambulatory Visit (HOSPITAL_COMMUNITY): Payer: Self-pay

## 2022-02-11 MED ORDER — SPIRONOLACTONE 25 MG PO TABS: 25.0000 mg | ORAL_TABLET | Freq: Every day | ORAL | 3 refills | Status: DC

## 2022-02-23 ENCOUNTER — Encounter (HOSPITAL_COMMUNITY): Payer: Self-pay | Admitting: Internal Medicine

## 2022-02-24 ENCOUNTER — Other Ambulatory Visit (HOSPITAL_COMMUNITY): Payer: Self-pay

## 2022-02-24 MED ORDER — DIGOXIN 125 MCG PO TABS: 0.1250 mg | ORAL_TABLET | Freq: Every day | ORAL | 11 refills | Status: DC

## 2022-02-24 MED ORDER — EMPAGLIFLOZIN 10 MG PO TABS: 10.0000 mg | ORAL_TABLET | Freq: Every day | ORAL | 11 refills | Status: DC

## 2022-03-03 ENCOUNTER — Other Ambulatory Visit (HOSPITAL_COMMUNITY): Payer: Self-pay | Admitting: *Deleted

## 2022-03-03 MED ORDER — LOSARTAN POTASSIUM 25 MG PO TABS: 25.0000 mg | ORAL_TABLET | Freq: Two times a day (BID) | ORAL | 3 refills | Status: DC

## 2022-03-21 ENCOUNTER — Other Ambulatory Visit (HOSPITAL_COMMUNITY): Payer: Self-pay

## 2022-03-21 ENCOUNTER — Encounter (HOSPITAL_COMMUNITY): Payer: Self-pay | Admitting: Internal Medicine

## 2022-03-21 ENCOUNTER — Ambulatory Visit (HOSPITAL_COMMUNITY)
Admission: RE | Admit: 2022-03-21 | Discharge: 2022-03-21 | Disposition: A | Discharge status: Home / Self Care | Payer: 59 | Source: Ambulatory Visit | Attending: Internal Medicine | Admitting: Internal Medicine

## 2022-03-21 VITALS — BP 112/78 | HR 67 | Wt 201.0 lb

## 2022-03-21 DIAGNOSIS — L905 Scar conditions and fibrosis of skin: Secondary | ICD-10-CM | POA: Insufficient documentation

## 2022-03-21 DIAGNOSIS — I5022 Chronic systolic (congestive) heart failure: Secondary | ICD-10-CM | POA: Diagnosis present

## 2022-03-21 DIAGNOSIS — I493 Ventricular premature depolarization: Secondary | ICD-10-CM | POA: Diagnosis not present

## 2022-03-21 DIAGNOSIS — I5082 Biventricular heart failure: Secondary | ICD-10-CM | POA: Insufficient documentation

## 2022-03-21 DIAGNOSIS — Z8673 Personal history of transient ischemic attack (TIA), and cerebral infarction without residual deficits: Secondary | ICD-10-CM | POA: Diagnosis not present

## 2022-03-21 DIAGNOSIS — J984 Other disorders of lung: Secondary | ICD-10-CM | POA: Diagnosis not present

## 2022-03-21 DIAGNOSIS — Z7901 Long term (current) use of anticoagulants: Secondary | ICD-10-CM | POA: Insufficient documentation

## 2022-03-21 DIAGNOSIS — Z79899 Other long term (current) drug therapy: Secondary | ICD-10-CM | POA: Insufficient documentation

## 2022-03-21 DIAGNOSIS — Z01818 Encounter for other preprocedural examination: Secondary | ICD-10-CM | POA: Insufficient documentation

## 2022-03-21 LAB — TSH: TSH: 4.512 u[IU]/mL — ABNORMAL HIGH (ref 0.350–4.500)

## 2022-03-21 LAB — CBC
HCT: 53 % — ABNORMAL HIGH (ref 39.0–52.0)
Hemoglobin: 17.7 g/dL — ABNORMAL HIGH (ref 13.0–17.0)
MCH: 30 pg (ref 26.0–34.0)
MCHC: 33.4 g/dL (ref 30.0–36.0)
MCV: 89.8 fL (ref 80.0–100.0)
Platelets: 183 10*3/uL (ref 150–400)
RBC: 5.9 MIL/uL — ABNORMAL HIGH (ref 4.22–5.81)
RDW: 12.6 % (ref 11.5–15.5)
WBC: 5.1 10*3/uL (ref 4.0–10.5)
nRBC: 0 % (ref 0.0–0.2)

## 2022-03-21 LAB — BRAIN NATRIURETIC PEPTIDE: B Natriuretic Peptide: 518 pg/mL — ABNORMAL HIGH (ref 0.0–100.0)

## 2022-03-21 LAB — COMPREHENSIVE METABOLIC PANEL
ALT: 1335 U/L — ABNORMAL HIGH (ref 0–44)
AST: 710 U/L — ABNORMAL HIGH (ref 15–41)
Albumin: 4.2 g/dL (ref 3.5–5.0)
Alkaline Phosphatase: 72 U/L (ref 38–126)
Anion gap: 11 (ref 5–15)
BUN: 16 mg/dL (ref 6–20)
CO2: 21 mmol/L — ABNORMAL LOW (ref 22–32)
Calcium: 9.2 mg/dL (ref 8.9–10.3)
Chloride: 104 mmol/L (ref 98–111)
Creatinine, Ser: 1.35 mg/dL — ABNORMAL HIGH (ref 0.61–1.24)
GFR, Estimated: >60 mL/min (ref >60)
Glucose, Bld: 88 mg/dL (ref 70–99)
Potassium: 4.3 mmol/L (ref 3.5–5.1)
Sodium: 136 mmol/L (ref 135–145)
Total Bilirubin: 1.6 mg/dL — ABNORMAL HIGH (ref 0.3–1.2)
Total Protein: 7.8 g/dL (ref 6.5–8.1)

## 2022-03-21 LAB — T4, FREE: Free T4: 1.6 ng/dL — ABNORMAL HIGH (ref 0.61–1.12)

## 2022-03-21 LAB — DIGOXIN LEVEL: Digoxin Level: 0.8 ng/mL (ref 0.8–2.0)

## 2022-03-21 MED ORDER — ENTRESTO 24-26 MG PO TABS: 1.0000 | ORAL_TABLET | Freq: Two times a day (BID) | ORAL | 6 refills | Status: DC

## 2022-03-21 NOTE — Patient Instructions (Signed)
Medication Changes:  STOP Losartan  START Entresto 24/26 mg Twice daily   Lab Work:  Labs done today, your results will be available in MyChart, we will contact you for abnormal readings.  Testing/Procedures:  Your physician has requested that you have an echocardiogram. Echocardiography is a painless test that uses sound waves to create images of your heart. It provides your doctor with information about the size and shape of your heart and how well your heart's chambers and valves are working. This procedure takes approximately one hour. There are no restrictions for this procedure.  Referrals:  none  Special Instructions // Education:  Do the following things EVERYDAY: Weigh yourself in the morning before breakfast. Write it down and keep it in a log. Take your medicines as prescribed Eat low salt foods--Limit salt (sodium) to 2000 mg per day.  Stay as active as you can everyday Limit all fluids for the day to less than 2 liters   Follow-Up in: 3 months  At the [facility], you and your health needs are our priority. We have a designated team specialized in the treatment of Heart Failure. This Care Team includes your primary Heart Failure Specialized Cardiologist (physician), Advanced Practice Providers (APPs- Physician Assistants and Nurse Practitioners), and Pharmacist who all work together to provide you with the care you need, when you need it.   You may see any of the following providers on your designated Care Team at your next follow up:  Dr Arvilla Meres Dr Carron Curie, NP Robbie Lis, [place] [facility] [place], [place] Karle Plumber, PharmD   Please be sure to bring in all your medications bottles to every appointment.   Need to Contact us:  If you have any questions or concerns before your next appointment please send [place] a message through [place] or call our office at [phone].    TO LEAVE A MESSAGE FOR THE  NURSE SELECT OPTION 2, PLEASE LEAVE A MESSAGE INCLUDING: YOUR NAME DATE OF BIRTH CALL BACK NUMBER REASON FOR CALL**this is important as we prioritize the call backs  YOU WILL RECEIVE A CALL BACK THE SAME DAY AS LONG AS YOU CALL BEFORE 4:00 PM

## 2022-03-21 NOTE — Progress Notes (Signed)
[facility] CLINIC NOTE  VVO:HYWV, Bertram Millard, MD Primary Cardiologist: DB  HPI:  Thomas Wood is a 26 y/o male with h/o morbid obesity s/p marked weight loss admitted in 11/22 with acute systolic HF EF 10-15% with LVIDd > 7cm. Father (Donal Lorrin Goodell) with h/o cardiac arrest/HF due to NICM and has undergone VAD placement (here) and OHTx at [facility].    R/LHC normal coronary arteries and low output, CI 1.7. Complicated by severe vagal event and near arrest in the cath lab, resuscitated with epinephrine, NE and IVF. cMRI showed severe biventricular dysfunction w/ severe LV dilation and globally elevated ECV signal, w/ myocardial thinning and scar c/w a chronic process. LVEF 9%. RVEF 2% No LV thrombus.    Had high PVC burden  >20%. Started on mexiletine with dose increased to 300 mg twice a day . PVC burden reduced. Discharged home w/ LifeVest.   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 Treated with t-PA with complete resolution of symptoms. Cerebral arteriogram with no clot. Repeat echo EF <20% RV moderately down. No frank LV clot. Apixaban started. Had recurrent PVCs so amio added to mexilitene for PVC suppression. BP low so midodrine added and losartan held    Recently seen by Dr. Edwena Blow for OHTx eval. Felt to be good candidate when ready   Here for f/u. Feels ok. Got a cold earlier in the week so struggling with that. Before felt great. Says he can do anything he would normally do.   Echo 09/18/21: EF 15% RV moderately down. Severe MR Personally reviewed Zio 1/23: 11.4% PVCs  CPX 2/23 FVC 2.01 (41%)      FEV1 1.68 (41%)        MVV 72 (38%)       Resting HR: 80 Standing HR: 83 Peak HR: 148   (76% age predicted max HR)  BP rest: 108/68 Standing BP: 100/62 BP peak: 118/64  Peak VO2: 17.3 (37% predicted peak VO2)  VE/VCO2 slope:  40  Peak RER: 1.15  VE/MVV:  98%  O2pulse:  10   (48% predicted O2pulse)    ROS: All systems negative except as listed in HPI, PMH and Problem List.  SH:  Social  History   Socioeconomic History   Marital status: Significant Other    Spouse name: Not on file   Number of children: Not on file   Years of education: Not on file   Highest education level: Not on file  Occupational History   Not on file  Tobacco Use   Smoking status: Former    Types: Cigarettes    Quit date: 2018    Years since quitting: 5.5   Smokeless tobacco: Never  Vaping Use   Vaping Use: Never used  Substance and Sexual Activity   Alcohol use: No   Drug use: No   Sexual activity: Yes    Birth control/protection: Pill  Other Topics Concern   Not on file  Social History Narrative   Not on file   Social Determinants of Health   Financial Resource Strain: Low Risk  (08/12/2021)   Overall Financial Resource Strain (CARDIA)    Difficulty of Paying Living Expenses: Not hard at all  Food Insecurity: No Food Insecurity (08/12/2021)   Hunger Vital Sign    Worried About Running Out of Food in the Last Year: Never true    Ran Out of Food in the Last Year: Never true  Transportation Needs: No Transportation Needs (08/12/2021)   PRAPARE - Transportation  Lack of Transportation (Medical): No    Lack of Transportation (Non-Medical): No  Physical Activity: Not on file  Stress: No Stress Concern Present (08/12/2021)   [employer] of Occupational Health - Occupational Stress Questionnaire    Feeling of Stress : Not at all  Social Connections: Moderately Integrated (08/12/2021)   Social Connection and Isolation Panel [NHANES]    Frequency of Communication with Friends and Family: Three times a week    Frequency of Social Gatherings with Friends and Family: Three times a week    Attends Religious Services: 1 to 4 times per year    Active Member of Clubs or Organizations: Yes    Attends Banker Meetings: 1 to 4 times per year    Marital Status: Never married  Intimate Partner Violence: Not At Risk (08/12/2021)   Humiliation, Afraid, Rape, and Kick  questionnaire    Fear of Current or Ex-Partner: No    Emotionally Abused: No    Physically Abused: No    Sexually Abused: No    FH:  Family History  Problem Relation Age of Onset   Diabetes Mother    Asthma Mother    Cancer Mother        breast   Miscarriages / Stillbirths Mother    Kidney disease Mother        stones   Heart disease Father 105       MI, heart transplant   Diabetes Father    Heart failure Father        LVAD>>Heart Transplant (40s)   Asthma Brother    Diabetes Maternal Grandmother    Cancer Maternal Grandmother        Breast   Kidney disease Maternal Grandmother    Stroke Maternal Grandmother    Diabetes Paternal Grandmother    Cancer Paternal Grandfather     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   Chronic kidney disease    stones   GERD (gastroesophageal reflux disease)    New onset of congestive heart failure (HCC) 07/11/2021    Current Outpatient Medications  Medication Sig Dispense Refill   albuterol (VENTOLIN HFA) 108 (90 Base) MCG/ACT inhaler Inhale 2 puffs into the lungs every 6 (six) hours as needed for wheezing.     amiodarone (PACERONE) 200 MG tablet Take 200 mg by mouth daily.     apixaban (ELIQUIS) 5 MG TABS tablet Take 1 tablet (5 mg total) by mouth 2 (two) times daily. 180 tablet 3   atorvastatin (LIPITOR) 40 MG tablet Take 1 tablet (40 mg total) by mouth daily. 90 tablet 3   Cetirizine HCl (ZYRTEC ALLERGY PO) Take 1 tablet by mouth daily.     digoxin (LANOXIN) 0.125 MG tablet Take 1 tablet (0.125 mg total) by mouth daily. 30 tablet 11   empagliflozin (JARDIANCE) 10 MG TABS tablet Take 1 tablet (10 mg total) by mouth daily. 30 tablet 11   losartan (COZAAR) 25 MG tablet Take 1 tablet (25 mg total) by mouth in the morning and at bedtime. 60 tablet 3   mexiletine (MEXITIL) 150 MG capsule TAKE 2 CAPSULES(300 MG) BY MOUTH EVERY 12 HOURS 60 capsule  3   spironolactone (ALDACTONE) 25 MG tablet Take 1 tablet (25 mg total) by mouth daily. 90 tablet 3   No current facility-administered medications for this encounter.    Vitals:   03/21/22 1103  BP: 112/78  Pulse: 67  SpO2: 97%  Weight: 91.2 kg (201 lb)     PHYSICAL EXAM: General:  Well appearing. No resp difficulty HEENT: normal Neck: supple. no JVD. Carotids 2+ bilat; no bruits. No lymphadenopathy or thryomegaly appreciated. Cor: PMI nondisplaced. Regular rate & rhythm. No rubs, gallops or murmurs. Lungs: clear Abdomen: soft, nontender, nondistended. No hepatosplenomegaly. No bruits or masses. Good bowel sounds. Extremities: no cyanosis, clubbing, rash, edema Neuro: alert & orientedx3, cranial nerves grossly intact. moves all 4 extremities w/o difficulty. Affect pleasant    ASSESSMENT & PLAN:  1. 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 Cath complicated by severe vagal episode with near loss of pulse. - cMRI 11/22 - severe biventricular dysfunciton myocardial thinning and scar. LVEF 9% RVEF 2% - Concern for familial CM. Possible contribution from frequent PVCs, Father had CM in 60s and had LVAD as bridge to eventual transplant  - Echo 09/18/21: EF 15% RV moderately down. Severe MR  - CPX 2/23 Severe restrictive lung disease with severe HF limitation. pVO2 17.3 (37%) slope 40 RER: 1.15  VE/MVV:  98%  - Stable NYHA II - Continue dig 0.125 - Continue Jardiance 10 - Continue spiro 25 daily  - Switch losartan 25 mg bid to Entresto 24/26 bid - Failed Toprol due to low output symptoms - Blood Type O-pos - Offered ICD again but he wants to continue LifeVest  - EF not improving with attempts at PVC suppression and slow titration of GDMT as tolerated with his low BP. Based on family history, echo and MRI images, I worry he  will not recover well. CPX confirms severe HF limitation. Refer to [facility] to begin transplant w/u. Has seen Dr. Jomarie Longs for genetic w/u including LMNA (results still pending) - Has seen Dr. Edwena Blow at [facility] for transplant eval    2. Severe MR -  functional due to severely dilated LV  - continue HF treatment   3. Frequent PVCs - TSH normal  - improved on amio and mexilitene - outpatient sleep study to r/o OSA  - Zio 1/23: 11.4% PVCs  4. H/o CVA  - cardio-embolic in 11/22. Treated t-PA - continue apixaban/statin  - no residual   Arvilla Meres, MD  11:38 AM

## 2022-03-22 LAB — T3, FREE: T3, Free: 3.3 pg/mL (ref 2.0–4.4)

## 2022-03-24 ENCOUNTER — Encounter (HOSPITAL_COMMUNITY): Payer: Self-pay | Admitting: *Deleted

## 2022-03-24 NOTE — Progress Notes (Signed)
Disability form completed, signed by Dr Gala Romney, and faxed to [facility] along with recent OV note

## 2022-03-27 ENCOUNTER — Encounter (HOSPITAL_COMMUNITY): Payer: Self-pay | Admitting: Internal Medicine

## 2022-03-31 ENCOUNTER — Other Ambulatory Visit (HOSPITAL_COMMUNITY): Payer: Self-pay | Admitting: *Deleted

## 2022-03-31 DIAGNOSIS — R7989 Other specified abnormal findings of blood chemistry: Secondary | ICD-10-CM

## 2022-03-31 NOTE — Telephone Encounter (Signed)
Orders placed and given to [facility] to schedule

## 2022-04-03 ENCOUNTER — Ambulatory Visit (HOSPITAL_COMMUNITY)
Admission: RE | Admit: 2022-04-03 | Discharge: 2022-04-03 | Disposition: A | Discharge status: Home / Self Care | Payer: 59 | Source: Ambulatory Visit | Attending: Family Medicine | Admitting: Family Medicine

## 2022-04-03 ENCOUNTER — Ambulatory Visit (HOSPITAL_COMMUNITY)
Admission: RE | Admit: 2022-04-03 | Discharge: 2022-04-03 | Disposition: A | Discharge status: Home / Self Care | Payer: 59 | Source: Ambulatory Visit | Attending: Internal Medicine | Admitting: Internal Medicine

## 2022-04-03 ENCOUNTER — Ambulatory Visit (HOSPITAL_COMMUNITY)
Admission: RE | Admit: 2022-04-03 | Discharge: 2022-04-03 | Disposition: A | Discharge status: Home / Self Care | Payer: 59 | Source: Ambulatory Visit | Attending: Cardiology | Admitting: Cardiology

## 2022-04-03 DIAGNOSIS — K219 Gastro-esophageal reflux disease without esophagitis: Secondary | ICD-10-CM | POA: Insufficient documentation

## 2022-04-03 DIAGNOSIS — N189 Chronic kidney disease, unspecified: Secondary | ICD-10-CM | POA: Insufficient documentation

## 2022-04-03 DIAGNOSIS — R7989 Other specified abnormal findings of blood chemistry: Secondary | ICD-10-CM

## 2022-04-03 DIAGNOSIS — I5022 Chronic systolic (congestive) heart failure: Secondary | ICD-10-CM | POA: Diagnosis not present

## 2022-04-03 DIAGNOSIS — I081 Rheumatic disorders of both mitral and tricuspid valves: Secondary | ICD-10-CM | POA: Insufficient documentation

## 2022-04-03 LAB — HEPATIC FUNCTION PANEL
ALT: 234 U/L — ABNORMAL HIGH (ref 0–44)
AST: 133 U/L — ABNORMAL HIGH (ref 15–41)
Albumin: 4.2 g/dL (ref 3.5–5.0)
Alkaline Phosphatase: 70 U/L (ref 38–126)
Bilirubin, Direct: 0.4 mg/dL — ABNORMAL HIGH (ref 0.0–0.2)
Indirect Bilirubin: 1.4 mg/dL — ABNORMAL HIGH (ref 0.3–0.9)
Total Bilirubin: 1.8 mg/dL — ABNORMAL HIGH (ref 0.3–1.2)
Total Protein: 7.4 g/dL (ref 6.5–8.1)

## 2022-04-03 LAB — ECHOCARDIOGRAM COMPLETE
Area-P 1/2: 7.29 cm2
MV M vel: 4.41 m/s
MV Peak grad: 77.8 mmHg
MV VTI: 2.97 cm2
Radius: 0.57 cm
S' Lateral: 7.8 cm

## 2022-04-03 LAB — HIV ANTIBODY (ROUTINE TESTING W REFLEX): HIV Screen 4th Generation wRfx: NONREACTIVE

## 2022-04-03 NOTE — Progress Notes (Signed)
  Echocardiogram 2D Echocardiogram has been performed.  Leta Jungling M 04/03/2022, 10:54 AM

## 2022-04-04 LAB — MITOCHONDRIAL ANTIBODIES: Mitochondrial M2 Ab, IgG: <20 Units (ref 0.0–20.0)

## 2022-04-07 ENCOUNTER — Ambulatory Visit (INDEPENDENT_AMBULATORY_CARE_PROVIDER_SITE_OTHER): Payer: 59 | Admitting: Family Medicine

## 2022-04-07 ENCOUNTER — Encounter: Payer: Self-pay | Admitting: Family Medicine

## 2022-04-07 VITALS — BP 116/74 | HR 60 | Temp 97.4°F | Ht 71.0 in | Wt 208.0 lb

## 2022-04-07 DIAGNOSIS — T462X5D Adverse effect of other antidysrhythmic drugs, subsequent encounter: Secondary | ICD-10-CM

## 2022-04-07 DIAGNOSIS — I493 Ventricular premature depolarization: Secondary | ICD-10-CM

## 2022-04-07 DIAGNOSIS — I5022 Chronic systolic (congestive) heart failure: Secondary | ICD-10-CM | POA: Diagnosis not present

## 2022-04-07 DIAGNOSIS — Z8673 Personal history of transient ischemic attack (TIA), and cerebral infarction without residual deficits: Secondary | ICD-10-CM | POA: Diagnosis not present

## 2022-04-07 NOTE — Progress Notes (Signed)
[facility] [address] [place] [address] Dept: [phone] Dept Fax: [phone]  Chronic Care Office Visit  Subjective:    Patient ID: Thomas Wood, male    DOB: Dec 24, 1995, 26 y.o..   MRN: [id number]  Chief Complaint  Patient presents with   Follow-up    6 month f/u.  No concerns.  No fasting.       History of Present Illness:  Patient is in today for reassessment of chronic medical issues.  Thomas Wood was admitted at [facility] in Nov. 2022 with acute systolic HF with an EF of 10-15%. He had a dilated cardiomyopathy. His father had a history of early coronary artery disease and has had a heart transplant. Thomas Wood had a near arrest while undergoing cardiac catheterization. He was noted to have a >20% PVC burden, so was started on mexilitene and was discharged with a LifeVest. He was readmitted later in Nov. with left facial numbness and diplopia secondary to an acute basilar artery CVA. This was treated with tPA with complete resolution. He is now on Eliquis. As he had persistent PVCs, amiodarone was added to his mexilitene. He was also managed on atorvastatin, digoxin, empagliflozin, losartan, and spironolactone.   Recently, he saw Dr. Gala Romney for follow up. His losartan was stopped and he was switched to [facility]. Thomas Wood is being evaluated for a possible heart transplant. He is awaiting genetic testing for both himself and his father. Dr. Gala Romney had conducted some routine lab tests and found Thomas Wood to have a significantly elevated transaminases and abnormal thyroid studies. He stopped both his amiodarone and atorvastatin.  Thomas Wood states he is not having any dyspnea, though he admits that he does not exert himself much. He has not noted any PVCs since stopping his amiodarone. He states his mood is overall good. He is in acceptance of his heart issues and states he does not worry about this.  Past Medical  History: Patient Active Problem List   Diagnosis Date Noted   PVC (premature ventricular contraction) 01/08/2022   Chronic kidney disease (CKD), stage II (mild) 01/08/2022   History of basilar artery thrombosis 09/30/2021   Mild intermittent asthma 09/30/2021   Chronic systolic heart failure (HCC) 07/11/2021   Low HDL (under 40) 09/18/2017   Environmental allergies 08/11/2016   History of kidney stones 08/11/2016   Past Surgical History:  Procedure Laterality Date   ARTERIAL LINE INSERTION N/A 07/12/2021   Procedure: ARTERIAL LINE INSERTION;  Surgeon: Dolores Patty, MD;  Location: [facility] INVASIVE CV [facility];  Service: Cardiovascular;  Laterality: N/A;   CENTRAL LINE INSERTION  07/12/2021   Procedure: CENTRAL LINE INSERTION;  Surgeon: Dolores Patty, MD;  Location: [facility];  Service: Cardiovascular;;   IR ANGIO INTRA EXTRACRAN SEL COM CAROTID INNOMINATE BILAT MOD SED  07/24/2021   IR ANGIO VERTEBRAL SEL VERTEBRAL BILAT MOD SED  07/24/2021   RADIOLOGY WITH ANESTHESIA N/A 07/24/2021   Procedure: RADIOLOGY WITH ANESTHESIA;  Surgeon: Julieanne Cotton, MD;  Location: [facility] OR;  Service: Radiology;  Laterality: N/A;   RIGHT/LEFT HEART CATH AND CORONARY ANGIOGRAPHY N/A 07/12/2021   Procedure: RIGHT/LEFT HEART CATH AND CORONARY ANGIOGRAPHY;  Surgeon: Dolores Patty, MD;  Location: [facility]VAS[facility] CV [facility];  Service: Cardiovascular;  Laterality: N/A;   Family History  Problem Relation Age of Onset   Diabetes Mother    Asthma Mother    Cancer Mother        breast  Miscarriages / Stillbirths Mother    Kidney disease Mother        stones   Heart disease Father 38       MI, heart transplant   Diabetes Father    Heart failure Father        LVAD>>Heart Transplant (53s)   Asthma Brother    Diabetes Maternal Grandmother    Cancer Maternal Grandmother        Breast   Kidney disease Maternal Grandmother    Stroke Maternal Grandmother    Diabetes Paternal Grandmother    Cancer  Paternal Grandfather    Outpatient Medications Prior to Visit  Medication Sig Dispense Refill   albuterol (VENTOLIN HFA) 108 (90 Base) MCG/ACT inhaler Inhale 2 puffs into the lungs every 6 (six) hours as needed for wheezing.     apixaban (ELIQUIS) 5 MG TABS tablet Take 1 tablet (5 mg total) by mouth 2 (two) times daily. 180 tablet 3   atorvastatin (LIPITOR) 40 MG tablet Take 1 tablet (40 mg total) by mouth daily. 90 tablet 3   Cetirizine HCl (ZYRTEC ALLERGY PO) Take 1 tablet by mouth daily.     digoxin (LANOXIN) 0.125 MG tablet Take 1 tablet (0.125 mg total) by mouth daily. 30 tablet 11   empagliflozin (JARDIANCE) 10 MG TABS tablet Take 1 tablet (10 mg total) by mouth daily. 30 tablet 11   mexiletine (MEXITIL) 150 MG capsule TAKE 2 CAPSULES(300 MG) BY MOUTH EVERY 12 HOURS 60 capsule 3   sacubitril-valsartan (ENTRESTO) 24-26 MG Take 1 tablet by mouth 2 (two) times daily. 60 tablet 6   spironolactone (ALDACTONE) 25 MG tablet Take 1 tablet (25 mg total) by mouth daily. 90 tablet 3   amiodarone (PACERONE) 200 MG tablet Take 200 mg by mouth daily.     No facility-administered medications prior to visit.   Allergies  Allergen Reactions   Amiodarone Other (See Comments)    Hepatotoxicity and hyperthyroidism    Objective:   Today's Vitals   04/07/22 0903  BP: 116/74  Pulse: 60  Temp: (!) 97.4 F (36.3 C)  TempSrc: Temporal  SpO2: 98%  Weight: 208 lb (94.3 kg)  Height: 5\' 11"  (1.803 m)   Body mass index is 29.01 kg/m.   General: Well developed, well nourished. No acute distress. Psych: Alert and oriented. Normal mood and affect.  Health Maintenance Due  Topic Date Due   INFLUENZA VACCINE  04/01/2022   Lab Results    Latest Ref Rng & Units 04/03/2022   11:19 AM 03/21/2022   11:59 AM 12/19/2021   12:48 PM  CMP  Glucose 70 - 99 mg/dL  88  105   BUN 6 - 20 mg/dL  16  19   Creatinine 0.61 - 1.24 mg/dL  1.35  1.44   Sodium 135 - 145 mmol/L  136  139   Potassium 3.5 - 5.1 mmol/L   4.3  4.8   Chloride 98 - 111 mmol/L  104  108   CO2 22 - 32 mmol/L  21  25   Calcium 8.9 - 10.3 mg/dL  9.2  9.6   Total Protein 6.5 - 8.1 g/dL 7.4  7.8    Total Bilirubin 0.3 - 1.2 mg/dL 1.8  1.6    Alkaline Phos 38 - 126 U/L 70  72    AST 15 - 41 U/L 133  710    ALT 0 - 44 U/L 234  1,335     Lab Results  Component Value  Date   TSH 4.512 (H) 03/21/2022   Imaging: Limited RUQ Ultrasound (04/03/2022) IMPRESSION: Normal study.  No cause for elevated LFTs identified.   Echocardiogram (04/03/2022) IMPRESSIONS   1. Left ventricular ejection fraction, by estimation, is <20%. The left ventricle has severely decreased function. The left ventricle demonstrates global hypokinesis. The left ventricular internal cavity size was severely dilated. Left ventricular diastolic parameters are consistent with Grade I diastolic dysfunction (impaired relaxation).   2. Right ventricular systolic function is normal. The right ventricular size is normal. There is moderately elevated pulmonary artery systolic pressure. The estimated right ventricular systolic pressure is 47.2 mmHg.   3. Regurgitant fraction volume 43 mL by 2D PISA, by 3D VC 0.59. Right sided pulmonary vein blunting. The mitral valve is normal in structure. Severe mitral valve regurgitation. No evidence of mitral stenosis.   4. Tricuspid valve regurgitation is moderate.   5. The aortic valve is tricuspid. Aortic valve regurgitation is not visualized. No aortic stenosis is present.   Comparison(s): Prior images reviewed side by side. Ventricular dilation and mitral regurgitation is worse.     Assessment & Plan:   1. Chronic systolic heart failure (HCC) Compensated HFrEF. Continue digoxin 0.125 mg daily, empagliflozin 10 mg daily, Entresto 24-26 mg daily, and spironolactone 25 mg daily.  2. PVC (premature ventricular contraction) Patient is not having any symptomatic PVCs off of amiodarone. Continue mexilitine 150 mg bid  3. History of basilar  artery thrombosis Continue BP control and Eliquis 5 mg twice daily.  4. Adverse effect of amiodarone, subsequent encounter Dr. Gala Romney plans repeat LFTs next week. We should also repeat his thyroid studies within a month. Both issues are very likely due to amiodarone toxicity.  Return in about 6 months (around 10/08/2022) for Reassessment.   Loyola Mast, MD

## 2022-04-08 ENCOUNTER — Telehealth (HOSPITAL_COMMUNITY): Payer: Self-pay

## 2022-04-08 DIAGNOSIS — I5022 Chronic systolic (congestive) heart failure: Secondary | ICD-10-CM

## 2022-04-08 NOTE — Telephone Encounter (Addendum)
Pt aware, agreeable, and verbalized understanding  Labs scheduled   ----- Message from Dolores Patty, MD sent at 04/06/2022  1:24 PM EDT ----- LFTs much improved. U/s and hepatitis panels normal. 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 Repeat CMET in 2 weeks.

## 2022-04-14 ENCOUNTER — Encounter (HOSPITAL_COMMUNITY): Payer: Self-pay | Admitting: Internal Medicine

## 2022-04-17 ENCOUNTER — Ambulatory Visit (HOSPITAL_COMMUNITY)
Admission: RE | Admit: 2022-04-17 | Discharge: 2022-04-17 | Disposition: A | Discharge status: Home / Self Care | Payer: 59 | Source: Ambulatory Visit | Attending: Internal Medicine | Admitting: Internal Medicine

## 2022-04-17 DIAGNOSIS — I5022 Chronic systolic (congestive) heart failure: Secondary | ICD-10-CM | POA: Insufficient documentation

## 2022-04-17 LAB — COMPREHENSIVE METABOLIC PANEL
ALT: 100 U/L — ABNORMAL HIGH (ref 0–44)
AST: 48 U/L — ABNORMAL HIGH (ref 15–41)
Albumin: 3.9 g/dL (ref 3.5–5.0)
Alkaline Phosphatase: 62 U/L (ref 38–126)
Anion gap: 8 (ref 5–15)
BUN: 17 mg/dL (ref 6–20)
CO2: 24 mmol/L (ref 22–32)
Calcium: 9.4 mg/dL (ref 8.9–10.3)
Chloride: 108 mmol/L (ref 98–111)
Creatinine, Ser: 1.37 mg/dL — ABNORMAL HIGH (ref 0.61–1.24)
GFR, Estimated: >60 mL/min (ref >60)
Glucose, Bld: 91 mg/dL (ref 70–99)
Potassium: 4.6 mmol/L (ref 3.5–5.1)
Sodium: 140 mmol/L (ref 135–145)
Total Bilirubin: 1.4 mg/dL — ABNORMAL HIGH (ref 0.3–1.2)
Total Protein: 6.7 g/dL (ref 6.5–8.1)

## 2022-04-22 ENCOUNTER — Encounter (HOSPITAL_COMMUNITY): Payer: Self-pay | Admitting: *Deleted

## 2022-04-22 NOTE — Progress Notes (Signed)
Received signed ROI from [facility] request last echo results, echo report faxed to them at [phone]

## 2022-06-05 ENCOUNTER — Ambulatory Visit (HOSPITAL_COMMUNITY)
Admission: RE | Admit: 2022-06-05 | Discharge: 2022-06-05 | Disposition: A | Discharge status: Home / Self Care | Payer: 59 | Source: Ambulatory Visit | Attending: Internal Medicine | Admitting: Internal Medicine

## 2022-06-05 ENCOUNTER — Encounter (HOSPITAL_COMMUNITY): Payer: Self-pay | Admitting: Internal Medicine

## 2022-06-05 ENCOUNTER — Other Ambulatory Visit (HOSPITAL_COMMUNITY): Payer: Self-pay

## 2022-06-05 ENCOUNTER — Telehealth: Payer: Self-pay | Admitting: *Deleted

## 2022-06-05 VITALS — BP 110/70 | HR 78 | Wt 219.8 lb

## 2022-06-05 DIAGNOSIS — N189 Chronic kidney disease, unspecified: Secondary | ICD-10-CM | POA: Insufficient documentation

## 2022-06-05 DIAGNOSIS — Z8679 Personal history of other diseases of the circulatory system: Secondary | ICD-10-CM | POA: Diagnosis not present

## 2022-06-05 DIAGNOSIS — I5082 Biventricular heart failure: Secondary | ICD-10-CM | POA: Diagnosis not present

## 2022-06-05 DIAGNOSIS — J984 Other disorders of lung: Secondary | ICD-10-CM | POA: Diagnosis not present

## 2022-06-05 DIAGNOSIS — I340 Nonrheumatic mitral (valve) insufficiency: Secondary | ICD-10-CM | POA: Diagnosis not present

## 2022-06-05 DIAGNOSIS — I5022 Chronic systolic (congestive) heart failure: Secondary | ICD-10-CM

## 2022-06-05 DIAGNOSIS — Z79899 Other long term (current) drug therapy: Secondary | ICD-10-CM | POA: Diagnosis not present

## 2022-06-05 DIAGNOSIS — Z7682 Awaiting organ transplant status: Secondary | ICD-10-CM | POA: Insufficient documentation

## 2022-06-05 DIAGNOSIS — I493 Ventricular premature depolarization: Secondary | ICD-10-CM

## 2022-06-05 DIAGNOSIS — Z8673 Personal history of transient ischemic attack (TIA), and cerebral infarction without residual deficits: Secondary | ICD-10-CM

## 2022-06-05 DIAGNOSIS — Z7901 Long term (current) use of anticoagulants: Secondary | ICD-10-CM | POA: Diagnosis not present

## 2022-06-05 DIAGNOSIS — L905 Scar conditions and fibrosis of skin: Secondary | ICD-10-CM | POA: Diagnosis not present

## 2022-06-05 DIAGNOSIS — Z6740 Type O blood, Rh positive: Secondary | ICD-10-CM | POA: Insufficient documentation

## 2022-06-05 MED ORDER — WARFARIN SODIUM 5 MG PO TABS: ORAL_TABLET | ORAL | 0 refills | Status: DC

## 2022-06-05 MED ORDER — MEXILETINE HCL 150 MG PO CAPS: ORAL_CAPSULE | ORAL | 3 refills | Status: DC

## 2022-06-05 MED ORDER — EMPAGLIFLOZIN 10 MG PO TABS: 10.0000 mg | ORAL_TABLET | Freq: Every day | ORAL | 11 refills | Status: DC

## 2022-06-05 NOTE — Telephone Encounter (Signed)
-----   Message from Emer M Colleran, RN sent at 06/05/2022 11:17 AM EDT ----- Needs to switch from Eliquis to Warfarin ASAP as he is Listed for transplant at [facility]   

## 2022-06-05 NOTE — Telephone Encounter (Signed)
Called pt in reference to switching from eliquis to warfarin. Advised that warfarin requires weekly monitoring and dose adjustment. Pt was advised to continue eliquis 5mg  dose for the next 3 days and start warfarin 5mg  starting tonight. He will take take eliquis 5mg  twice a day today, tomorrow, and Saturday and take warfarin 5mg  10/5, 10/6, 10/7 and continue warfarin 5mg  daily until appt on 10/10 at 1030am. Pt verbalized understanding. Also, sent in warfarin 5mg  refill to his requested pharmacy and removed eliquis off list.

## 2022-06-05 NOTE — Telephone Encounter (Signed)
-----   Message from Jerl Mina, RN sent at 06/05/2022 11:17 AM EDT ----- Needs to switch from Eliquis to Warfarin ASAP as he is Listed for transplant at [facility]

## 2022-06-05 NOTE — Telephone Encounter (Signed)
Returned a call to [employer] with [facility] and she just wanted to confirm that the we [facility]) will be managing the pt's INR and dosing the warfarin. Advised that we will unless the pt decides to come to them and at that point she could update [place]. She is aware that of the 3 day overlap with eliquis and warfarin and that we will be working to get the pt therapeutic. She will note in their ([facility]) records and was thankful for the call back.

## 2022-06-05 NOTE — Progress Notes (Signed)
[facility] CLINIC NOTE  [id number], Thomas Boxer, MD Primary Cardiologist: DB  HPI:  Thomas Wood is a 26 y/o male with h/o morbid obesity s/p marked weight loss admitted in 11/22 with acute systolic HF EF [id number] with LVIDd > 7cm. Father (Thomas [facility]) with h/o cardiac arrest/HF due to NICM and has undergone VAD placement (here) and OHTx at [facility].    R/LHC normal coronary arteries and low output, CI 1.7. Complicated by severe vagal event and near arrest in the cath lab, resuscitated with epinephrine, NE and IVF. cMRI showed severe biventricular dysfunction w/ severe LV dilation and globally elevated ECV signal, w/ myocardial thinning and scar c/w a chronic process. LVEF 9%. RVEF 2% No LV thrombus.    Had high PVC burden  >20%. Started on mexiletine with dose increased to 300 mg twice a day . PVC burden reduced. Discharged home w/ LifeVest.   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 Treated with t-PA with complete resolution of symptoms. Cerebral arteriogram with no clot. Repeat echo EF <20% RV moderately down. No frank LV clot. Apixaban started. Had recurrent PVCs so amio added to mexilitene for PVC suppression. BP low so midodrine added and losartan held    Seen at [facility] and now listed for transplant. Off amio due to liver toxicity. Now having more PVCs. Feels fine. Not very active. But can do ADls without problem. No Lifevest firing.    Echo 09/18/21: EF 15% RV moderately down. Severe MR Personally reviewed Zio 1/23: 11.4% PVCs  CPX 2/23 FVC 2.01 (41%)      FEV1 1.68 (41%)        MVV 72 (38%)       Resting HR: 80 Standing HR: 83 Peak HR: 148   (76% age predicted max HR)  BP rest: 108/68 Standing BP: 100/62 BP peak: 118/64  Peak VO2: 17.3 (37% predicted peak VO2)  VE/VCO2 slope:  40  Peak RER: 1.15  VE/MVV:  98%  O2pulse:  10   (48% predicted O2pulse)    ROS: All systems negative except as listed in HPI, PMH and Problem List.  SH:  Social History   Socioeconomic History   Marital  status: Significant Other    Spouse name: Not on file   Number of children: Not on file   Years of education: Not on file   Highest education level: Not on file  Occupational History   Not on file  Tobacco Use   Smoking status: Former    Types: Cigarettes    Quit date: 2018    Years since quitting: 5.7   Smokeless tobacco: Never  Vaping Use   Vaping Use: Never used  Substance and Sexual Activity   Alcohol use: No   Drug use: No   Sexual activity: Yes    Birth control/protection: Pill  Other Topics Concern   Not on file  Social History Narrative   Not on file   Social Determinants of Health   Financial Resource Strain: Low Risk  (08/12/2021)   Overall Financial Resource Strain (CARDIA)    Difficulty of Paying Living Expenses: Not hard at all  Food Insecurity: No Food Insecurity (08/12/2021)   Hunger Vital Sign    Worried About Running Out of Food in the Last Year: Never true    Ran Out of Food in the Last Year: Never true  Transportation Needs: No Transportation Needs (08/12/2021)   PRAPARE - Transportation    Lack of Transportation (Medical): No    Lack of  Transportation (Non-Medical): No  Physical Activity: Not on file  Stress: No Stress Concern Present (08/12/2021)   [place]    Feeling of Stress : Not at all  Social Connections: Moderately Integrated (08/12/2021)   Social Connection and Isolation Panel [NHANES]    Frequency of Communication with Friends and Family: Three times a week    Frequency of Social Gatherings with Friends and Family: Three times a week    Attends Religious Services: 1 to 4 times per year    Active Member of Clubs or Organizations: Yes    Attends Archivist Meetings: 1 to 4 times per year    Marital Status: Never married  Intimate Partner Violence: Not At Risk (08/12/2021)   Humiliation, Afraid, Rape, and Kick questionnaire    Fear of Current or Ex-Partner: No     Emotionally Abused: No    Physically Abused: No    Sexually Abused: No    FH:  Family History  Problem Relation Age of Onset   Diabetes Mother    Asthma Mother    Cancer Mother        breast   Miscarriages / Stillbirths Mother    Kidney disease Mother        stones   Heart disease Father 23       MI, heart transplant   Diabetes Father    Heart failure Father        LVAD>>Heart Transplant (15s)   Asthma Brother    Diabetes Maternal Grandmother    Cancer Maternal Grandmother        Breast   Kidney disease Maternal Grandmother    Stroke Maternal Grandmother    Diabetes Paternal Grandmother    Cancer Paternal Grandfather     Past Medical History:  Diagnosis Date   Asthma    Cerebrovascular accident (CVA) due to thrombosis of basilar artery ([place]) 07/24/2021   Chronic kidney disease    stones   GERD (gastroesophageal reflux disease)    New onset of congestive heart failure ([place]) 07/11/2021    Current Outpatient Medications  Medication Sig Dispense Refill   albuterol (VENTOLIN HFA) 108 (90 Base) MCG/ACT inhaler Inhale 2 puffs into the lungs every 6 (six) hours as needed for wheezing.     apixaban (ELIQUIS) 5 MG TABS tablet Take 1 tablet (5 mg total) by mouth 2 (two) times daily. 180 tablet 3   cetirizine (ZYRTEC) 10 MG tablet Take 10 mg by mouth as needed for allergies.     digoxin (LANOXIN) 0.125 MG tablet Take 1 tablet (0.125 mg total) by mouth daily. 30 tablet 11   empagliflozin (JARDIANCE) 10 MG TABS tablet Take 1 tablet (10 mg total) by mouth daily. 30 tablet 11   mexiletine (MEXITIL) 150 MG capsule TAKE 2 CAPSULES(300 MG) BY MOUTH EVERY 12 HOURS 60 capsule 3   sacubitril-valsartan (ENTRESTO) 24-26 MG Take 1 tablet by mouth 2 (two) times daily. 60 tablet 6   spironolactone (ALDACTONE) 25 MG tablet Take 1 tablet (25 mg total) by mouth daily. 90 tablet 3   No current facility-administered medications for this encounter.    Vitals:   06/05/22 1008  BP: 110/70   Pulse: 78  SpO2: 98%  Weight: 99.7 kg (219 lb 12.8 oz)     PHYSICAL EXAM: General:  Well appearing. No resp difficulty HEENT: normal Neck: supple. no JVD. Carotids 2+ bilat; no bruits. No lymphadenopathy or thryomegaly appreciated. Cor: PMI nondisplaced. Regular rate &  rhythm. No rubs, gallops or murmurs. Wearing Lifevest Lungs: clear Abdomen: soft, nontender, nondistended. No hepatosplenomegaly. No bruits or masses. Good bowel sounds. Extremities: no cyanosis, clubbing, rash, edema Neuro: alert & orientedx3, cranial nerves grossly intact. moves all 4 extremities w/o difficulty. Affect pleasant    ASSESSMENT & PLAN:  1. 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 Cath complicated by severe vagal episode with near loss of pulse. - cMRI 11/22 - severe biventricular dysfunciton myocardial thinning and scar. LVEF 9% RVEF 2% - Concern for familial CM. Possible contribution from frequent PVCs, Father had CM in 75s and had LVAD as bridge to eventual transplant  - Echo 09/18/21: EF 15% RV moderately down. Severe MR  - CPX 2/23 Severe restrictive lung disease with severe HF limitation. pVO2 17.3 (37%) slope 40 RER: 1.15  VE/MVV:  98%  - Stable NYHA II-III. Volume ok  - Continue dig 0.125 - Continue Jardiance 10 - Continue spiro 25 daily  - Continue Entresto 24/26 bid - Failed Toprol due to low output symptoms - Blood Type O-pos - Offered ICD again but he wants to continue LifeVest  - Now listed for transplant at [facility] to warfarin    2. Severe MR -  functional due to severely dilated LV  - continue HF treatment - no change   3. Frequent PVCs - TSH normal  - improved on mexilitene - off amio due to live toxicity  - Zio 1/23: 11.4% PVCs  4. H/o CVA  - cardio-embolic in 19/14. Treated t-PA - continue  apixaban/statin  -> switch eliquis to warfarin  - no residual   Glori Bickers, MD  11:01 AM

## 2022-06-05 NOTE — Telephone Encounter (Signed)
[facility] is requesting call back to discuss this med change.

## 2022-06-10 ENCOUNTER — Ambulatory Visit: Payer: 59 | Attending: Cardiovascular Disease

## 2022-06-10 DIAGNOSIS — Z5181 Encounter for therapeutic drug level monitoring: Secondary | ICD-10-CM

## 2022-06-10 DIAGNOSIS — Z8673 Personal history of transient ischemic attack (TIA), and cerebral infarction without residual deficits: Secondary | ICD-10-CM

## 2022-06-10 DIAGNOSIS — Z7901 Long term (current) use of anticoagulants: Secondary | ICD-10-CM | POA: Insufficient documentation

## 2022-06-10 DIAGNOSIS — I824Y9 Acute embolism and thrombosis of unspecified deep veins of unspecified proximal lower extremity: Secondary | ICD-10-CM | POA: Insufficient documentation

## 2022-06-10 LAB — POCT INR: INR: 1.6 — AB (ref 2.0–3.0)

## 2022-06-10 NOTE — Patient Instructions (Signed)
TAKE 2 TABLETS TODAY ONLY and then continue 1 TABLET DAILY.  INR in 1 week.  A full discussion of the nature of anticoagulants has been carried out.  A benefit risk analysis has been presented to the patient, so that they understand the justification for choosing anticoagulation at this time. The need for frequent and regular monitoring, precise dosage adjustment and compliance is stressed.  Side effects of potential bleeding are discussed.  The patient should avoid any OTC items containing aspirin or ibuprofen, and should avoid great swings in general diet.  Avoid alcohol consumption.  Call if any signs of abnormal bleeding.  Next PT/INR test. [phone]

## 2022-06-12 ENCOUNTER — Emergency Department (HOSPITAL_COMMUNITY)
Admission: EM | Admit: 2022-06-12 | Discharge: 2022-06-13 | Disposition: A | Discharge status: Home / Self Care | Payer: 59 | Attending: Emergency Medicine | Admitting: Emergency Medicine

## 2022-06-12 ENCOUNTER — Other Ambulatory Visit: Payer: Self-pay

## 2022-06-12 DIAGNOSIS — Z7901 Long term (current) use of anticoagulants: Secondary | ICD-10-CM | POA: Diagnosis not present

## 2022-06-12 DIAGNOSIS — R55 Syncope and collapse: Secondary | ICD-10-CM | POA: Insufficient documentation

## 2022-06-12 DIAGNOSIS — E860 Dehydration: Secondary | ICD-10-CM | POA: Diagnosis not present

## 2022-06-13 ENCOUNTER — Encounter (HOSPITAL_COMMUNITY): Payer: Self-pay | Admitting: Emergency Medicine

## 2022-06-13 ENCOUNTER — Emergency Department (HOSPITAL_COMMUNITY): Payer: 59

## 2022-06-13 ENCOUNTER — Other Ambulatory Visit: Payer: Self-pay

## 2022-06-13 LAB — URINALYSIS, ROUTINE W REFLEX MICROSCOPIC
Bacteria, UA: NONE SEEN
Bilirubin Urine: NEGATIVE
Glucose, UA: >=500 mg/dL — AB
Hgb urine dipstick: NEGATIVE
Ketones, ur: 5 mg/dL — AB
Leukocytes,Ua: NEGATIVE
Nitrite: NEGATIVE
Protein, ur: NEGATIVE mg/dL
Specific Gravity, Urine: 1.02 (ref 1.005–1.030)
pH: 5 (ref 5.0–8.0)

## 2022-06-13 LAB — COMPREHENSIVE METABOLIC PANEL
ALT: 25 U/L (ref 0–44)
AST: 32 U/L (ref 15–41)
Albumin: 4.5 g/dL (ref 3.5–5.0)
Alkaline Phosphatase: 53 U/L (ref 38–126)
Anion gap: 13 (ref 5–15)
BUN: 19 mg/dL (ref 6–20)
CO2: 20 mmol/L — ABNORMAL LOW (ref 22–32)
Calcium: 9.8 mg/dL (ref 8.9–10.3)
Chloride: 105 mmol/L (ref 98–111)
Creatinine, Ser: 1.47 mg/dL — ABNORMAL HIGH (ref 0.61–1.24)
GFR, Estimated: >60 mL/min (ref >60)
Glucose, Bld: 87 mg/dL (ref 70–99)
Potassium: 3.8 mmol/L (ref 3.5–5.1)
Sodium: 138 mmol/L (ref 135–145)
Total Bilirubin: 1.2 mg/dL (ref 0.3–1.2)
Total Protein: 8.1 g/dL (ref 6.5–8.1)

## 2022-06-13 LAB — CBC WITH DIFFERENTIAL/PLATELET
Abs Immature Granulocytes: 0.02 10*3/uL (ref 0.00–0.07)
Basophils Absolute: 0 10*3/uL (ref 0.0–0.1)
Basophils Relative: 0 %
Eosinophils Absolute: 0 10*3/uL (ref 0.0–0.5)
Eosinophils Relative: 0 %
HCT: 54 % — ABNORMAL HIGH (ref 39.0–52.0)
Hemoglobin: 17.9 g/dL — ABNORMAL HIGH (ref 13.0–17.0)
Immature Granulocytes: 0 %
Lymphocytes Relative: 21 %
Lymphs Abs: 1.5 10*3/uL (ref 0.7–4.0)
MCH: 31.2 pg (ref 26.0–34.0)
MCHC: 33.1 g/dL (ref 30.0–36.0)
MCV: 94.1 fL (ref 80.0–100.0)
Monocytes Absolute: 0.5 10*3/uL (ref 0.1–1.0)
Monocytes Relative: 6 %
Neutro Abs: 5.1 10*3/uL (ref 1.7–7.7)
Neutrophils Relative %: 73 %
Platelets: 219 10*3/uL (ref 150–400)
RBC: 5.74 MIL/uL (ref 4.22–5.81)
RDW: 13 % (ref 11.5–15.5)
WBC: 7.1 10*3/uL (ref 4.0–10.5)
nRBC: 0 % (ref 0.0–0.2)

## 2022-06-13 LAB — PROTIME-INR
INR: 2 — ABNORMAL HIGH (ref 0.8–1.2)
Prothrombin Time: 22.6 seconds — ABNORMAL HIGH (ref 11.4–15.2)

## 2022-06-13 LAB — DIGOXIN LEVEL: Digoxin Level: 0.7 ng/mL — ABNORMAL LOW (ref 0.8–2.0)

## 2022-06-13 LAB — MAGNESIUM: Magnesium: 2.1 mg/dL (ref 1.7–2.4)

## 2022-06-13 NOTE — ED Triage Notes (Addendum)
Patient reports feeling lightheaded this evening and had a brief syncopal episode , denies head injury , no chest pain or SOB . Alert and oriented at arrival . Patient wears external pacemaker , his cardiologist is Dr. Missy Sabins .

## 2022-06-13 NOTE — ED Provider Triage Note (Signed)
Emergency Medicine Provider Triage Evaluation Note  Thomas Wood , a 26 y.o. male  was evaluated in triage.  Pt complains of syncopal event while cooking dinner.  Remembers getting lightheaded and passed out for a few seconds.  Family caught him, no head trauma.  Has life vest, awaiting heart transplant. Denies chest pain or SOB today.  Review of Systems  Positive: Syncopal event Negative: Chest pain  Physical Exam  BP 111/74   Pulse (!) 51   Temp 98.5 F (36.9 C) (Oral)   Resp 19   SpO2 100%   Gen:   Awake, no distress   Resp:  Normal effort  MSK:   Moves extremities without difficulty  Other:  Life vest in place; AAOx3, no focal deficits, no visible head trauma  Medical Decision Making  Medically screening exam initiated at 12:00 AM.  Appropriate orders placed.  Thomas Wood was informed that the remainder of the evaluation will be completed by another provider, this initial triage assessment does not replace that evaluation, and the importance of remaining in the ED until their evaluation is complete.  Syncope.  Known cardiac patient, awaiting heart transplant.  Has life vest in place.  Will check EKG, labs, CXR.   Larene Pickett, PA-C 06/13/22 0003

## 2022-06-13 NOTE — ED Provider Notes (Signed)
[facility] EMERGENCY DEPARTMENT Provider Note   CSN: [id number] Arrival date & time: 06/12/22  2254     History  Chief Complaint  Patient presents with   Syncope    Thomas Wood is a 26 y.o. male.  26 yo M here with syncopal episode. Has h/o end stage heart failure from cardiomyopathy with a life vest in place. States he hadn't ate or drank much today. Was standing for a couple hours walking around the house. Was cooking dinner, getting something out of the fridge and started to feel light headed like he was going to pass out. No chest pain, palpitations, back pain or other associated symptoms. Hasn't urinated in >18 hours.         Home Medications Prior to Admission medications   Medication Sig Start Date End Date Taking? Authorizing Provider  albuterol (VENTOLIN HFA) 108 (90 Base) MCG/ACT inhaler Inhale 2 puffs into the lungs every 6 (six) hours as needed for wheezing. 07/11/21   [provider]  cetirizine (ZYRTEC) 10 MG tablet Take 10 mg by mouth as needed for allergies.    [provider]  digoxin (LANOXIN) 0.125 MG tablet Take 1 tablet (0.125 mg total) by mouth daily. 02/24/22   Bensimhon, Shaune Pascal, MD  empagliflozin (JARDIANCE) 10 MG TABS tablet Take 1 tablet (10 mg total) by mouth daily. 06/05/22   Bensimhon, Shaune Pascal, MD  mexiletine (MEXITIL) 150 MG capsule Take 2 capsules Twice daily 06/05/22   Bensimhon, Shaune Pascal, MD  sacubitril-valsartan (ENTRESTO) 24-26 MG Take 1 tablet by mouth 2 (two) times daily. 03/21/22   Bensimhon, Shaune Pascal, MD  spironolactone (ALDACTONE) 25 MG tablet Take 1 tablet (25 mg total) by mouth daily. 02/11/22   Bensimhon, Shaune Pascal, MD  warfarin (COUMADIN) 5 MG tablet Take 1 tablet daily or as directed by Anticoagulation Clinic. 06/05/22   Bensimhon, Shaune Pascal, MD      Allergies    Amiodarone    Review of Systems   Review of Systems  Physical Exam Updated Vital Signs BP 103/72   Pulse 75   Temp 98.4 F  (36.9 C) (Oral)   Resp (!) 22   SpO2 100%  Physical Exam Vitals and nursing note reviewed.  Constitutional:      Appearance: He is well-developed.  HENT:     Head: Normocephalic and atraumatic.     Mouth/Throat:     Mouth: Mucous membranes are dry.  Eyes:     Pupils: Pupils are equal, round, and reactive to light.  Cardiovascular:     Rate and Rhythm: Normal rate.  Pulmonary:     Effort: Pulmonary effort is normal. No respiratory distress.  Abdominal:     General: Abdomen is flat. There is no distension.  Musculoskeletal:        General: Normal range of motion.     Cervical back: Normal range of motion.  Skin:    General: Skin is warm and dry.  Neurological:     General: No focal deficit present.     Mental Status: He is alert.     ED Results / Procedures / Treatments   Labs (all labs ordered are listed, but only abnormal results are displayed) Labs Reviewed  CBC WITH DIFFERENTIAL/PLATELET - Abnormal; Notable for the following components:      Result Value   Hemoglobin 17.9 (*)    HCT 54.0 (*)    All other components within normal limits  COMPREHENSIVE METABOLIC PANEL - Abnormal; Notable  for the following components:   CO2 20 (*)    Creatinine, Ser 1.47 (*)    All other components within normal limits  PROTIME-INR - Abnormal; Notable for the following components:   Prothrombin Time 22.6 (*)    INR 2.0 (*)    All other components within normal limits  DIGOXIN LEVEL - Abnormal; Notable for the following components:   Digoxin Level 0.7 (*)    All other components within normal limits  URINALYSIS, ROUTINE W REFLEX MICROSCOPIC - Abnormal; Notable for the following components:   Glucose, UA >=500 (*)    Ketones, ur 5 (*)    All other components within normal limits  MAGNESIUM    EKG EKG Interpretation  Date/Time:  Thursday June 12 2022 23:49:42 EDT Ventricular Rate:  71 PR Interval:  182 QRS Duration: 132 QT Interval:  412 QTC Calculation: 447 R  Axis:   243 Text Interpretation: Sinus rhythm with occasional Premature ventricular complexes Non-specific intra-ventricular conduction block Possible Anterolateral infarct , age undetermined Abnormal ECG When compared with ECG of 05-Jun-2022 10:16, PREVIOUS ECG IS PRESENT Confirmed by Merrily Pew [phone]) on 06/13/2022 5:42:38 AM  Radiology DG Chest 2 View  Result Date: 06/13/2022 CLINICAL DATA:  Syncope EXAM: CHEST - 2 VIEW COMPARISON:  07/11/2021 FINDINGS: Lungs are clear.  No pleural effusion or pneumothorax. The heart is top-normal in size. Visualized osseous structures are within normal limits. IMPRESSION: Normal chest radiographs. Electronically Signed   By: Julian Hy M.D.   On: 06/13/2022 00:36    Procedures Procedures    Medications Ordered in ED Medications - No data to display  ED Course/ Medical Decision Making/ A&P                           Medical Decision Making Amount and/or Complexity of Data Reviewed Labs: ordered.   D/w zoll, no arrhythmia events tonight.  K ok. Kidney function ok. INR therapeutic. Slightly hemoconcentrated.  Discussed briefly with Dr. Haroldine Laws who stated without an event on the lifevest, let him hydrate and would be ok for d/c.  Offered IVF to patient but he would rather hydrate orally instead.   Drank approximately 36 ounces of water without difficulty. Ate food. Ambulated. Urinated. Feels well orthostatic barely abnormal, feel hie stable for discharge home with continued hydration and cards follow up PRN.   Final Clinical Impression(s) / ED Diagnoses Final diagnoses:  Syncope, unspecified syncope type  Dehydration    Rx / DC Orders ED Discharge Orders     None         Tira Lafferty, Corene Cornea, MD 06/13/22 2327

## 2022-06-17 ENCOUNTER — Ambulatory Visit: Payer: 59 | Attending: Internal Medicine | Admitting: *Deleted

## 2022-06-17 DIAGNOSIS — Z8673 Personal history of transient ischemic attack (TIA), and cerebral infarction without residual deficits: Secondary | ICD-10-CM

## 2022-06-17 DIAGNOSIS — Z7901 Long term (current) use of anticoagulants: Secondary | ICD-10-CM | POA: Diagnosis not present

## 2022-06-17 LAB — POCT INR: INR: 3.4 — AB (ref 2.0–3.0)

## 2022-06-17 NOTE — Patient Instructions (Signed)
Description   Do not take any warfarin today then continue taking 1 tablet daily. Recheck INR in 1 week. Anticoagulation Clinic [phone]

## 2022-06-24 ENCOUNTER — Ambulatory Visit: Payer: 59 | Attending: Internal Medicine | Admitting: *Deleted

## 2022-06-24 DIAGNOSIS — Z7901 Long term (current) use of anticoagulants: Secondary | ICD-10-CM

## 2022-06-24 DIAGNOSIS — Z8673 Personal history of transient ischemic attack (TIA), and cerebral infarction without residual deficits: Secondary | ICD-10-CM | POA: Diagnosis not present

## 2022-06-24 LAB — POCT INR: INR: 3.9 — AB (ref 2.0–3.0)

## 2022-06-24 MED ORDER — WARFARIN SODIUM 5 MG PO TABS: ORAL_TABLET | ORAL | 1 refills | Status: DC

## 2022-06-24 NOTE — Patient Instructions (Signed)
Description   Do not take any warfarin today then start taking 1 tablet daily except 1/2 tablet of Saturdays. Recheck INR in 1 week. Anticoagulation Clinic [phone]

## 2022-06-26 ENCOUNTER — Inpatient Hospital Stay (HOSPITAL_COMMUNITY)
Admission: RE | Admit: 2022-06-26 | Discharge: 2022-06-26 | DRG: 309 | Disposition: A | Discharge status: Other Acute Inpatient Hospital | Payer: 59 | Source: Other Acute Inpatient Hospital | Attending: Internal Medicine | Admitting: Internal Medicine

## 2022-06-26 ENCOUNTER — Other Ambulatory Visit (HOSPITAL_COMMUNITY): Payer: Self-pay

## 2022-06-26 ENCOUNTER — Telehealth (HOSPITAL_COMMUNITY): Payer: Self-pay | Admitting: *Deleted

## 2022-06-26 ENCOUNTER — Inpatient Hospital Stay (HOSPITAL_COMMUNITY): Payer: 59

## 2022-06-26 ENCOUNTER — Encounter (HOSPITAL_COMMUNITY): Payer: Self-pay

## 2022-06-26 DIAGNOSIS — K219 Gastro-esophageal reflux disease without esophagitis: Secondary | ICD-10-CM | POA: Diagnosis present

## 2022-06-26 DIAGNOSIS — Z803 Family history of malignant neoplasm of breast: Secondary | ICD-10-CM

## 2022-06-26 DIAGNOSIS — Z87891 Personal history of nicotine dependence: Secondary | ICD-10-CM

## 2022-06-26 DIAGNOSIS — Z841 Family history of disorders of kidney and ureter: Secondary | ICD-10-CM

## 2022-06-26 DIAGNOSIS — Z8674 Personal history of sudden cardiac arrest: Secondary | ICD-10-CM

## 2022-06-26 DIAGNOSIS — J45909 Unspecified asthma, uncomplicated: Secondary | ICD-10-CM | POA: Diagnosis present

## 2022-06-26 DIAGNOSIS — I4901 Ventricular fibrillation: Secondary | ICD-10-CM | POA: Diagnosis present

## 2022-06-26 DIAGNOSIS — Z825 Family history of asthma and other chronic lower respiratory diseases: Secondary | ICD-10-CM

## 2022-06-26 DIAGNOSIS — I493 Ventricular premature depolarization: Principal | ICD-10-CM | POA: Diagnosis present

## 2022-06-26 DIAGNOSIS — Z823 Family history of stroke: Secondary | ICD-10-CM

## 2022-06-26 DIAGNOSIS — Z8249 Family history of ischemic heart disease and other diseases of the circulatory system: Secondary | ICD-10-CM | POA: Diagnosis not present

## 2022-06-26 DIAGNOSIS — Z888 Allergy status to other drugs, medicaments and biological substances status: Secondary | ICD-10-CM

## 2022-06-26 DIAGNOSIS — Z8241 Family history of sudden cardiac death: Secondary | ICD-10-CM | POA: Diagnosis not present

## 2022-06-26 DIAGNOSIS — Z833 Family history of diabetes mellitus: Secondary | ICD-10-CM | POA: Diagnosis not present

## 2022-06-26 DIAGNOSIS — Z809 Family history of malignant neoplasm, unspecified: Secondary | ICD-10-CM | POA: Diagnosis not present

## 2022-06-26 DIAGNOSIS — Z7682 Awaiting organ transplant status: Secondary | ICD-10-CM | POA: Diagnosis not present

## 2022-06-26 DIAGNOSIS — I428 Other cardiomyopathies: Secondary | ICD-10-CM | POA: Diagnosis present

## 2022-06-26 DIAGNOSIS — I5022 Chronic systolic (congestive) heart failure: Secondary | ICD-10-CM

## 2022-06-26 DIAGNOSIS — I10 Essential (primary) hypertension: Secondary | ICD-10-CM | POA: Diagnosis present

## 2022-06-26 DIAGNOSIS — Z8673 Personal history of transient ischemic attack (TIA), and cerebral infarction without residual deficits: Secondary | ICD-10-CM

## 2022-06-26 LAB — BASIC METABOLIC PANEL
Anion gap: 13 (ref 5–15)
BUN: 17 mg/dL (ref 6–20)
CO2: 21 mmol/L — ABNORMAL LOW (ref 22–32)
Calcium: 9.4 mg/dL (ref 8.9–10.3)
Chloride: 106 mmol/L (ref 98–111)
Creatinine, Ser: 1.41 mg/dL — ABNORMAL HIGH (ref 0.61–1.24)
GFR, Estimated: >60 mL/min (ref >60)
Glucose, Bld: 87 mg/dL (ref 70–99)
Potassium: 4 mmol/L (ref 3.5–5.1)
Sodium: 140 mmol/L (ref 135–145)

## 2022-06-26 LAB — CBC WITH DIFFERENTIAL/PLATELET
Abs Immature Granulocytes: 0.03 10*3/uL (ref 0.00–0.07)
Basophils Absolute: 0 10*3/uL (ref 0.0–0.1)
Basophils Relative: 0 %
Eosinophils Absolute: 0 10*3/uL (ref 0.0–0.5)
Eosinophils Relative: 0 %
HCT: 48.8 % (ref 39.0–52.0)
Hemoglobin: 16.1 g/dL (ref 13.0–17.0)
Immature Granulocytes: 0 %
Lymphocytes Relative: 20 %
Lymphs Abs: 1.4 10*3/uL (ref 0.7–4.0)
MCH: 30.7 pg (ref 26.0–34.0)
MCHC: 33 g/dL (ref 30.0–36.0)
MCV: 93.1 fL (ref 80.0–100.0)
Monocytes Absolute: 0.5 10*3/uL (ref 0.1–1.0)
Monocytes Relative: 7 %
Neutro Abs: 5 10*3/uL (ref 1.7–7.7)
Neutrophils Relative %: 73 %
Platelets: 303 10*3/uL (ref 150–400)
RBC: 5.24 MIL/uL (ref 4.22–5.81)
RDW: 12.4 % (ref 11.5–15.5)
WBC: 7 10*3/uL (ref 4.0–10.5)
nRBC: 0 % (ref 0.0–0.2)

## 2022-06-26 LAB — MAGNESIUM: Magnesium: 2 mg/dL (ref 1.7–2.4)

## 2022-06-26 LAB — ECHOCARDIOGRAM COMPLETE
Area-P 1/2: 5.13 cm2
MV M vel: 4.65 m/s
MV Peak grad: 86.5 mmHg
S' Lateral: 7.8 cm
Weight: 3513.25 oz

## 2022-06-26 LAB — PROTIME-INR
INR: 2.4 — ABNORMAL HIGH (ref 0.8–1.2)
Prothrombin Time: 25.7 seconds — ABNORMAL HIGH (ref 11.4–15.2)

## 2022-06-26 LAB — BRAIN NATRIURETIC PEPTIDE: B Natriuretic Peptide: 993.3 pg/mL — ABNORMAL HIGH (ref 0.0–100.0)

## 2022-06-26 LAB — TSH: TSH: 2.347 u[IU]/mL (ref 0.350–4.500)

## 2022-06-26 MED ORDER — MEXILETINE HCL 150 MG PO CAPS: 300.0000 mg | ORAL_CAPSULE | Freq: Two times a day (BID) | ORAL | Status: DC

## 2022-06-26 MED ORDER — DIGOXIN 125 MCG PO TABS
0.1250 mg | ORAL_TABLET | Freq: Every day | ORAL | Status: DC
  Administered 2022-06-26: 0.125 mg via ORAL
  Filled 2022-06-26: qty 1

## 2022-06-26 MED ORDER — ACETAMINOPHEN 325 MG PO TABS: 650.0000 mg | ORAL_TABLET | ORAL | Status: DC | PRN

## 2022-06-26 MED ORDER — SODIUM CHLORIDE 0.9 % IV SOLN: 250.0000 mL | INTRAVENOUS | Status: DC | PRN

## 2022-06-26 MED ORDER — ALBUTEROL SULFATE HFA 108 (90 BASE) MCG/ACT IN AERS: 2.0000 | INHALATION_SPRAY | Freq: Four times a day (QID) | RESPIRATORY_TRACT | Status: DC | PRN

## 2022-06-26 MED ORDER — MEXILETINE HCL 150 MG PO CAPS
150.0000 mg | ORAL_CAPSULE | Freq: Two times a day (BID) | ORAL | Status: DC
  Administered 2022-06-26: 150 mg via ORAL
  Filled 2022-06-26: qty 1

## 2022-06-26 MED ORDER — CHLORHEXIDINE GLUCONATE CLOTH 2 % EX PADS
6.0000 | MEDICATED_PAD | Freq: Every day | CUTANEOUS | Status: DC
  Administered 2022-06-26: 6 via TOPICAL

## 2022-06-26 MED ORDER — MEXILETINE HCL 150 MG PO CAPS
150.0000 mg | ORAL_CAPSULE | Freq: Once | ORAL | Status: AC
  Administered 2022-06-26: 150 mg via ORAL
  Filled 2022-06-26: qty 1

## 2022-06-26 MED ORDER — ONDANSETRON HCL 4 MG/2ML IJ SOLN: 4.0000 mg | Freq: Four times a day (QID) | INTRAMUSCULAR | Status: DC | PRN

## 2022-06-26 MED ORDER — ALBUTEROL SULFATE (2.5 MG/3ML) 0.083% IN NEBU: 2.5000 mg | INHALATION_SOLUTION | Freq: Four times a day (QID) | RESPIRATORY_TRACT | Status: DC | PRN

## 2022-06-26 MED ORDER — PERFLUTREN LIPID MICROSPHERE: 1.0000 mL | INTRAVENOUS | Status: AC | PRN

## 2022-06-26 NOTE — Telephone Encounter (Signed)
Per Dr.Bensimhon pt needs to be admitted two episodes of VF spontaneously went back into rhythm. Patient is wearing Zoll life vest but was not shocked. Dr.Bensimhon spoke directly to patient pt to admitted to [facility] once a bed becomes available. Pt will need to be transported to [facility] to admitted until transplant.

## 2022-06-26 NOTE — Progress Notes (Signed)
Pt Alertx4. No c/o of any pian or any resp distress noted. VSS. Pt is transfer with [facility]. Report given to Eather Colas RN room [phone] at [facility].

## 2022-06-26 NOTE — Discharge Summary (Signed)
Advanced Heart Failure Team  Discharge Summary   Patient ID: Thomas Wood MRN: [id number], DOB/AGE: 1995-12-25 26 y.o. Admit date: 06/26/2022 D/C date:     06/26/2022   Primary Discharge Diagnoses:  1. VF 2. Chronic HFrEF 3. Severe MR 4. H/O CVA  Hospital Course:   Thomas Wood is a 26 y/o male with h/o obesity , NICM, HFrEF, PVCs, and VF. 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    Admitted 07/11/21 with acute systolic HF EF [id number] with LVIDd > 7cm.   R/LHC normal coronary arteries and low output, CI 1.7. Complicated by severe vagal event and near arrest in the cath lab, resuscitated with epinephrine, NE and IVF. cMRI showed severe biventricular dysfunction w/ severe LV dilation and globally elevated ECV signal, w/ myocardial thinning and scar c/w a chronic process. LVEF 9%. RVEF 2% No LV thrombus. Had high PVC burden  >20%. Started on mexiletine with dose increased to 300 mg twice a day . PVC burden reduced. Discharged home w/ LifeVest.    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 Treated with t-PA with complete resolution of symptoms. Cerebral arteriogram with no clot. Repeat echo EF <20% RV moderately down. No frank LV clot. Apixaban started. Had recurrent PVCs so amio added to mexilitene for PVC suppression. BP low so midodrine added and losartan held     Completed transplant evaluation in September. Listed earlier this month. Taken off eliquis and placed on warfarin 06/05/22.      He was seen in the ED 06/12/22 with syncope. Life Vest interrogation without events. Discharged home the same day. Over the last 48 hours he had at least 3 episodes of presyncope associated with rapid heart rate and dyspnea. Life Vest interrgation showed 3 episodes of VF that spontaneously broke. He was instructed to go to the hospital for admit.   Direct admit to [facility] ICU. Dr Haroldine Laws contacted Dr Mosetta Pigeon at [facility] with  recommendations to transfer to [facility].  Labs obtained with stable K/Mag. HF Meds held due to soft BP.  On exam appeared warm and dry.   Transfer to [facility] via [place] with Dr Mosetta Pigeon as accepting MD.   Discharge Vitals: Blood pressure 96/71, pulse 84, temperature 98.6 F (37 C), temperature source Oral, resp. rate 12, weight 99.6 kg, SpO2 98 %.  Labs: Lab Results  Component Value Date   WBC 7.0 06/26/2022   HGB 16.1 06/26/2022   HCT 48.8 06/26/2022   MCV 93.1 06/26/2022   PLT 303 06/26/2022    Recent Labs  Lab 06/26/22 1350  NA 140  K 4.0  CL 106  CO2 21*  BUN 17  CREATININE 1.41*  CALCIUM 9.4  GLUCOSE 87   Lab Results  Component Value Date   CHOL 159 07/24/2021   HDL 35 (L) 07/24/2021   LDLCALC 114 (H) 07/24/2021   TRIG 50 07/24/2021   BNP (last 3 results) Recent Labs    12/19/21 1249 03/21/22 1159 06/26/22 1350  BNP 845.8* 518.0* 993.3*    ProBNP (last 3 results) No results for input(s): "PROBNP" in the last 8760 hours.   Diagnostic Studies/Procedures   ECHOCARDIOGRAM COMPLETE  Result Date: 06/26/2022    ECHOCARDIOGRAM REPORT   Patient Name:   Thomas Wood Date of Exam: 06/26/2022 Medical Rec #:  [id number]         Height:       71.0 in Accession #:  [id number]        Weight:       219.8 lb Date of Birth:  Oct 09, 1995          BSA:          2.195 m Patient Age:    26 years          BP:           128/101 mmHg Patient Gender: M                 HR:           75 bpm. Exam Location:  Inpatient Procedure: 2D Echo, Cardiac Doppler, Color Doppler and Intracardiac            Opacification Agent Indications:    congestive heart failure  History:        Patient has prior history of Echocardiogram examinations, most                 recent 04/03/2022. Cardiomyopathy, chronic kidney disease;                 Arrythmias:PVC and Ventricular Fibrillation.  Sonographer:    Johny Chess RDCS Referring Phys: [phone] AMY D CLEGG IMPRESSIONS  1. Left ventricular ejection fraction,  by estimation, is <20%. The left ventricle has severely decreased function. The left ventricle demonstrates global hypokinesis. The left ventricular internal cavity size was severely dilated. Left ventricular diastolic parameters are consistent with Grade II diastolic dysfunction (pseudonormalization).  2. Right ventricular systolic function is normal. The right ventricular size is normal.  3. Left atrial size was severely dilated.  4. The mitral valve is normal in structure. Mild to moderate mitral valve regurgitation. No evidence of mitral stenosis.  5. The aortic valve is normal in structure. Aortic valve regurgitation is not visualized. No aortic stenosis is present.  6. The inferior vena cava is dilated in size with <50% respiratory variability, suggesting right atrial pressure of 15 mmHg. FINDINGS  Left Ventricle: Left ventricular ejection fraction, by estimation, is <20%. The left ventricle has severely decreased function. The left ventricle demonstrates global hypokinesis. Definity contrast agent was given IV to delineate the left ventricular endocardial borders. The left ventricular internal cavity size was severely dilated. There is no left ventricular hypertrophy. Left ventricular diastolic parameters are consistent with Grade II diastolic dysfunction (pseudonormalization). Right Ventricle: The right ventricular size is normal. No increase in right ventricular wall thickness. Right ventricular systolic function is normal. Left Atrium: Left atrial size was severely dilated. Right Atrium: Right atrial size was normal in size. Pericardium: There is no evidence of pericardial effusion. Mitral Valve: The mitral valve is normal in structure. Mild to moderate mitral valve regurgitation. No evidence of mitral valve stenosis. Tricuspid Valve: The tricuspid valve is normal in structure. Tricuspid valve regurgitation is mild . No evidence of tricuspid stenosis. Aortic Valve: The aortic valve is normal in structure.  Aortic valve regurgitation is not visualized. No aortic stenosis is present. Pulmonic Valve: The pulmonic valve was normal in structure. Pulmonic valve regurgitation is not visualized. No evidence of pulmonic stenosis. Aorta: The aortic root is normal in size and structure. Venous: The inferior vena cava is dilated in size with less than 50% respiratory variability, suggesting right atrial pressure of 15 mmHg. IAS/Shunts: No atrial level shunt detected by color flow Doppler.  LEFT VENTRICLE PLAX 2D LVIDd:         8.20 cm   Diastology LVIDs:  7.80 cm   LV e' medial:    6.74 cm/s LV PW:         0.90 cm   LV E/e' medial:  12.7 LV IVS:        0.90 cm   LV e' lateral:   14.00 cm/s LVOT diam:     1.80 cm   LV E/e' lateral: 6.1 LV SV:         34 LV SV Index:   16 LVOT Area:     2.54 cm  RIGHT VENTRICLE            IVC RV S prime:     9.68 cm/s  IVC diam: 2.20 cm TAPSE (M-mode): 2.0 cm LEFT ATRIUM              Index        RIGHT ATRIUM           Index LA diam:        4.60 cm  2.10 cm/m   RA Area:     14.30 cm LA Vol (A2C):   124.0 ml 56.50 ml/m  RA Volume:   26.70 ml  12.16 ml/m LA Vol (A4C):   88.3 ml  40.23 ml/m LA Biplane Vol: 109.0 ml 49.66 ml/m  AORTIC VALVE LVOT Vmax:   85.80 cm/s LVOT Vmean:  56.000 cm/s LVOT VTI:    0.135 m  AORTA Ao Root diam: 2.90 cm MITRAL VALVE MV Area (PHT): 5.13 cm    SHUNTS MV Decel Time: 148 msec    Systemic VTI:  0.14 m MR Peak grad: 86.5 mmHg    Systemic Diam: 1.80 cm MR Mean grad: 54.0 mmHg MR Vmax:      465.00 cm/s MR Vmean:     351.0 cm/s MV E velocity: 85.70 cm/s MV A velocity: 45.00 cm/s MV E/A ratio:  1.90 Kardie Tobb DO Electronically signed by Berniece Salines DO Signature Date/Time: 06/26/2022/3:27:52 PM    Final     Discharge Medications   Allergies as of 06/26/2022       Reactions   Amiodarone Other (See Comments)   Hepatotoxicity and hyperthyroidism Abnormal LFTs        Medication List     STOP taking these medications    empagliflozin 10 MG Tabs  tablet Commonly known as: JARDIANCE   Entresto 24-26 MG Generic drug: sacubitril-valsartan   spironolactone 25 MG tablet Commonly known as: ALDACTONE       TAKE these medications    albuterol 108 (90 Base) MCG/ACT inhaler Commonly known as: VENTOLIN HFA Inhale 2 puffs into the lungs every 6 (six) hours as needed for wheezing or shortness of breath.   cetirizine 10 MG tablet Commonly known as: ZYRTEC Take 10 mg by mouth daily as needed (for seasonal allergies).   digoxin 0.125 MG tablet Commonly known as: LANOXIN Take 1 tablet (0.125 mg total) by mouth daily.   mexiletine 150 MG capsule Commonly known as: MEXITIL Take 2 capsules Twice daily What changed:  how much to take how to take this when to take this additional instructions   warfarin 5 MG tablet Commonly known as: COUMADIN Take as directed. If you are unsure how to take this medication, talk to your nurse or doctor. Original instructions: Take 1 tablet daily or as directed by Anticoagulation Clinic. What changed:  how much to take how to take this when to take this        Disposition   The patient will be discharged  in stable condition to [facility] via [facility]       Duration of Discharge Encounter: Greater than 35 minutes   Signed, Darrick Grinder, NP-C 06/26/2022, 5:07 PM   Patient seen and examined with the above-signed Advanced Practice Provider and/or Housestaff. I personally reviewed laboratory data, imaging studies and relevant notes. I independently examined the patient and formulated the important aspects of the plan. I have edited the note to reflect any of my changes or salient points. I have personally discussed the plan with the patient and/or family.  Awaiting transfer to [facility] for OHTx. Watch for recurrent dysrhythmias.   Glori Bickers, MD  5:07 PM

## 2022-06-26 NOTE — H&P (Signed)
Advanced Heart Failure Team History and Physical Note   PCP:  Haydee Salter, MD  PCP-Cardiology: Dr Haroldine Laws  [facility]: Dr Mosetta Pigeon  Reason for Admission: VF   HPI:    Thomas Wood is a 26 y/o male with h/o obesity , NICM, HFrEF, PVCs, and VF.   Admitted 07/11/21 with acute systolic HF EF [id number] with LVIDd > 7cm. Father (Thomas Wood) with h/o cardiac arrest/HF due to NICM and has undergone VAD placement (here) and OHTx at [facility].    R/LHC normal coronary arteries and low output, CI 1.7. Complicated by severe vagal event and near arrest in the cath lab, resuscitated with epinephrine, NE and IVF. cMRI showed severe biventricular dysfunction w/ severe LV dilation and globally elevated ECV signal, w/ myocardial thinning and scar c/w a chronic process. LVEF 9%. RVEF 2% No LV thrombus. Had high PVC burden  >20%. Started on mexiletine with dose increased to 300 mg twice a day . PVC burden reduced. Discharged home w/ LifeVest.    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 Treated with t-PA with complete resolution of symptoms. Cerebral arteriogram with no clot. Repeat echo EF <20% RV moderately down. No frank LV clot. Apixaban started. Had recurrent PVCs so amio added to mexilitene for PVC suppression. BP low so midodrine added and losartan held     Seen at [facility] and now listed for transplant. Off amio due to liver toxicity.  Saw Dr Haroldine Laws earlier this month. High PVC burden.   He was seen in the ED 06/12/22 with syncope. Life Vest interrogation without events. Discharged home the same day.   Over the last 48 hours he had a couple episodes of presyncope associated with fast heart rate and dyspnea. Life Vest interrgation showed 2 episodes of VF. Dr Haroldine Laws contacted Dr Zena Amos.  Recommended admit to [facility] however he was unable to safely get to [facility].   Direct admit to [facility] ICU.    Review of Systems: [y] = yes, [ ]  = no   General: Weight gain [ ] ; Weight loss [ ] ; Anorexia [ ] ;  Fatigue [ Y]; Fever [ ] ; Chills [ ] ; Weakness [ ]   Cardiac: Chest pain/pressure [ ] ; Resting SOB [ ] ; Exertional SOB [ ] ; Orthopnea [ ] ; Pedal Edema [ ] ; Palpitations [ ] ; Syncope [ ] ; Presyncope [ ] ; Paroxysmal nocturnal dyspnea[ ]   Pulmonary: Cough [ ] ; Wheezing[ ] ; Hemoptysis[ ] ; Sputum [ ] ; Snoring [ ]   GI: Vomiting[ ] ; Dysphagia[ ] ; Melena[ ] ; Hematochezia [ ] ; Heartburn[ ] ; Abdominal pain [ ] ; Constipation [ ] ; Diarrhea [ ] ; BRBPR [ ]   GU: Hematuria[ ] ; Dysuria [ ] ; Nocturia[ ]   Vascular: Pain in legs with walking [ ] ; Pain in feet with lying flat [ ] ; Non-healing sores [ ] ; Stroke [ ] ; TIA [ ] ; Slurred speech [ ] ;  Neuro: Headaches[ ] ; Vertigo[ ] ; Seizures[ ] ; Paresthesias[ ] ;Blurred vision [ ] ; Diplopia [ ] ; Vision changes [ ]   Ortho/Skin: Arthritis [ ] ; Joint pain [Y ]; Muscle pain [ ] ; Joint swelling [ ] ; Back Pain [ ] ; Rash [ ]   Psych: Depression[ ] ; Anxiety[ ]   Heme: Bleeding problems [ ] ; Clotting disorders [ ] ; Anemia [ ]   Endocrine: Diabetes [ ] ; Thyroid dysfunction[ ]    Home Medications Prior to Admission medications   Medication Sig Start Date End Date Taking? Authorizing Provider  albuterol (VENTOLIN HFA) 108 (90 Base) MCG/ACT inhaler Inhale 2 puffs into the lungs every 6 (six) hours as  needed for wheezing. 07/11/21   [provider]  cetirizine (ZYRTEC) 10 MG tablet Take 10 mg by mouth as needed for allergies.    [provider]  digoxin (LANOXIN) 0.125 MG tablet Take 1 tablet (0.125 mg total) by mouth daily. 02/24/22   [name], Shaune Pascal, MD  empagliflozin (JARDIANCE) 10 MG TABS tablet Take 1 tablet (10 mg total) by mouth daily. 06/05/22   [name], Shaune Pascal, MD  mexiletine (MEXITIL) 150 MG capsule Take 2 capsules Twice daily 06/05/22   [name], Shaune Pascal, MD  sacubitril-valsartan (ENTRESTO) 24-26 MG Take 1 tablet by mouth 2 (two) times daily. 03/21/22   [name], Shaune Pascal, MD  spironolactone (ALDACTONE) 25 MG tablet Take 1 tablet (25 mg total) by mouth  daily. 02/11/22   [name], Shaune Pascal, MD  warfarin (COUMADIN) 5 MG tablet Take 1 tablet daily or as directed by Anticoagulation Clinic. 06/24/22   [name], Shaune Pascal, MD    Past Medical History: Past Medical History:  Diagnosis Date   Asthma    Cerebrovascular accident (CVA) due to thrombosis of basilar artery ([place]) 07/24/2021   Chronic kidney disease    stones   GERD (gastroesophageal reflux disease)    New onset of congestive heart failure ([place]) 07/11/2021    Past Surgical History: Past Surgical History:  Procedure Laterality Date   ARTERIAL LINE INSERTION N/A 07/12/2021   Procedure: ARTERIAL LINE INSERTION;  Surgeon: Jolaine Artist, MD;  Location: [place] [facility];  Service: Cardiovascular;  Laterality: N/A;   CENTRAL LINE INSERTION  07/12/2021   Procedure: CENTRAL LINE INSERTION;  Surgeon: Jolaine Artist, MD;  Location: [place] [facility];  Service: Cardiovascular;;   IR ANGIO INTRA EXTRACRAN SEL COM CAROTID INNOMINATE BILAT MOD SED  07/24/2021   IR ANGIO VERTEBRAL SEL VERTEBRAL BILAT MOD SED  07/24/2021   RADIOLOGY WITH ANESTHESIA N/A 07/24/2021   Procedure: RADIOLOGY WITH ANESTHESIA;  Surgeon: Luanne Bras, MD;  Location: [place];  Service: Radiology;  Laterality: N/A;   RIGHT/LEFT HEART CATH AND CORONARY ANGIOGRAPHY N/A 07/12/2021   Procedure: RIGHT/LEFT HEART CATH AND CORONARY ANGIOGRAPHY;  Surgeon: Jolaine Artist, MD;  Location: [place] [facility];  Service: Cardiovascular;  Laterality: N/A;    Family History:  Family History  Problem Relation Age of Onset   Diabetes Mother    Asthma Mother    Cancer Mother        breast   Miscarriages / Stillbirths Mother    Kidney disease Mother        stones   Heart disease Father 68       MI, heart transplant   Diabetes Father    Heart failure Father        LVAD>>Heart Transplant (31s)   Asthma Brother    Diabetes Maternal Grandmother    Cancer Maternal Grandmother        Breast   Kidney disease  Maternal Grandmother    Stroke Maternal Grandmother    Diabetes Paternal Grandmother    Cancer Paternal Grandfather     Social History: Social History   Socioeconomic History   Marital status: Significant Other    Spouse name: Not on file   Number of children: Not on file   Years of education: Not on file   Highest education level: Not on file  Occupational History   Not on file  Tobacco Use   Smoking status: Former    Types: Cigarettes    Quit date: 2018    Years  since quitting: 5.8   Smokeless tobacco: Never  Vaping Use   Vaping Use: Never used  Substance and Sexual Activity   Alcohol use: No   Drug use: No   Sexual activity: Yes    Birth control/protection: Pill  Other Topics Concern   Not on file  Social History Narrative   Not on file   Social Determinants of Health   Financial Resource Strain: Low Risk  (08/12/2021)   Overall Financial Resource Strain (CARDIA)    Difficulty of Paying Living Expenses: Not hard at all  Food Insecurity: No Food Insecurity (08/12/2021)   Hunger Vital Sign    Worried About Running Out of Food in the Last Year: Never true    Ran Out of Food in the Last Year: Never true  Transportation Needs: No Transportation Needs (08/12/2021)   PRAPARE - Hydrologist (Medical): No    Lack of Transportation (Non-Medical): No  Physical Activity: Not on file  Stress: No Stress Concern Present (08/12/2021)   [place]    Feeling of Stress : Not at all  Social Connections: Moderately Integrated (08/12/2021)   Social Connection and Isolation Panel [NHANES]    Frequency of Communication with Friends and Family: Three times a week    Frequency of Social Gatherings with Friends and Family: Three times a week    Attends Religious Services: 1 to 4 times per year    Active Member of Clubs or Organizations: Yes    Attends Archivist Meetings: 1 to 4  times per year    Marital Status: Never married    Allergies:  Allergies  Allergen Reactions   Amiodarone Other (See Comments)    Hepatotoxicity and hyperthyroidism    Objective:    Vital Signs:       There were no vitals filed for this visit.   Physical Exam     General:  No respiratory difficulty HEENT: Normal Neck: Supple. no JVD. Carotids 2+ bilat; no bruits. No lymphadenopathy or thyromegaly appreciated. Cor: PMI nondisplaced. Regular rate & rhythm. No rubs, gallops or murmurs. Lungs: Clear Abdomen: Soft, nontender, nondistended. No hepatosplenomegaly. No bruits or masses. Good bowel sounds. Extremities: No cyanosis, clubbing, rash, edema Neuro: Alert & oriented x 3, cranial nerves grossly intact. moves all 4 extremities w/o difficulty. Affect pleasant.   Telemetry   SR with PVCs 80s   EKG   N/A Labs     Basic Metabolic Panel: No results for input(s): "NA", "K", "CL", "CO2", "GLUCOSE", "BUN", "CREATININE", "CALCIUM", "MG", "PHOS" in the last 168 hours.  Liver Function Tests: No results for input(s): "AST", "ALT", "ALKPHOS", "BILITOT", "PROT", "ALBUMIN" in the last 168 hours. No results for input(s): "LIPASE", "AMYLASE" in the last 168 hours. No results for input(s): "AMMONIA" in the last 168 hours.  CBC: No results for input(s): "WBC", "NEUTROABS", "HGB", "HCT", "MCV", "PLT" in the last 168 hours.  Cardiac Enzymes: No results for input(s): "CKTOTAL", "CKMB", "CKMBINDEX", "TROPONINI" in the last 168 hours.  BNP: BNP (last 3 results) Recent Labs    10/14/21 1456 12/19/21 1249 03/21/22 1159  BNP 950.9* 845.8* 518.0*    ProBNP (last 3 results) No results for input(s): "PROBNP" in the last 8760 hours.   CBG: No results for input(s): "GLUCAP" in the last 168 hours.  Coagulation Studies: Recent Labs    06/24/22 1129  INR 3.9*    Imaging: No results found.   Patient Profile  Thomas Wood is a 26 y/o male with h/o obesity , NICM, HFrEF, PVCs,  and VF.   Admitted VF. P Assessment/Plan  1. VF - Prior to admi wearing Life Vest. Multiple episodes VF that spontaneously broke 10/18, 10/24, and 10/25.  -Continue Mexiletine 300 mg twice a day  - No on amio due to liver toxicity - Place Zoll Pads.  - Check labs.   2. Chronic HFrEF Echo 11/22 EF <20%, RV moderately reduced, global HK (no prior study for comparison) - Cath 11/22 Normal cors with distal LAD bridge. Cath complicated by severe vagal episode with near loss of pulse. - cMRI 11/22 - severe biventricular dysfunciton myocardial thinning and scar. LVEF 9% RVEF 2% - Concern for familial CM. Possible contribution from frequent PVCs, Father had CM in 63s and had LVAD as bridge to eventual transplant  - Echo 09/18/21: EF 15% RV moderately down. Severe MR  - CPX 2/23 Severe restrictive lung disease with severe HF limitation. pVO2 17.3 (37%) slope 40 RER: 1.15  - Repeat ECHO  - Volume status ok  - Hold HF Meds. Check labs.   3. Severe MR - Functional in the setting of severely dilated LV  4. H/O CVA cardio-embolic in [id number]. Treated t-PA On coumadin  Transfer to [facility] once stabilized.   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 Has had 2 episodes of presyncope over last 48 hours LifeVest interrogation shows self-terminated VF.   Denies CP, edema, orthopnea or PND. Currently in NSR  General:  Sitting in bed. No resp difficulty HEENT: normal Neck: supple. no JVD. Carotids 2+ bilat; no bruits. No lymphadenopathy or thryomegaly appreciated. Cor: PMI laterally displaced. Regular rate & rhythm. No rubs, gallops or murmurs. Lungs: clear Abdomen: soft, nontender, nondistended. No hepatosplenomegaly. No bruits or masses. Good  bowel sounds. Extremities: no cyanosis, clubbing, rash, edema Neuro: alert & orientedx3, cranial nerves grossly intact. moves all 4 extremities w/o difficulty. Affect pleasant  He has had 2 episodes of spontaneously aborted SCD. Check labs. Place on Zoll. Hold warfarin. Start heparin when INR < 2.0. plan transfer to [facility] to await transplant when bed available. D/w [facility]plant team.   CRITICAL CARE Performed by: Glori Bickers  Total critical care time: 40  minutes  Critical care time was exclusive of separately billable procedures and treating other patients.  Critical care was necessary to treat or prevent imminent or life-threatening deterioration.  Critical care was time spent personally by me (independent of midlevel providers or residents) on the following activities: development of treatment plan with patient and/or surrogate as well as nursing, discussions with consultants, evaluation of patient's response to treatment, examination of patient, obtaining history from patient or surrogate, ordering and performing treatments and interventions, ordering and review of laboratory studies, ordering and review of radiographic studies, pulse oximetry and re-evaluation of patient's condition.  Glori Bickers, MD  1:36 PM

## 2022-06-26 NOTE — Progress Notes (Addendum)
Transport to [facility] [phone] set up via [facility] [phone]). Scheduled to arrive for pick up shortly after 1900.

## 2022-06-26 NOTE — Progress Notes (Addendum)
ANTICOAGULATION CONSULT NOTE  Pharmacy Consult for heparin Indication:  hx CVA  Allergies  Allergen Reactions   Amiodarone Other (See Comments)    Hepatotoxicity and hyperthyroidism    Patient Measurements:    Vital Signs:    Labs: Recent Labs    06/24/22 1129  INR 3.9*    CrCl cannot be calculated (Unknown ideal weight.).   Medical History: Past Medical History:  Diagnosis Date   Asthma    Cerebrovascular accident (CVA) due to thrombosis of basilar artery ([place]) 07/24/2021   Chronic kidney disease    stones   GERD (gastroesophageal reflux disease)    New onset of congestive heart failure ([place]) 07/11/2021     Assessment: 89 yoM admitted with VF. Pt on warfarin PTA for hx basilar artery CVA. Warfarin recently transitioned from apixaban. INR elevated in coag clinic 10/24 at 3.9 and warfarin regimen reduced to 5mg  daily except 2.5mg  Sat.  Pharmacy asked to start IV heparin once INR <2.  Goal of Therapy:  Heparin level 0.3-0.7 units/ml Monitor platelets by anticoagulation protocol: Yes   Plan:  Check INR Start heparin once INR <2  ADDENDUM 1520: INR 2.4 hold warfarin, check am INR.  Arrie Senate, PharmD, BCPS, [facility] Clinical Pharmacist [phone] Please check AMION for all [place] numbers 06/26/2022

## 2022-06-26 NOTE — Progress Notes (Signed)
RN called that patient is getting ready for transfer with [facility] arriving in less than 5 minutes. Dr Dellia Cloud has examined the patient and completed EMTALA form, discharge order placed.

## 2022-06-26 NOTE — Progress Notes (Signed)
  Echocardiogram 2D Echocardiogram has been performed.  Thomas Wood 06/26/2022, 3:05 PM

## 2022-06-26 NOTE — Telephone Encounter (Signed)
Error  (Note on pt advice request/mychart msg)

## 2022-06-26 NOTE — Progress Notes (Signed)
Heart Failure Navigator Progress Note  Assessed for Heart & Vascular TOC clinic readiness.  Patient does not meet criteria due to Advanced Heart Failure Patient of Dr. Haroldine Laws.     Earnestine Leys, BSN, Clinical cytogeneticist Only

## 2022-06-27 ENCOUNTER — Telehealth: Payer: Self-pay

## 2022-06-27 NOTE — Telephone Encounter (Signed)
Transition Care Management Follow-up Telephone Call Date of discharge and from where: 06/26/22 [facility]. Transferred to [facility]. Dx: Ventricular fibrillation. How have you been since you were released from the hospital? I'm ok. Any questions or concerns? No  Items Reviewed: Did the pt receive and understand the discharge instructions provided? No  Medications obtained and verified?  N/a Other? No  Any new allergies since your discharge? No  Dietary orders reviewed? No Do you have support at home? Yes   Home Care and Equipment/Supplies: Were home health services ordered? not applicable If so, what is the name of the agency? N/a  Has the agency set up a time to come to the patient's home? not applicable Were any new equipment or medical supplies ordered?  No What is the name of the medical supply agency? N/a Were you able to get the supplies/equipment? not applicable Do you have any questions related to the use of the equipment or supplies? No  Functional Questionnaire: (I = Independent and D = Dependent) ADLs: I  Bathing/Dressing- I  Meal Prep- I  Eating- I  Maintaining continence- I  Transferring/Ambulation- I  Managing Meds- I  Follow up appointments reviewed:  PCP Hospital f/u appt confirmed? No  Scheduled to see n/a on n/a @ n/a. [facility] f/u appt confirmed? No  Scheduled to see n/a on n/a @ n/a. Are transportation arrangements needed? No  If their condition worsens, is the pt aware to call PCP or go to the Emergency Dept.? Yes Was the patient provided with contact information for the PCP's office or ED? Yes Was to pt encouraged to call back with questions or concerns? Yes  Angeline Slim, RN, [facility] representative

## 2022-07-01 ENCOUNTER — Ambulatory Visit: Payer: 59

## 2022-07-03 ENCOUNTER — Other Ambulatory Visit: Payer: Self-pay | Admitting: Internal Medicine

## 2022-07-03 DIAGNOSIS — Z8673 Personal history of transient ischemic attack (TIA), and cerebral infarction without residual deficits: Secondary | ICD-10-CM

## 2022-07-18 HISTORY — PX: OTHER SURGICAL HISTORY: SHX169

## 2022-07-20 DIAGNOSIS — Z941 Heart transplant status: Secondary | ICD-10-CM | POA: Insufficient documentation

## 2022-07-20 DIAGNOSIS — Z9225 Personal history of immunosupression therapy: Secondary | ICD-10-CM | POA: Insufficient documentation

## 2022-07-21 ENCOUNTER — Telehealth (HOSPITAL_COMMUNITY): Payer: Self-pay | Admitting: Cardiology

## 2022-07-21 NOTE — Telephone Encounter (Signed)
Per Zoll rep Zoll order ended while patient is hospitalized for possible transplant  Will update chart as FYI

## 2022-07-31 DIAGNOSIS — I10 Essential (primary) hypertension: Secondary | ICD-10-CM | POA: Insufficient documentation

## 2022-08-12 ENCOUNTER — Ambulatory Visit: Payer: 59 | Attending: Genetic Counselor | Admitting: Genetic Counselor

## 2022-08-13 NOTE — Progress Notes (Signed)
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 He is healing well and is scheduled for cardiac rehab 45 days after his surgery. He is happy with his recovery.  I review his family tree and he reports no changes in his relatives since our last visit. 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 The variants are in the desmosomal genes, PKP2 (c.2155_2156delinsCT, p.Ser179leu) and DSP (c.677A>G, p.Asn226Ser).   I explained to him that both variants have not yet been reported in patients with ARVC and have molecular characteristics of a pathogenic variant. However, further evidence- either variant segregation with disease and/or functional assays are required to classify this as a pathogenic or benign variant. Hence both variants are now called as variants of unknown significance.   Since his father was found to have heart failure at a very young age and underwent heart transplant at 72, it would be help reclassify the variants if he underwent genetic testing for these two familial variants. If he is found to harbor one or both the variants, then these can be reclassified as likely pathogenic variants. Thomas Wood informs me that his father underwent genetic testing at [facility] when he visited them in August. However, upon reviewing in [place], I do not see a copy of his genetic test report. Nevertheless, I asked Thomas Wood to let me know if his father had genetic testing. If not, then we can test him for these two variants. He understands this.   Also explained to him that since the two variants are currently classified as a VUS, his siblings should not be tested until we have his father's test result. He expressed understanding of this. Answered all other questions he had  regarding this test.  Sidney Ace, Ph.D, [facility] Clinical Molecular Geneticist

## 2022-08-18 ENCOUNTER — Telehealth (HOSPITAL_COMMUNITY): Payer: Self-pay

## 2022-08-18 NOTE — Telephone Encounter (Signed)
Outside/paper referral received by Meriam Sprague from [facility]. Will fax over Physician order and request further documents. Insurance benefits and eligibility to be determined.

## 2022-08-18 NOTE — Telephone Encounter (Signed)
Called and spoke with pt in regards to CR, pt stated he is interested in CR. Explained scheduling process and went over insurance, patient verbalized understanding.

## 2022-09-17 ENCOUNTER — Telehealth (HOSPITAL_COMMUNITY): Payer: Self-pay

## 2022-09-17 NOTE — Telephone Encounter (Signed)
Pt insurance is active and benefits verified through [employer] $40, DED $600/0 met, out of pocket $3,000/0 met, co-insurance 0%. no pre-authorization required. Passport, 09/17/2022@4 :00pm, REF# [id number]   How many CR sessions are covered? (72 sessions for ICR)72 Is this a lifetime maximum or an annual maximum? annual Has the member used any of these services to date? no Is there a time limit (weeks/months) on start of program and/or program completion? no

## 2022-09-17 NOTE — Telephone Encounter (Signed)
Called patient to see if he was interested in participating in the Cardiac Rehab Program. Patient stated yes. Patient will come in for orientation on 09/25/22@10 :30am and will attend the 12:30pm exercise class.   Sent information to [employer].

## 2022-09-24 ENCOUNTER — Telehealth (HOSPITAL_COMMUNITY): Payer: Self-pay

## 2022-09-24 NOTE — Telephone Encounter (Signed)
Called and confirmed Cardiac Rehab appointment. Also completed the CR Nursing Profile Assessment with the patient. Directions, instructions provided to the patient about the CR orientation process. Pt understands without assistance. 

## 2022-09-25 ENCOUNTER — Encounter (HOSPITAL_COMMUNITY)
Admission: RE | Admit: 2022-09-25 | Discharge: 2022-09-25 | Disposition: A | Discharge status: Home / Self Care | Payer: 59 | Source: Ambulatory Visit | Attending: Internal Medicine | Admitting: Internal Medicine

## 2022-09-25 ENCOUNTER — Encounter (HOSPITAL_COMMUNITY): Payer: Self-pay

## 2022-09-25 VITALS — BP 104/82 | HR 88 | Ht 70.5 in | Wt 232.4 lb

## 2022-09-25 DIAGNOSIS — Z48812 Encounter for surgical aftercare following surgery on the circulatory system: Secondary | ICD-10-CM | POA: Diagnosis not present

## 2022-09-25 DIAGNOSIS — Z941 Heart transplant status: Secondary | ICD-10-CM | POA: Diagnosis present

## 2022-09-25 HISTORY — DX: Hyperlipidemia, unspecified: E78.5

## 2022-09-25 HISTORY — DX: Essential (primary) hypertension: I10

## 2022-09-25 NOTE — Progress Notes (Signed)
Cardiac Rehab Medication Review by a Nurse  Does the patient  feel that his/her medications are working for him/her?  yes  Has the patient been experiencing any side effects to the medications prescribed?  no  Does the patient measure his/her own blood pressure or blood glucose at home?  yes   Does the patient have any problems obtaining medications due to transportation or finances?   no  Understanding of regimen: excellent Understanding of indications: excellent Potential of compliance: excellent    Nurse comments: Thomas Wood is taking his medications as prescribed and has a good understanding of what his medications are for. Thomas Wood checks his blood pressures at home.    Christa See [name] RN 09/25/2022 1:37 PM

## 2022-09-25 NOTE — Progress Notes (Signed)
Cardiac Individual Treatment Plan  Patient Details  Name: Thomas Wood MRN: [id number] Date of Birth: 08/30/1996 Referring Provider:   Flowsheet Row INTENSIVE CARDIAC REHAB ORIENT from 09/25/2022 in [facility]  Referring Provider Dr Cheral Bay MD/ Dr Armanda Magic MD, Coveriing       Initial Encounter Date:  Flowsheet Row INTENSIVE CARDIAC REHAB ORIENT from 09/25/2022 in [facility]  Date 09/25/22       Visit Diagnosis: 07/18/22 S/P orthotopic heart transplant at [facility]  Patient's Home Medications on Admission:  Current Outpatient Medications:    acetaminophen (TYLENOL) 325 MG tablet, Take 325 mg by mouth every 6 (six) hours as needed for moderate pain., Disp: , Rfl:    amLODipine (NORVASC) 5 MG tablet, Take 5 mg by mouth 2 (two) times daily., Disp: , Rfl:    aspirin EC 81 MG tablet, Take 81 mg by mouth once., Disp: , Rfl:    Calcium Carb-Cholecalciferol 600-10 MG-MCG TABS, Take 600 mg by mouth in the morning and at bedtime. With food, Disp: , Rfl:    magnesium oxide (MAG-OX) 400 MG tablet, Take 400 mg by mouth 2 (two) times daily. With lunch and dinner, Disp: , Rfl:    mycophenolate (CELLCEPT) 250 MG capsule, Take 500 mg by mouth every 12 (twelve) hours., Disp: , Rfl:    pantoprazole (PROTONIX) 40 MG tablet, Take 40 mg by mouth daily., Disp: , Rfl:    predniSONE (DELTASONE) 5 MG tablet, Take 2 tablets by mouth daily., Disp: , Rfl:    sertraline (ZOLOFT) 50 MG tablet, Take 50 mg by mouth daily., Disp: , Rfl:    sulfamethoxazole-trimethoprim (BACTRIM) 400-80 MG tablet, Take 1 tablet by mouth daily., Disp: , Rfl:    tacrolimus (PROGRAF) 1 MG capsule, Take 4 capsules by mouth 2 (two) times daily. 4 in the morning, 3 at night, Disp: , Rfl:    albuterol (VENTOLIN HFA) 108 (90 Base) MCG/ACT inhaler, Inhale 2 puffs into the lungs every 6 (six) hours as needed  for wheezing or shortness of breath., Disp: , Rfl:    cetirizine (ZYRTEC) 10 MG tablet, Take 10 mg by mouth daily as needed (for seasonal allergies)., Disp: , Rfl:    nystatin (MYCOSTATIN) 100000 UNIT/ML suspension, Take 5 mLs by mouth 4 (four) times daily., Disp: , Rfl:    valACYclovir (VALTREX) 500 MG tablet, Take 500 mg by mouth 2 (two) times daily., Disp: , Rfl:   Past Medical History: 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   Chronic kidney disease    stones   GERD (gastroesophageal reflux disease)    Hyperlipidemia    Hypertension    New onset of congestive heart failure (HCC) 07/11/2021    Tobacco Use: Social History   Tobacco Use  Smoking Status Former   Types: Cigarettes   Quit date: 2018   Years since quitting: 6.0  Smokeless Tobacco Never    Labs: Review Flowsheet  More data exists      Latest Ref Rng & Units 07/13/2021 07/14/2021 07/15/2021 07/16/2021 07/24/2021  Labs for ITP Cardiac and Pulmonary Rehab  Cholestrol 0 - 200 mg/dL - - - - 409   LDL (calc) 0 - 99 mg/dL - - - - 811   HDL-C >91 mg/dL - - - - 35  Trlycerides <150 mg/dL - - - - 50   Hemoglobin A1c 4.8 - 5.6 % - 5.3  - - 4.9   TCO2 22 - 32 mmol/L - - - - 21   O2 Saturation % 56.7  66.3  64.2  58.4  -    Capillary Blood Glucose: Lab Results  Component Value Date   GLUCAP 71 07/25/2021     Exercise Target Goals: Exercise Program Goal: Individual exercise prescription set using results from initial 6 min walk test and THRR while considering  patient's activity barriers and safety.   Exercise Prescription Goal: Initial exercise prescription builds to 30-45 minutes a day of aerobic activity, 2-3 days per week.  Home exercise guidelines will be given to patient during program as part of exercise prescription that the participant will acknowledge.  Activity Barriers & Risk Stratification:  Activity  Barriers & Cardiac Risk Stratification - 09/25/22 1220       Activity Barriers & Cardiac Risk Stratification   Activity Barriers None    Cardiac Risk Stratification High   < 5MES on 6MWT            6 Minute Walk:  6 Minute Walk     Row Name 09/25/22 1219         6 Minute Walk   Phase Initial     Distance 1145 feet     Walk Time 6 minutes     # of Rest Breaks 0     MPH 2.17     METS 3.79     RPE 11     Perceived Dyspnea  0     VO2 Peak 13.27     Symptoms No     Resting HR 88 bpm     Resting BP 104/82     Resting Oxygen Saturation  98 %     Exercise Oxygen Saturation  during 6 min walk 98 %     Max Ex. HR 105 bpm     Max Ex. BP 111/71     2 Minute Post BP 117/81              Oxygen Initial Assessment:   Oxygen Re-Evaluation:   Oxygen Discharge (Final Oxygen Re-Evaluation):   Initial Exercise Prescription:  Initial Exercise Prescription - 09/25/22 1200       Date of Initial Exercise RX and Referring Provider   Date 09/25/22    Referring Provider Dr Melvyn Novas MD/ Dr Fransico Him MD, Coveriing    Expected Discharge Date 11/24/22      Bike   Level 2    Watts 70    Minutes 15    METs 3      Recumbant Elliptical   Level 2    RPM 65    Minutes 15    METs 3      Prescription Details   Frequency (times per week) 3    Duration Progress to 30 minutes of continuous aerobic without signs/symptoms of physical distress      Intensity   THRR 40-80% of Max Heartrate 78-156    Ratings of Perceived Exertion 11-13    Perceived Dyspnea 0-4      Progression   Progression Continue to progress workloads to maintain intensity without signs/symptoms of physical distress.      Resistance Training   Training Prescription Yes    Weight 4    Reps 10-15             Perform Capillary Blood  Glucose checks as needed.  Exercise Prescription Changes:   Exercise Comments:   Exercise Goals and Review:   Exercise Goals     Row Name 09/25/22 1322              Exercise Goals   Increase Physical Activity Yes       Intervention Provide advice, education, support and counseling about physical activity/exercise needs.;Develop an individualized exercise prescription for aerobic and resistive training based on initial evaluation findings, risk stratification, comorbidities and participant's personal goals.       Expected Outcomes Long Term: Exercising regularly at least 3-5 days a week.;Short Term: Attend rehab on a regular basis to increase amount of physical activity.;Long Term: Add in home exercise to make exercise part of routine and to increase amount of physical activity.       Increase Strength and Stamina Yes       Intervention Provide advice, education, support and counseling about physical activity/exercise needs.;Develop an individualized exercise prescription for aerobic and resistive training based on initial evaluation findings, risk stratification, comorbidities and participant's personal goals.       Expected Outcomes Short Term: Increase workloads from initial exercise prescription for resistance, speed, and METs.;Short Term: Perform resistance training exercises routinely during rehab and add in resistance training at home;Long Term: Improve cardiorespiratory fitness, muscular endurance and strength as measured by increased METs and functional capacity (6MWT)       Able to understand and use rate of perceived exertion (RPE) scale Yes       Intervention Provide education and explanation on how to use RPE scale       Expected Outcomes Short Term: Able to use RPE daily in rehab to express subjective intensity level;Long Term:  Able to use RPE to guide intensity level when exercising independently       Knowledge and understanding of Target Heart Rate Range (THRR) Yes       Intervention Provide education and explanation of THRR including how the numbers were predicted and where they are located for reference       Expected Outcomes  Short Term: Able to state/look up THRR;Long Term: Able to use THRR to govern intensity when exercising independently;Short Term: Able to use daily as guideline for intensity in rehab       Understanding of Exercise Prescription Yes       Intervention Provide education, explanation, and written materials on patient's individual exercise prescription       Expected Outcomes Short Term: Able to explain program exercise prescription;Long Term: Able to explain home exercise prescription to exercise independently                Exercise Goals Re-Evaluation :   Discharge Exercise Prescription (Final Exercise Prescription Changes):   Nutrition:  Target Goals: Understanding of nutrition guidelines, daily intake of sodium 1500mg , cholesterol 200mg , calories 30% from fat and 7% or less from saturated fats, daily to have 5 or more servings of fruits and vegetables.  Biometrics:  Pre Biometrics - 09/25/22 1216       Pre Biometrics   Waist Circumference 44.5 inches    Hip Circumference 47.25 inches    Waist to Hip Ratio 0.94 %    Triceps Skinfold 24 mm    % Body Fat 31.8 %    Grip Strength 46 kg    Flexibility 15.75 in    Single Leg Stand 30 seconds              Nutrition  Therapy Plan and Nutrition Goals:   Nutrition Assessments:  Nutrition Assessments - 09/25/22 1215       Rate Your Plate Scores   Pre Score 57            MEDIFICTS Score Key: ?70 Need to make dietary changes  40-70 Heart Healthy Diet ? 40 Therapeutic Level Cholesterol Diet   Flowsheet Row INTENSIVE CARDIAC REHAB ORIENT [facility]  Picture Your Plate Total Score on Admission 57      Picture Your Plate Scores: <16<40 Unhealthy dietary pattern with much room for improvement. 41-50 Dietary pattern unlikely to meet recommendations for good health and room for improvement. 51-60 More healthful dietary pattern, with some room  for improvement.  >60 Healthy dietary pattern, although there may be some specific behaviors that could be improved.    Nutrition Goals Re-Evaluation:   Nutrition Goals Re-Evaluation:   Nutrition Goals Discharge (Final Nutrition Goals Re-Evaluation):   Psychosocial: Target Goals: Acknowledge presence or absence of significant depression and/or stress, maximize coping skills, provide positive support system. Participant is able to verbalize types and ability to use techniques and skills needed for reducing stress and depression.  Initial Review & Psychosocial Screening:  Initial Psych Review & Screening - 09/25/22 1323       Initial Review   Current issues with None Identified      Family Dynamics   Good Support System? Yes   Weston Brassick has his wife, and Mom for support     Barriers   Psychosocial barriers to participate in program There are no identifiable barriers or psychosocial needs.;The patient should benefit from training in stress management and relaxation.      Screening Interventions   Interventions Encouraged to exercise             Quality of Life Scores:  Quality of Life - 09/25/22 1224       Quality of Life   Select Quality of Life      Quality of Life Scores   Health/Function Pre 26.8 %    Socioeconomic Pre 20.25 %    Psych/Spiritual Pre 24.86 %    Family Pre 26.4 %    GLOBAL Pre 24.86 %            Scores of 19 and below usually indicate a poorer quality of life in these areas.  A difference of  2-3 points is a clinically meaningful difference.  A difference of 2-3 points in the total score of the Quality of Life Index has been associated with significant improvement in overall quality of life, self-image, physical symptoms, and general health in studies assessing change in quality of life.  PHQ-9: Review Flowsheet       09/25/2022 08/12/2021 09/18/2017 08/11/2016  Depression screen PHQ 2/9  Decreased Interest 0 0 0 0  Down, Depressed, Hopeless  0 0 0 0  PHQ - 2 Score 0 0 0 0  Altered sleeping 0 - - -  Tired, decreased energy 0 - - -  Change in appetite 0 - - -  Feeling bad or failure about yourself  0 - - -  Trouble concentrating 0 - - -  Moving slowly or fidgety/restless 0 - - -  Suicidal thoughts 0 - - -  PHQ-9 Score 0 - - -   Interpretation of Total Score  Total Score Depression Severity:  1-4 = Minimal depression, 5-9 = Mild depression, 10-14 = Moderate  depression, 15-19 = Moderately severe depression, 20-27 = Severe depression   Psychosocial Evaluation and Intervention:   Psychosocial Re-Evaluation:   Psychosocial Discharge (Final Psychosocial Re-Evaluation):   Vocational Rehabilitation: Provide vocational rehab assistance to qualifying candidates.   Vocational Rehab Evaluation & Intervention:  Vocational Rehab - 09/25/22 1324       Initial Vocational Rehab Evaluation & Intervention   Assessment shows need for Vocational Rehabilitation No   [name] is currently not working due to his recent heart transplant surgery            Education: Education Goals: Education classes will be provided on a weekly basis, covering required topics. Participant will state understanding/return demonstration of topics presented.     Core Videos: Exercise    Move It!  Clinical staff conducted group or individual video education with verbal and written material and guidebook.  Patient learns the recommended Pritikin exercise program. Exercise with the goal of living a long, healthy life. Some of the health benefits of exercise include controlled diabetes, healthier blood pressure levels, improved cholesterol levels, improved heart and lung capacity, improved sleep, and better body composition. Everyone should speak with their doctor before starting or changing an exercise routine.  Biomechanical Limitations Clinical staff conducted group or individual video education with verbal and written material and guidebook.  Patient  learns how biomechanical limitations can impact exercise and how we can mitigate and possibly overcome limitations to have an impactful and balanced exercise routine.  Body Composition Clinical staff conducted group or individual video education with verbal and written material and guidebook.  Patient learns that body composition (ratio of muscle mass to fat mass) is a key component to assessing overall fitness, rather than body weight alone. Increased fat mass, especially visceral belly fat, can put [place] at increased risk for metabolic syndrome, type 2 diabetes, heart disease, and even death. It is recommended to combine diet and exercise (cardiovascular and resistance training) to improve your body composition. Seek guidance from your physician and exercise physiologist before implementing an exercise routine.  Exercise Action Plan Clinical staff conducted group or individual video education with verbal and written material and guidebook.  Patient learns the recommended strategies to achieve and enjoy long-term exercise adherence, including variety, self-motivation, self-efficacy, and positive decision making. Benefits of exercise include fitness, good health, weight management, more energy, better sleep, less stress, and overall well-being.  Medical   Heart Disease Risk Reduction Clinical staff conducted group or individual video education with verbal and written material and guidebook.  Patient learns our heart is our most vital organ as it circulates oxygen, nutrients, white blood cells, and hormones throughout the entire body, and carries waste away. Data supports a plant-based eating plan like the Pritikin Program for its effectiveness in slowing progression of and reversing heart disease. The video provides a number of recommendations to address heart disease.   Metabolic Syndrome and Belly Fat  Clinical staff conducted group or individual video education with verbal and written material and  guidebook.  Patient learns what metabolic syndrome is, how it leads to heart disease, and how one can reverse it and keep it from coming back. You have metabolic syndrome if you have 3 of the following 5 criteria: abdominal obesity, high blood pressure, high triglycerides, low HDL cholesterol, and high blood sugar.  Hypertension and Heart Disease Clinical staff conducted group or individual video education with verbal and written material and guidebook.  Patient learns that high blood pressure, or hypertension, is very common in  the [place]. Hypertension is largely due to excessive salt intake, but other important risk factors include being overweight, physical inactivity, drinking too much alcohol, smoking, and not eating enough potassium from fruits and vegetables. High blood pressure is a leading risk factor for heart attack, stroke, congestive heart failure, dementia, kidney failure, and premature death. Long-term effects of excessive salt intake include stiffening of the arteries and thickening of heart muscle and organ damage. Recommendations include ways to reduce hypertension and the risk of heart disease.  Diseases of Our Time - Focusing on Diabetes Clinical staff conducted group or individual video education with verbal and written material and guidebook.  Patient learns why the best way to stop diseases of our time is prevention, through food and other lifestyle changes. Medicine (such as prescription pills and surgeries) is often only a Band-Aid on the problem, not a long-term solution. Most common diseases of our time include obesity, type 2 diabetes, hypertension, heart disease, and cancer. The Pritikin Program is recommended and has been proven to help reduce, reverse, and/or prevent the damaging effects of metabolic syndrome.  Nutrition   Overview of the Pritikin Eating Plan  Clinical staff conducted group or individual video education with verbal and written material and  guidebook.  Patient learns about the Pritikin Eating Plan for disease risk reduction. The Pritikin Eating Plan emphasizes a wide variety of unrefined, minimally-processed carbohydrates, like fruits, vegetables, whole grains, and legumes. Go, Caution, and Stop food choices are explained. Plant-based and lean animal proteins are emphasized. Rationale provided for low sodium intake for blood pressure control, low added sugars for blood sugar stabilization, and low added fats and oils for coronary artery disease risk reduction and weight management.  Calorie Density  Clinical staff conducted group or individual video education with verbal and written material and guidebook.  Patient learns about calorie density and how it impacts the Pritikin Eating Plan. Knowing the characteristics of the food you choose will help you decide whether those foods will lead to weight gain or weight loss, and whether you want to consume more or less of them. Weight loss is usually a side effect of the Pritikin Eating Plan because of its focus on low calorie-dense foods.  Label Reading  Clinical staff conducted group or individual video education with verbal and written material and guidebook.  Patient learns about the Pritikin recommended label reading guidelines and corresponding recommendations regarding calorie density, added sugars, sodium content, and whole grains.  Dining Out - Part 1  Clinical staff conducted group or individual video education with verbal and written material and guidebook.  Patient learns that restaurant meals can be sabotaging because they can be so high in calories, fat, sodium, and/or sugar. Patient learns recommended strategies on how to positively address this and avoid unhealthy pitfalls.  Facts on Fats  Clinical staff conducted group or individual video education with verbal and written material and guidebook.  Patient learns that lifestyle modifications can be just as effective, if not  more so, as many medications for lowering your risk of heart disease. A Pritikin lifestyle can help to reduce your risk of inflammation and atherosclerosis (cholesterol build-up, or plaque, in the artery walls). Lifestyle interventions such as dietary choices and physical activity address the cause of atherosclerosis. A review of the types of fats and their impact on blood cholesterol levels, along with dietary recommendations to reduce fat intake is also included.  Nutrition Action Plan  Clinical staff conducted group or individual video education with verbal  and written material and guidebook.  Patient learns how to incorporate Pritikin recommendations into their lifestyle. Recommendations include planning and keeping personal health goals in mind as an important part of their success.  Healthy Mind-Set    Healthy Minds, Bodies, Hearts  Clinical staff conducted group or individual video education with verbal and written material and guidebook.  Patient learns how to identify when they are stressed. Video will discuss the impact of that stress, as well as the many benefits of stress management. Patient will also be introduced to stress management techniques. The way we think, act, and feel has an impact on our hearts.  How Our Thoughts Can Heal Our Hearts  Clinical staff conducted group or individual video education with verbal and written material and guidebook.  Patient learns that negative thoughts can cause depression and anxiety. This can result in negative lifestyle behavior and serious health problems. Cognitive behavioral therapy is an effective method to help control our thoughts in order to change and improve our emotional outlook.  Additional Videos:  Exercise    Improving Performance  Clinical staff conducted group or individual video education with verbal and written material and guidebook.  Patient learns to use a non-linear approach by alternating intensity levels and lengths of  time spent exercising to help burn more calories and lose more body fat. Cardiovascular exercise helps improve heart health, metabolism, hormonal balance, blood sugar control, and recovery from fatigue. Resistance training improves strength, endurance, balance, coordination, reaction time, metabolism, and muscle mass. Flexibility exercise improves circulation, posture, and balance. Seek guidance from your physician and exercise physiologist before implementing an exercise routine and learn your capabilities and proper form for all exercise.  Introduction to Yoga  Clinical staff conducted group or individual video education with verbal and written material and guidebook.  Patient learns about yoga, a discipline of the coming together of mind, breath, and body. The benefits of yoga include improved flexibility, improved range of motion, better posture and core strength, increased lung function, weight loss, and positive self-image. Yoga's heart health benefits include lowered blood pressure, healthier heart rate, decreased cholesterol and triglyceride levels, improved immune function, and reduced stress. Seek guidance from your physician and exercise physiologist before implementing an exercise routine and learn your capabilities and proper form for all exercise.  Medical   Aging: Enhancing Your Quality of Life  Clinical staff conducted group or individual video education with verbal and written material and guidebook.  Patient learns key strategies and recommendations to stay in good physical health and enhance quality of life, such as prevention strategies, having an advocate, securing a Health Care Proxy and Power of Attorney, and keeping a list of medications and system for tracking them. It also discusses how to avoid risk for bone loss.  Biology of Weight Control  Clinical staff conducted group or individual video education with verbal and written material and guidebook.  Patient learns that weight  gain occurs because we consume more calories than we burn (eating more, moving less). Even if your body weight is normal, you may have higher ratios of fat compared to muscle mass. Too much body fat puts you at increased risk for cardiovascular disease, heart attack, stroke, type 2 diabetes, and obesity-related cancers. In addition to exercise, following the Pritikin Eating Plan can help reduce your risk.  Decoding Lab Results  Clinical staff conducted group or individual video education with verbal and written material and guidebook.  Patient learns that lab test reflects one measurement whose values  change over time and are influenced by many factors, including medication, stress, sleep, exercise, food, hydration, pre-existing medical conditions, and more. It is recommended to use the knowledge from this video to become more involved with your lab results and evaluate your numbers to speak with your doctor.   Diseases of Our Time - Overview  Clinical staff conducted group or individual video education with verbal and written material and guidebook.  Patient learns that according to the CDC, 50% to 70% of chronic diseases (such as obesity, type 2 diabetes, elevated lipids, hypertension, and heart disease) are avoidable through lifestyle improvements including healthier food choices, listening to satiety cues, and increased physical activity.  Sleep Disorders Clinical staff conducted group or individual video education with verbal and written material and guidebook.  Patient learns how good quality and duration of sleep are important to overall health and well-being. Patient also learns about sleep disorders and how they impact health along with recommendations to address them, including discussing with a physician.  Nutrition  Dining Out - Part 2 Clinical staff conducted group or individual video education with verbal and written material and guidebook.  Patient learns how to plan ahead and  communicate in order to maximize their dining experience in a healthy and nutritious manner. Included are recommended food choices based on the type of restaurant the patient is visiting.   Fueling a BankerHealthy Body  Clinical staff conducted group or individual video education with verbal and written material and guidebook.  There is a strong connection between our food choices and our health. Diseases like obesity and type 2 diabetes are very prevalent and are in large-part due to lifestyle choices. The Pritikin Eating Plan provides plenty of food and hunger-curbing satisfaction. It is easy to follow, affordable, and helps reduce health risks.  Menu Workshop  Clinical staff conducted group or individual video education with verbal and written material and guidebook.  Patient learns that restaurant meals can sabotage health goals because they are often packed with calories, fat, sodium, and sugar. Recommendations include strategies to plan ahead and to communicate with the manager, chef, or server to help order a healthier meal.  Planning Your Eating Strategy  Clinical staff conducted group or individual video education with verbal and written material and guidebook.  Patient learns about the Pritikin Eating Plan and its benefit of reducing the risk of disease. The Pritikin Eating Plan does not focus on calories. Instead, it emphasizes high-quality, nutrient-rich foods. By knowing the characteristics of the foods, we choose, we can determine their calorie density and make informed decisions.  Targeting Your Nutrition Priorities  Clinical staff conducted group or individual video education with verbal and written material and guidebook.  Patient learns that lifestyle habits have a tremendous impact on disease risk and progression. This video provides eating and physical activity recommendations based on your personal health goals, such as reducing LDL cholesterol, losing weight, preventing or controlling  type 2 diabetes, and reducing high blood pressure.  Vitamins and Minerals  Clinical staff conducted group or individual video education with verbal and written material and guidebook.  Patient learns different ways to obtain key vitamins and minerals, including through a recommended healthy diet. It is important to discuss all supplements you take with your doctor.   Healthy Mind-Set    Smoking Cessation  Clinical staff conducted group or individual video education with verbal and written material and guidebook.  Patient learns that cigarette smoking and tobacco addiction pose a serious health risk which affects  millions of people. Stopping smoking will significantly reduce the risk of heart disease, lung disease, and many forms of cancer. Recommended strategies for quitting are covered, including working with your doctor to develop a successful plan.  Culinary   Becoming a Set designer conducted group or individual video education with verbal and written material and guidebook.  Patient learns that cooking at home can be healthy, cost-effective, quick, and puts them in control. Keys to cooking healthy recipes will include looking at your recipe, assessing your equipment needs, planning ahead, making it simple, choosing cost-effective seasonal ingredients, and limiting the use of added fats, salts, and sugars.  Cooking - Breakfast and Snacks  Clinical staff conducted group or individual video education with verbal and written material and guidebook.  Patient learns how important breakfast is to satiety and nutrition through the entire day. Recommendations include key foods to eat during breakfast to help stabilize blood sugar levels and to prevent overeating at meals later in the day. Planning ahead is also a key component.  Cooking - Educational psychologist conducted group or individual video education with verbal and written material and guidebook.  Patient learns  eating strategies to improve overall health, including an approach to cook more at home. Recommendations include thinking of animal protein as a side on your plate rather than center stage and focusing instead on lower calorie dense options like vegetables, fruits, whole grains, and plant-based proteins, such as beans. Making sauces in large quantities to freeze for later and leaving the skin on your vegetables are also recommended to maximize your experience.  Cooking - Healthy Salads and Dressing Clinical staff conducted group or individual video education with verbal and written material and guidebook.  Patient learns that vegetables, fruits, whole grains, and legumes are the foundations of the Pritikin Eating Plan. Recommendations include how to incorporate each of these in flavorful and healthy salads, and how to create homemade salad dressings. Proper handling of ingredients is also covered. Cooking - Soups and [employer] - Soups and Desserts Clinical staff conducted group or individual video education with verbal and written material and guidebook.  Patient learns that Pritikin soups and desserts make for easy, nutritious, and delicious snacks and meal components that are low in sodium, fat, sugar, and calorie density, while high in vitamins, minerals, and filling fiber. Recommendations include simple and healthy ideas for soups and desserts.   Overview     The Pritikin Solution Program Overview Clinical staff conducted group or individual video education with verbal and written material and guidebook.  Patient learns that the results of the Pritikin Program have been documented in more than 100 articles published in peer-reviewed journals, and the benefits include reducing risk factors for (and, in some cases, even reversing) high cholesterol, high blood pressure, type 2 diabetes, obesity, and more! An overview of the three key pillars of the Pritikin Program will be covered: eating well,  doing regular exercise, and having a healthy mind-set.  WORKSHOPS  Exercise: Exercise Basics: Building Your Action Plan Clinical staff led group instruction and group discussion with PowerPoint presentation and patient guidebook. To enhance the learning environment the use of posters, models and videos may be added. At the conclusion of this workshop, patients will comprehend the difference between physical activity and exercise, as well as the benefits of incorporating both, into their routine. Patients will understand the FITT (Frequency, Intensity, Time, and Type) principle and how to use it to build an  exercise action plan. In addition, safety concerns and other considerations for exercise and cardiac rehab will be addressed by the presenter. The purpose of this lesson is to promote a comprehensive and effective weekly exercise routine in order to improve patients' overall level of fitness.   Managing Heart Disease: Your Path to a Healthier Heart Clinical staff led group instruction and group discussion with PowerPoint presentation and patient guidebook. To enhance the learning environment the use of posters, models and videos may be added.At the conclusion of this workshop, patients will understand the anatomy and physiology of the heart. Additionally, they will understand how Pritikin's three pillars impact the risk factors, the progression, and the management of heart disease.  The purpose of this lesson is to provide a high-level overview of the heart, heart disease, and how the Pritikin lifestyle positively impacts risk factors.  Exercise Biomechanics Clinical staff led group instruction and group discussion with PowerPoint presentation and patient guidebook. To enhance the learning environment the use of posters, models and videos may be added. Patients will learn how the structural parts of their bodies function and how these functions impact their daily activities, movement, and  exercise. Patients will learn how to promote a neutral spine, learn how to manage pain, and identify ways to improve their physical movement in order to promote healthy living. The purpose of this lesson is to expose patients to common physical limitations that impact physical activity. Participants will learn practical ways to adapt and manage aches and pains, and to minimize their effect on regular exercise. Patients will learn how to maintain good posture while sitting, walking, and lifting.  Balance Training and Fall Prevention  Clinical staff led group instruction and group discussion with PowerPoint presentation and patient guidebook. To enhance the learning environment the use of posters, models and videos may be added. At the conclusion of this workshop, patients will understand the importance of their sensorimotor skills (vision, proprioception, and the vestibular system) in maintaining their ability to balance as they age. Patients will apply a variety of balancing exercises that are appropriate for their current level of function. Patients will understand the common causes for poor balance, possible solutions to these problems, and ways to modify their physical environment in order to minimize their fall risk. The purpose of this lesson is to teach patients about the importance of maintaining balance as they age and ways to minimize their risk of falling.  WORKSHOPS   Nutrition:  Fueling a Ship broker led group instruction and group discussion with PowerPoint presentation and patient guidebook. To enhance the learning environment the use of posters, models and videos may be added. Patients will review the foundational principles of the Pritikin Eating Plan and understand what constitutes a serving size in each of the food groups. Patients will also learn Pritikin-friendly foods that are better choices when away from home and review make-ahead meal and snack options.  Calorie density will be reviewed and applied to three nutrition priorities: weight maintenance, weight loss, and weight gain. The purpose of this lesson is to reinforce (in a group setting) the key concepts around what patients are recommended to eat and how to apply these guidelines when away from home by planning and selecting Pritikin-friendly options. Patients will understand how calorie density may be adjusted for different weight management goals.  Mindful Eating  Clinical staff led group instruction and group discussion with PowerPoint presentation and patient guidebook. To enhance the learning environment the use of posters, models and  videos may be added. Patients will briefly review the concepts of the Pritikin Eating Plan and the importance of low-calorie dense foods. The concept of mindful eating will be introduced as well as the importance of paying attention to internal hunger signals. Triggers for non-hunger eating and techniques for dealing with triggers will be explored. The purpose of this lesson is to provide patients with the opportunity to review the basic principles of the Pritikin Eating Plan, discuss the value of eating mindfully and how to measure internal cues of hunger and fullness using the Hunger Scale. Patients will also discuss reasons for non-hunger eating and learn strategies to use for controlling emotional eating.  Targeting Your Nutrition Priorities Clinical staff led group instruction and group discussion with PowerPoint presentation and patient guidebook. To enhance the learning environment the use of posters, models and videos may be added. Patients will learn how to determine their genetic susceptibility to disease by reviewing their family history. Patients will gain insight into the importance of diet as part of an overall healthy lifestyle in mitigating the impact of genetics and other environmental insults. The purpose of this lesson is to provide patients with the  opportunity to assess their personal nutrition priorities by looking at their family history, their own health history and current risk factors. Patients will also be able to discuss ways of prioritizing and modifying the Pritikin Eating Plan for their highest risk areas  Menu  Clinical staff led group instruction and group discussion with PowerPoint presentation and patient guidebook. To enhance the learning environment the use of posters, models and videos may be added. Using menus brought in from [employer] restaurants, or printed from [employer] menus, patients will apply the Pritikin dining out guidelines that were presented in the [employer] Group[employer]in Dining Out educational video. Patients will also be able to practice these guidelines in a variety of provided scenarios. The purpose of this lesson is to provide patients with the opportunity to practice hands-on learning of the Pritikin Dining Out guidelines with actual menus and practice scenarios.  Label Reading Clinical staff led group instruction and group discussion with PowerPoint presentation and patient guidebook. To enhance the learning environment the use of posters, models and videos may be added. Patients will review and discuss the Pritikin label reading guidelines presented in Pritikin's Label Reading Educational series video. Using fool labels brought in from local grocery stores and markets, patients will apply the label reading guidelines and determine if the packaged food meet the Pritikin guidelines. The purpose of this lesson is to provide patients with the opportunity to review, discuss, and practice hands-on learning of the Pritikin Label Reading guidelines with actual packaged food labels. Cooking School  Pritikin's [employer]ing School Workshops are designed to teach patients ways to prepare quick, simple, and affordable recipes at home. The importance of nutrition's role in chronic disease risk reduction is reflected in its emphasis in the overall  Pritikin program. By learning how to prepare essential core Pritikin Eating Plan recipes, patients will increase control over what they eat; be able to customize the flavor of foods without the use of added salt, sugar, or fat; and improve the quality of the food they consume. By learning a set of core recipes which are easily assembled, quickly prepared, and affordable, patients are more likely to prepare more healthy foods at home. These workshops focus on convenient breakfasts, simple entres, side dishes, and desserts which can be prepared with minimal effort and are consistent with nutrition recommendations for cardiovascular risk reduction.  Cooking [employer] are taught by a Armed forces logistics/support/administrative officer (RD) who has been trained by the [employer]. The chef or RD has a clear understanding of the importance of minimizing - if not completely eliminating - added fat, sugar, and sodium in recipes. Throughout the series of Cooking School Workshop sessions, patients will learn about healthy ingredients and efficient methods of cooking to build confidence in their capability to prepare    Cooking School weekly topics:  Adding Flavor- Sodium-Free  Fast and Healthy Breakfasts  Powerhouse Plant-Based Proteins  Satisfying Salads and Dressings  Simple Sides and Sauces  International Cuisine-Spotlight on the [employer] Zones  Delicious Desserts  Savory Soups  Efficiency Cooking - Meals in a Snap  Tasty Appetizers and Snacks  Comforting Weekend Breakfasts  One-Pot Wonders   Fast Evening Meals  Easy Entertaining  Personalizing Your Pritikin Plate  WORKSHOPS   Healthy Mindset (Psychosocial): New Thoughts, New Behaviors Clinical staff led group instruction and group discussion with PowerPoint presentation and patient guidebook. To enhance the learning environment the use of posters, models and videos may be added. Patients will learn and practice techniques for developing effective health  and lifestyle goals. Patients will be able to effectively apply the goal setting process learned to develop at least one new personal goal.  The purpose of this lesson is to expose patients to a new skill set of behavior modification techniques such as techniques setting SMART goals, overcoming barriers, and achieving new thoughts and new behaviors.  Managing Moods and Relationships Clinical staff led group instruction and group discussion with PowerPoint presentation and patient guidebook. To enhance the learning environment the use of posters, models and videos may be added. Patients will learn how emotional and chronic stress factors can impact their health and relationships. They will learn healthy ways to manage their moods and utilize positive coping mechanisms. In addition, ICR patients will learn ways to improve communication skills. The purpose of this lesson is to expose patients to ways of understanding how one's mood and health are intimately connected. Developing a healthy outlook can help build positive relationships and connections with others. Patients will understand the importance of utilizing effective communication skills that include actively listening and being heard. They will learn and understand the importance of the "4 Cs" and especially Connections in fostering of a Healthy Mind-Set.  Healthy Sleep for a Healthy Heart Clinical staff led group instruction and group discussion with PowerPoint presentation and patient guidebook. To enhance the learning environment the use of posters, models and videos may be added. At the conclusion of this workshop, patients will be able to demonstrate knowledge of the importance of sleep to overall health, well-being, and quality of life. They will understand the symptoms of, and treatments for, common sleep disorders. Patients will also be able to identify daytime and nighttime behaviors which impact sleep, and they will be able to apply these tools  to help manage sleep-related challenges. The purpose of this lesson is to provide patients with a general overview of sleep and outline the importance of quality sleep. Patients will learn about a few of the most common sleep disorders. Patients will also be introduced to the concept of "sleep hygiene," and discover ways to self-manage certain sleeping problems through simple daily behavior changes. Finally, the workshop will motivate patients by clarifying the links between quality sleep and their goals of heart-healthy living.   Recognizing and Reducing Stress Clinical staff led group instruction and group discussion with PowerPoint  presentation and patient guidebook. To enhance the learning environment the use of posters, models and videos may be added. At the conclusion of this workshop, patients will be able to understand the types of stress reactions, differentiate between acute and chronic stress, and recognize the impact that chronic stress has on their health. They will also be able to apply different coping mechanisms, such as reframing negative self-talk. Patients will have the opportunity to practice a variety of stress management techniques, such as deep abdominal breathing, progressive muscle relaxation, and/or guided imagery.  The purpose of this lesson is to educate patients on the role of stress in their lives and to provide healthy techniques for coping with it.  Learning Barriers/Preferences:   Education Topics:  Knowledge Questionnaire Score:  Knowledge Questionnaire Score - 09/25/22 1225       Knowledge Questionnaire Score   Pre Score 20/24             Core Components/Risk Factors/Patient Goals at Admission:  Personal Goals and Risk Factors at Admission - 09/25/22 1225       Core Components/Risk Factors/Patient Goals on Admission    Weight Management Yes;Obesity;Weight Loss    Intervention Weight Management: Develop a combined nutrition and exercise program designed  to reach desired caloric intake, while maintaining appropriate intake of nutrient and fiber, sodium and fats, and appropriate energy expenditure required for the weight goal.;Weight Management: Provide education and appropriate resources to help participant work on and attain dietary goals.;Weight Management/Obesity: Establish reasonable short term and long term weight goals.;Obesity: Provide education and appropriate resources to help participant work on and attain dietary goals.    Admit Weight 232 lb 5.8 oz (105.4 kg)    Goal Weight: Long Term 180 lb (81.6 kg)    Expected Outcomes Long Term: Adherence to nutrition and physical activity/exercise program aimed toward attainment of established weight goal;Short Term: Continue to assess and modify interventions until short term weight is achieved;Weight Loss: Understanding of general recommendations for a balanced deficit meal plan, which promotes 1-2 lb weight loss per week and includes a negative energy balance of [phone] kcal/d;Understanding of distribution of calorie intake throughout the day with the consumption of 4-5 meals/snacks;Understanding recommendations for meals to include 15-35% energy as protein, 25-35% energy from fat, 35-60% energy from carbohydrates, less than 200mg  of dietary cholesterol, 20-35 gm of total fiber daily    Hypertension Yes    Intervention Provide education on lifestyle modifcations including regular physical activity/exercise, weight management, moderate sodium restriction and increased consumption of fresh fruit, vegetables, and low fat dairy, alcohol moderation, and smoking cessation.;Monitor prescription use compliance.    Expected Outcomes Short Term: Continued assessment and intervention until BP is < 140/49mm HG in hypertensive participants. < 130/55mm HG in hypertensive participants with diabetes, heart failure or chronic kidney disease.;Long Term: Maintenance of blood pressure at goal levels.    Lipids Yes     Intervention Provide education and support for participant on nutrition & aerobic/resistive exercise along with prescribed medications to achieve LDL 70mg , HDL >40mg .    Expected Outcomes Short Term: Participant states understanding of desired cholesterol values and is compliant with medications prescribed. Participant is following exercise prescription and nutrition guidelines.;Long Term: Cholesterol controlled with medications as prescribed, with individualized exercise RX and with personalized nutrition plan. Value goals: LDL < 70mg , HDL > 40 mg.             Core Components/Risk Factors/Patient Goals Review:    Core Components/Risk Factors/Patient Goals at Discharge (Final  Review):    ITP Comments:  ITP Comments     Row Name 09/25/22 1215           ITP Comments Dr. Armanda Magic medical director. Introduction to pritikin education program/ intensive cardiac rehab. Initital orientation packet reviewed with patient.                Comments: Participant attended orientation for the cardiac rehabilitation program on  09/25/2022  to perform initial intake and exercise walk test. Patient introduced to the Pritikin Program education and orientation packet was reviewed. Completed 6-minute walk test, measurements, initial ITP, and exercise prescription. Vital signs stable. Telemetry-normal sinus rhythm, asymptomatic.Thayer Headings RN BSN   Service time was from 1031 to 1206.

## 2022-09-29 ENCOUNTER — Encounter (HOSPITAL_COMMUNITY)
Admission: RE | Admit: 2022-09-29 | Discharge: 2022-09-29 | Disposition: A | Discharge status: Home / Self Care | Payer: 59 | Source: Ambulatory Visit | Attending: Cardiology | Admitting: Cardiology

## 2022-09-29 DIAGNOSIS — Z941 Heart transplant status: Secondary | ICD-10-CM

## 2022-09-29 NOTE — Progress Notes (Signed)
Daily Session Note  Patient Details  Name: Thomas Wood MRN: [id number] Date of Birth: [birth year] Referring Provider:   Flowsheet Row INTENSIVE CARDIAC REHAB ORIENT from 09/25/2022 in [facility]  Referring Provider Dr Melvyn Novas MD/ Dr Fransico Him MD, Coveriing       Encounter Date: 09/29/2022  Check In:  Session Check In - 09/29/22 1318       Check-In   Supervising physician immediately available to respond to emergencies [facility] - Physician supervision    Physician(s) Christen Bame, NP    Location [facility]    Staff Present Barnet Pall, RN, Rico Ala, MS, Exercise Physiologist;Johnny Starleen Blue, MS, Exercise Physiologist;Olinty Celesta Aver, MS, ACSM-CEP, Exercise Physiologist;David Makemson, MS, ACSM-CEP, CCRP, Exercise Physiologist    Virtual Visit No    Medication changes reported     No    Fall or balance concerns reported    No    Tobacco Cessation No Change    Warm-up and Cool-down Performed as group-led instruction    Resistance Training Performed Yes    VAD Patient? No    PAD/SET Patient? No      Pain Assessment   Currently in Pain? No/denies    Pain Score 0-No pain    Multiple Pain Sites No             Capillary Blood Glucose: No results found for this or any previous visit (from the past 24 hour(s)).   Exercise Prescription Changes - 09/29/22 1400       Response to Exercise   Blood Pressure (Admit) 118/60    Blood Pressure (Exercise) 134/68    Blood Pressure (Exit) 112/84    Heart Rate (Admit) 92 bpm    Heart Rate (Exercise) 119 bpm    Heart Rate (Exit) 112 bpm    Rating of Perceived Exertion (Exercise) 10    Symptoms None    Comments Pt's first day in the CRP2 program-    Duration Continue with 30 min of aerobic exercise without signs/symptoms of physical distress.    Intensity THRR unchanged      Progression   Progression Continue to progress workloads to  maintain intensity without signs/symptoms of physical distress.    Average METs 3.15      Resistance Training   Training Prescription Yes    Weight 4    Reps 10-15    Time 10 Minutes      Interval Training   Interval Training No      Bike   Level 2    Watts 40    Minutes 15    METs 3.7      Recumbant Elliptical   Level 2    RPM 50    Watts 70    Minutes 15    METs 2.6             Social History   Tobacco Use  Smoking Status Former   Types: Cigarettes   Quit date: 2018   Years since quitting: 6.0  Smokeless Tobacco Never    Goals Met:  Exercise tolerated well No report of concerns or symptoms today Strength training completed today  Goals Unmet:  Not Applicable  Comments: Pt started cardiac rehab today.  Pt tolerated light exercise without difficulty. VSS, telemetry-Sinus rhythm, asymptomatic.  Medication list reconciled. Pt denies barriers to medicaiton compliance.  PSYCHOSOCIAL ASSESSMENT:  PHQ-0. Pt exhibits positive coping skills, hopeful outlook with supportive family. No  psychosocial needs identified at this time, no psychosocial interventions necessary.    Pt enjoys video games, e sports and competitive gaming.   Pt oriented to exercise equipment and routine.    Understanding verbalized.Harrell Gave RN BSN    Dr. Fransico Him is Medical Director for Cardiac Rehab at [facility].

## 2022-10-01 ENCOUNTER — Encounter (HOSPITAL_COMMUNITY)
Admission: RE | Admit: 2022-10-01 | Discharge: 2022-10-01 | Disposition: A | Discharge status: Home / Self Care | Payer: 59 | Source: Ambulatory Visit | Attending: Cardiology | Admitting: Cardiology

## 2022-10-01 DIAGNOSIS — Z941 Heart transplant status: Secondary | ICD-10-CM | POA: Diagnosis not present

## 2022-10-03 ENCOUNTER — Encounter (HOSPITAL_COMMUNITY)
Admission: RE | Admit: 2022-10-03 | Discharge: 2022-10-03 | Disposition: A | Discharge status: Home / Self Care | Payer: 59 | Source: Ambulatory Visit | Attending: Cardiology | Admitting: Cardiology

## 2022-10-03 DIAGNOSIS — Z941 Heart transplant status: Secondary | ICD-10-CM | POA: Diagnosis present

## 2022-10-03 DIAGNOSIS — Z48812 Encounter for surgical aftercare following surgery on the circulatory system: Secondary | ICD-10-CM | POA: Diagnosis not present

## 2022-10-06 ENCOUNTER — Encounter (HOSPITAL_COMMUNITY)
Admission: RE | Admit: 2022-10-06 | Discharge: 2022-10-06 | Disposition: A | Discharge status: Home / Self Care | Payer: 59 | Source: Ambulatory Visit | Attending: Cardiology | Admitting: Cardiology

## 2022-10-06 DIAGNOSIS — Z941 Heart transplant status: Secondary | ICD-10-CM

## 2022-10-08 ENCOUNTER — Ambulatory Visit: Payer: 59 | Admitting: Family Medicine

## 2022-10-08 ENCOUNTER — Encounter (HOSPITAL_COMMUNITY)
Admission: RE | Admit: 2022-10-08 | Discharge: 2022-10-08 | Disposition: A | Discharge status: Home / Self Care | Payer: 59 | Source: Ambulatory Visit | Attending: Cardiology | Admitting: Cardiology

## 2022-10-08 ENCOUNTER — Encounter: Payer: Self-pay | Admitting: Family Medicine

## 2022-10-08 VITALS — BP 120/76 | HR 104 | Temp 98.1°F | Ht 70.5 in | Wt 227.8 lb

## 2022-10-08 DIAGNOSIS — Z9225 Personal history of immunosupression therapy: Secondary | ICD-10-CM | POA: Diagnosis not present

## 2022-10-08 DIAGNOSIS — Z941 Heart transplant status: Secondary | ICD-10-CM

## 2022-10-08 DIAGNOSIS — I10 Essential (primary) hypertension: Secondary | ICD-10-CM | POA: Diagnosis not present

## 2022-10-08 MED ORDER — VALACYCLOVIR HCL 500 MG PO TABS: 500.0000 mg | ORAL_TABLET | Freq: Two times a day (BID) | ORAL | 0 refills | Status: DC

## 2022-10-08 NOTE — Assessment & Plan Note (Signed)
Blood pressure is at goal today. Continue amlodipine 5 mg daily.

## 2022-10-08 NOTE — Assessment & Plan Note (Signed)
Doing well at present. Plans to continue cardiac rehab with eventual goal of returning to work.

## 2022-10-08 NOTE — Progress Notes (Signed)
[facility] [address] [place] [address] Dept: [phone] Dept Fax: [phone]  Chronic Care Office Visit  Subjective:    Patient ID: Thomas Wood, male    DOB: 1996/05/31, 27 y.o..   MRN: [id number]  Chief Complaint  Patient presents with   Medical Management of Chronic Issues    6 month f/u.  No concerns.   Not fasting today.     History of Present Illness:  Patient is in today for reassessment of chronic medical issues.  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 He underwent a heart transplant at [facility] in Nov. He notes he was in the hospital for about a week. he feels tremendously better at present. He is now engaged in cardiac rehabilitation. He is following with the transplant team. He is managed on tacrolimus and mycophenolate to prevent rejection. He has been receiving periodic heart biopsies to monitor for rejection and all has been good so far. It is his hope to recover enough that he can return to work.  [name] has been identified as having hypertension. He is currently managed on amlodipine 5 mg daily. He has had some mild CKD in the past. He is having regular checks on his kidney function.  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   Mild intermittent asthma 09/30/2021   Low HDL (under 40) 09/18/2017   Environmental allergies 08/11/2016   History of kidney stones  08/11/2016   Past Surgical History:  Procedure Laterality Date   07/18/22 S/P Heart Transplant at [place]  07/18/2022   ARTERIAL LINE INSERTION N/A 07/12/2021   Procedure: ARTERIAL LINE INSERTION;  Surgeon: Jolaine Artist, MD;  Location: [place] [facility];  Service: Cardiovascular;  Laterality: N/A;   CARDIAC CATHETERIZATION     CENTRAL LINE INSERTION  07/12/2021   Procedure: CENTRAL LINE INSERTION;  Surgeon: Jolaine Artist, MD;  Location: [place] [facility];  Service: Cardiovascular;;   IR ANGIO INTRA EXTRACRAN SEL COM CAROTID INNOMINATE BILAT MOD SED  07/24/2021   IR ANGIO VERTEBRAL SEL VERTEBRAL BILAT MOD SED  07/24/2021   RADIOLOGY WITH ANESTHESIA N/A 07/24/2021   Procedure: RADIOLOGY WITH ANESTHESIA;  Surgeon: Luanne Bras, MD;  Location: [place];  Service: Radiology;  Laterality: N/A;   RIGHT/LEFT HEART CATH AND CORONARY ANGIOGRAPHY N/A 07/12/2021   Procedure: RIGHT/LEFT HEART CATH AND CORONARY ANGIOGRAPHY;  Surgeon: Jolaine Artist, MD;  Location: [place] [facility];  Service: Cardiovascular;  Laterality: N/A;   Family History  Problem Relation Age of Onset   Diabetes Mother    Asthma Mother    Cancer Mother        breast   Miscarriages / Stillbirths Mother    Kidney disease Mother        stones   Heart disease Father 43       MI, heart transplant   Diabetes Father    Heart failure Father        LVAD>>Heart Transplant (  28s)   Asthma Brother    Diabetes Maternal Grandmother    Cancer Maternal Grandmother        Breast   Kidney disease Maternal Grandmother    Stroke Maternal Grandmother    Diabetes Paternal Grandmother    Cancer Paternal Grandfather    Outpatient Medications Prior to Visit  Medication Sig Dispense Refill   acetaminophen (TYLENOL) 325 MG tablet Take 325 mg by mouth every 6 (six) hours as needed for moderate pain.     albuterol (VENTOLIN HFA) 108 (90 Base) MCG/ACT inhaler Inhale 2 puffs into the lungs every 6 (six) hours as  needed for wheezing or shortness of breath.     amLODipine (NORVASC) 5 MG tablet Take 5 mg by mouth 2 (two) times daily.     aspirin EC 81 MG tablet Take 81 mg by mouth once.     Calcium Carb-Cholecalciferol 600-10 MG-MCG TABS Take 600 mg by mouth in the morning and at bedtime. With food     cetirizine (ZYRTEC) 10 MG tablet Take 10 mg by mouth daily as needed (for seasonal allergies).     magnesium oxide (MAG-OX) 400 MG tablet Take 400 mg by mouth 2 (two) times daily. With lunch and dinner     mycophenolate (CELLCEPT) 250 MG capsule Take 500 mg by mouth every 12 (twelve) hours.     nystatin (MYCOSTATIN) 100000 UNIT/ML suspension Take 5 mLs by mouth 4 (four) times daily.     pantoprazole (PROTONIX) 40 MG tablet Take 40 mg by mouth daily.     pravastatin (PRAVACHOL) 40 MG tablet Take 1 tablet by mouth at bedtime.     predniSONE (DELTASONE) 5 MG tablet Take 2 tablets by mouth daily.     sertraline (ZOLOFT) 50 MG tablet Take 50 mg by mouth daily.     sulfamethoxazole-trimethoprim (BACTRIM) 400-80 MG tablet Take 1 tablet by mouth daily.     tacrolimus (PROGRAF) 1 MG capsule Take 4 capsules by mouth 2 (two) times daily. 4 in the morning, 3 at night     valACYclovir (VALTREX) 500 MG tablet Take 500 mg by mouth 2 (two) times daily.     No facility-administered medications prior to visit.   Allergies  Allergen Reactions   Amiodarone Other (See Comments)    Hepatotoxicity and hyperthyroidism Abnormal LFTs      Objective:   Today's Vitals   10/08/22 1441  BP: 120/76  Pulse: (!) 104  Temp: 98.1 F (36.7 C)  TempSrc: Temporal  SpO2: 99%  Weight: 227 lb 12.8 oz (103.3 kg)  Height: 5' 10.5" (1.791 m)   Body mass index is 32.22 kg/m.   General: Well developed, well nourished. No acute distress. Lungs: Clear to auscultation bilaterally. No wheezing, rales or rhonchi. CV: RRR with occasional skip beats without murmurs or rubs. Pulses 2+ bilaterally. Extremities:  No edema noted. Psych:  Alert and oriented. Normal mood and affect.  Health Maintenance Due  Topic Date Due   HPV VACCINES (3 - Risk male 3-dose series) 05/27/2012     Assessment & Plan:   Problem List Items Addressed This Visit       Cardiovascular and Mediastinum   S/P orthotopic heart transplant ([place]) - Primary    Doing well at present. Plans to continue cardiac rehab with eventual goal of returning to work.      Relevant Medications   pravastatin (PRAVACHOL) 40 MG tablet   valACYclovir (VALTREX) 500 MG tablet   Essential hypertension    Blood pressure  is at goal today. Continue amlodipine 5 mg daily.      Relevant Medications   pravastatin (PRAVACHOL) 40 MG tablet     Other   Personal history of immunosupression therapy    Currently on anti rejection meds. Also being prescribed valacyclovir for prevention. I will renew this for now.      Relevant Medications   valACYclovir (VALTREX) 500 MG tablet    Return in about 6 months (around 04/08/2023) for Reassessment.   Haydee Salter, MD

## 2022-10-08 NOTE — Assessment & Plan Note (Signed)
Currently on anti rejection meds. Also being prescribed valacyclovir for prevention. I will renew this for now.

## 2022-10-10 ENCOUNTER — Encounter (HOSPITAL_COMMUNITY)
Admission: RE | Admit: 2022-10-10 | Discharge: 2022-10-10 | Disposition: A | Discharge status: Home / Self Care | Payer: 59 | Source: Ambulatory Visit | Attending: Cardiology | Admitting: Cardiology

## 2022-10-10 DIAGNOSIS — Z941 Heart transplant status: Secondary | ICD-10-CM

## 2022-10-13 ENCOUNTER — Encounter (HOSPITAL_COMMUNITY)
Admission: RE | Admit: 2022-10-13 | Discharge: 2022-10-13 | Disposition: A | Discharge status: Home / Self Care | Payer: 59 | Source: Ambulatory Visit | Attending: Cardiology | Admitting: Cardiology

## 2022-10-13 DIAGNOSIS — Z941 Heart transplant status: Secondary | ICD-10-CM | POA: Diagnosis not present

## 2022-10-15 ENCOUNTER — Encounter (HOSPITAL_COMMUNITY)
Admission: RE | Admit: 2022-10-15 | Discharge: 2022-10-15 | Disposition: A | Discharge status: Home / Self Care | Payer: 59 | Source: Ambulatory Visit | Attending: Cardiology | Admitting: Cardiology

## 2022-10-15 DIAGNOSIS — Z941 Heart transplant status: Secondary | ICD-10-CM

## 2022-10-17 ENCOUNTER — Encounter (HOSPITAL_COMMUNITY)
Admission: RE | Admit: 2022-10-17 | Discharge: 2022-10-17 | Disposition: A | Discharge status: Home / Self Care | Payer: 59 | Source: Ambulatory Visit | Attending: Cardiology | Admitting: Cardiology

## 2022-10-17 DIAGNOSIS — Z941 Heart transplant status: Secondary | ICD-10-CM | POA: Diagnosis not present

## 2022-10-20 ENCOUNTER — Encounter (HOSPITAL_COMMUNITY)
Admission: RE | Admit: 2022-10-20 | Discharge: 2022-10-20 | Disposition: A | Discharge status: Home / Self Care | Payer: 59 | Source: Ambulatory Visit | Attending: Cardiology | Admitting: Cardiology

## 2022-10-20 DIAGNOSIS — Z941 Heart transplant status: Secondary | ICD-10-CM

## 2022-10-22 ENCOUNTER — Encounter (HOSPITAL_COMMUNITY)
Admission: RE | Admit: 2022-10-22 | Discharge: 2022-10-22 | Disposition: A | Discharge status: Home / Self Care | Payer: 59 | Source: Ambulatory Visit | Attending: Cardiology | Admitting: Cardiology

## 2022-10-22 DIAGNOSIS — Z941 Heart transplant status: Secondary | ICD-10-CM | POA: Diagnosis not present

## 2022-10-24 ENCOUNTER — Encounter (HOSPITAL_COMMUNITY)
Admission: RE | Admit: 2022-10-24 | Discharge: 2022-10-24 | Disposition: A | Discharge status: Home / Self Care | Payer: 59 | Source: Ambulatory Visit | Attending: Cardiology | Admitting: Cardiology

## 2022-10-24 DIAGNOSIS — Z941 Heart transplant status: Secondary | ICD-10-CM

## 2022-10-27 ENCOUNTER — Encounter (HOSPITAL_COMMUNITY)
Admission: RE | Admit: 2022-10-27 | Discharge: 2022-10-27 | Disposition: A | Discharge status: Home / Self Care | Payer: 59 | Source: Ambulatory Visit | Attending: Cardiology | Admitting: Cardiology

## 2022-10-27 DIAGNOSIS — Z941 Heart transplant status: Secondary | ICD-10-CM

## 2022-10-28 NOTE — Progress Notes (Signed)
Cardiac Individual Treatment Plan  Patient Details  Name: Thomas Wood MRN: [id number] Date of Birth: 10/20/1995 Referring Provider:   Flowsheet Row INTENSIVE CARDIAC REHAB ORIENT from 09/25/2022 in [facility]  Referring Provider Dr Melvyn Novas MD/ Dr Fransico Him MD, [place]       Initial Encounter Date:  [place] from 09/25/2022 in [facility]  Date 09/25/22       Visit Diagnosis: 07/18/22 S/P orthotopic heart transplant at [facility]  Patient's Home Medications on Admission:  Current Outpatient Medications:    acetaminophen (TYLENOL) 325 MG tablet, Take 325 mg by mouth every 6 (six) hours as needed for moderate pain., Disp: , Rfl:    albuterol (VENTOLIN HFA) 108 (90 Base) MCG/ACT inhaler, Inhale 2 puffs into the lungs every 6 (six) hours as needed for wheezing or shortness of breath., Disp: , Rfl:    amLODipine (NORVASC) 5 MG tablet, Take 5 mg by mouth 2 (two) times daily., Disp: , Rfl:    aspirin EC 81 MG tablet, Take 81 mg by mouth once., Disp: , Rfl:    Calcium Carb-Cholecalciferol 600-10 MG-MCG TABS, Take 600 mg by mouth in the morning and at bedtime. With food, Disp: , Rfl:    cetirizine (ZYRTEC) 10 MG tablet, Take 10 mg by mouth daily as needed (for seasonal allergies)., Disp: , Rfl:    magnesium oxide (MAG-OX) 400 MG tablet, Take 400 mg by mouth 2 (two) times daily. With lunch and dinner, Disp: , Rfl:    mycophenolate (CELLCEPT) 250 MG capsule, Take 500 mg by mouth every 12 (twelve) hours., Disp: , Rfl:    nystatin (MYCOSTATIN) 100000 UNIT/ML suspension, Take 5 mLs by mouth 4 (four) times daily., Disp: , Rfl:    pantoprazole (PROTONIX) 40 MG tablet, Take 40 mg by mouth daily., Disp: , Rfl:    pravastatin (PRAVACHOL) 40 MG tablet, Take 1 tablet by mouth at bedtime., Disp: , Rfl:    predniSONE (DELTASONE) 5 MG  tablet, Take 2 tablets by mouth daily., Disp: , Rfl:    sertraline (ZOLOFT) 50 MG tablet, Take 50 mg by mouth daily., Disp: , Rfl:    sulfamethoxazole-trimethoprim (BACTRIM) 400-80 MG tablet, Take 1 tablet by mouth daily., Disp: , Rfl:    tacrolimus (PROGRAF) 1 MG capsule, Take 4 capsules by mouth 2 (two) times daily. 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   Chronic kidney disease    stones   GERD (gastroesophageal reflux disease)    Hyperlipidemia    Hypertension    New onset of congestive heart failure ([place]) 07/11/2021    Tobacco Use: Social History   Tobacco Use  Smoking Status Former   Types: Cigarettes   Quit date: 2018   Years since quitting: 6.1  Smokeless Tobacco Never    Labs: Review Flowsheet  More data exists      Latest Ref Rng & Units 07/13/2021 07/14/2021 07/15/2021 07/16/2021 07/24/2021  Labs for ITP Cardiac and Pulmonary Rehab  Cholestrol 0 - 200 mg/dL - - - - 159   LDL (  calc) 0 - 99 mg/dL - - - - 114   HDL-C >40 mg/dL - - - - 35   Trlycerides <150 mg/dL - - - - 50   Hemoglobin A1c 4.8 - 5.6 % - 5.3  - - 4.9   TCO2 22 - 32 mmol/L - - - - 21   O2 Saturation % 56.7  66.3  64.2  58.4  -    Capillary Blood Glucose: Lab Results  Component Value Date   GLUCAP 71 07/25/2021     Exercise Target Goals: Exercise Program Goal: Individual exercise prescription set using results from initial 6 min walk test and THRR while considering  patient's activity barriers and safety.   Exercise Prescription Goal: Initial exercise prescription builds to 30-45 minutes a day of aerobic activity, 2-3 days per week.  Home exercise guidelines will be given to patient during program as part of  exercise prescription that the participant will acknowledge.  Activity Barriers & Risk Stratification:  Activity Barriers & Cardiac Risk Stratification - 09/25/22 1220       Activity Barriers & Cardiac Risk Stratification   Activity Barriers None    Cardiac Risk Stratification High   < 5MES on 6MWT            6 Minute Walk:  6 Minute Walk     Row Name 09/25/22 1219         6 Minute Walk   Phase Initial     Distance 1145 feet     Walk Time 6 minutes     # of Rest Breaks 0     MPH 2.17     METS 3.79     RPE 11     Perceived Dyspnea  0     VO2 Peak 13.27     Symptoms No     Resting HR 88 bpm     Resting BP 104/82     Resting Oxygen Saturation  98 %     Exercise Oxygen Saturation  during 6 min walk 98 %     Max Ex. HR 105 bpm     Max Ex. BP 111/71     2 Minute Post BP 117/81              Oxygen Initial Assessment:   Oxygen Re-Evaluation:   Oxygen Discharge (Final Oxygen Re-Evaluation):   Initial Exercise Prescription:  Initial Exercise Prescription - 09/25/22 1200       Date of Initial Exercise RX and Referring Provider   Date 09/25/22    Referring Provider Dr Melvyn Novas MD/ Dr Fransico Him MD, [place]    Expected Discharge Date 11/24/22      Bike   Level 2    Watts 70    Minutes 15    METs 3      Recumbant Elliptical   Level 2    RPM 65    Minutes 15    METs 3      Prescription Details   Frequency (times per week) 3    Duration Progress to 30 minutes of continuous aerobic without signs/symptoms of physical distress      Intensity   THRR 40-80% of Max Heartrate 78-156    Ratings of Perceived Exertion 11-13    Perceived Dyspnea 0-4      Progression   Progression Continue to progress workloads to maintain intensity without signs/symptoms of physical distress.      Resistance Training   Training Prescription Yes  Weight 4    Reps 10-15             Perform Capillary Blood Glucose checks as needed.  Exercise  Prescription Changes:   Exercise Prescription Changes     Row Name 09/29/22 1400 10/17/22 1500 10/24/22 1500         Response to Exercise   Blood Pressure (Admit) 118/60 104/80 108/70     Blood Pressure (Exercise) 134/68 132/80 134/70     Blood Pressure (Exit) 112/84 122/76 98/68     Heart Rate (Admit) 92 bpm 100 bpm 99 bpm     Heart Rate (Exercise) 119 bpm 138 bpm 143 bpm     Heart Rate (Exit) 112 bpm 109 bpm 110 bpm     Rating of Perceived Exertion (Exercise) '10 11 11     '$ Symptoms None None None     Comments Pt's first day in the CRP2 program- Reviewed METs Reviewed goals     Duration Continue with 30 min of aerobic exercise without signs/symptoms of physical distress. Continue with 30 min of aerobic exercise without signs/symptoms of physical distress. Continue with 30 min of aerobic exercise without signs/symptoms of physical distress.     Intensity THRR unchanged THRR unchanged THRR unchanged       Progression   Progression Continue to progress workloads to maintain intensity without signs/symptoms of physical distress. Continue to progress workloads to maintain intensity without signs/symptoms of physical distress. Continue to progress workloads to maintain intensity without signs/symptoms of physical distress.     Average METs 3.15 4.5 4.65       Resistance Training   Training Prescription Yes Yes Yes     Weight 4 5 lbs 5 lbs     Reps 10-15 10-15 10-15     Time 10 Minutes 10 Minutes 10 Minutes       Interval Training   Interval Training No No No       Bike   Level '2 4 4     '$ Watts 40 64 70     Minutes '15 15 15     '$ METs 3.7 5.1 5.3       Recumbant Elliptical   Level '2 5 5     '$ RPM 50 70 --     Watts 70 107 109     Minutes '15 15 15     '$ METs 2.6 3.9 4              Exercise Comments:   Exercise Comments     Row Name 09/29/22 1441 10/17/22 1522 10/24/22 1526       Exercise Comments Pt's first day in the CRP2 program. Pt felt workloads were too light, we  will adjust workloads for next session. Reviewed METs. Pt is making excellent progress with no complaints. Reviewed goals with patient today. Pt is progressing              Exercise Goals and Review:   Exercise Goals     Row Name 09/25/22 1322             Exercise Goals   Increase Physical Activity Yes       Intervention Provide advice, education, support and counseling about physical activity/exercise needs.;Develop an individualized exercise prescription for aerobic and resistive training based on initial evaluation findings, risk stratification, comorbidities and participant's personal goals.       Expected Outcomes Long Term: Exercising regularly at least 3-5 days a week.;Short Term: Attend rehab on a regular  basis to increase amount of physical activity.;Long Term: Add in home exercise to make exercise part of routine and to increase amount of physical activity.       Increase Strength and Stamina Yes       Intervention Provide advice, education, support and counseling about physical activity/exercise needs.;Develop an individualized exercise prescription for aerobic and resistive training based on initial evaluation findings, risk stratification, comorbidities and participant's personal goals.       Expected Outcomes Short Term: Increase workloads from initial exercise prescription for resistance, speed, and METs.;Short Term: Perform resistance training exercises routinely during rehab and add in resistance training at home;Long Term: Improve cardiorespiratory fitness, muscular endurance and strength as measured by increased METs and functional capacity (6MWT)       Able to understand and use rate of perceived exertion (RPE) scale Yes       Intervention Provide education and explanation on how to use RPE scale       Expected Outcomes Short Term: Able to use RPE daily in rehab to express subjective intensity level;Long Term:  Able to use RPE to guide intensity level when exercising  independently       Knowledge and understanding of Target Heart Rate Range (THRR) Yes       Intervention Provide education and explanation of THRR including how the numbers were predicted and where they are located for reference       Expected Outcomes Short Term: Able to state/look up THRR;Long Term: Able to use THRR to govern intensity when exercising independently;Short Term: Able to use daily as guideline for intensity in rehab       Understanding of Exercise Prescription Yes       Intervention Provide education, explanation, and written materials on patient's individual exercise prescription       Expected Outcomes Short Term: Able to explain program exercise prescription;Long Term: Able to explain home exercise prescription to exercise independently                Exercise Goals Re-Evaluation :  Exercise Goals Re-Evaluation     Row Name 09/29/22 1440 10/24/22 1524           Exercise Goal Re-Evaluation   Exercise Goals Review Increase Physical Activity;Increase Strength and Stamina;Able to understand and use rate of perceived exertion (RPE) scale;Knowledge and understanding of Target Heart Rate Range (THRR);Understanding of Exercise Prescription Increase Physical Activity;Increase Strength and Stamina;Able to understand and use rate of perceived exertion (RPE) scale;Knowledge and understanding of Target Heart Rate Range (THRR);Understanding of Exercise Prescription      Comments Pt's first day in the CRP2 program. Pt understnads the exercise RX, RPE scale and THRR. Reviewed goals with patient. Pt voices that his stamina has improved since beginning the program. Peak METs are 5.7. Weight loss is a goal, but has lost 1 kg.      Expected Outcomes Will continue to montior patient and progress exercise workloads as tolerated. Will continue to montior patient and progress exercise workloads as tolerated.               Discharge Exercise Prescription (Final Exercise Prescription  Changes):  Exercise Prescription Changes - 10/24/22 1500       Response to Exercise   Blood Pressure (Admit) 108/70    Blood Pressure (Exercise) 134/70    Blood Pressure (Exit) 98/68    Heart Rate (Admit) 99 bpm    Heart Rate (Exercise) 143 bpm    Heart Rate (Exit) 110 bpm  Rating of Perceived Exertion (Exercise) 11    Symptoms None    Comments Reviewed goals    Duration Continue with 30 min of aerobic exercise without signs/symptoms of physical distress.    Intensity THRR unchanged      Progression   Progression Continue to progress workloads to maintain intensity without signs/symptoms of physical distress.    Average METs 4.65      Resistance Training   Training Prescription Yes    Weight 5 lbs    Reps 10-15    Time 10 Minutes      Interval Training   Interval Training No      Bike   Level 4    Watts 70    Minutes 15    METs 5.3      Recumbant Elliptical   Level 5    Watts 109    Minutes 15    METs 4             Nutrition:  Target Goals: Understanding of nutrition guidelines, daily intake of sodium '1500mg'$ , cholesterol '200mg'$ , calories 30% from fat and 7% or less from saturated fats, daily to have 5 or more servings of fruits and vegetables.  Biometrics:  Pre Biometrics - 09/25/22 1216       Pre Biometrics   Waist Circumference 44.5 inches    Hip Circumference 47.25 inches    Waist to Hip Ratio 0.94 %    Triceps Skinfold 24 mm    % Body Fat 31.8 %    Grip Strength 46 kg    Flexibility 15.75 in    Single Leg Stand 30 seconds              Nutrition Therapy Plan and Nutrition Goals:  Nutrition Therapy & Goals - 09/29/22 1510       Nutrition Therapy   Diet Heart Healthy Diet      Personal Nutrition Goals   Nutrition Goal Patient to identify strategies for reducing cardiovascular risk by attending the [employer]ritikin education and nutrition series weekly.    Personal Goal #2 Patient to improve diet quality by using the plate method as a daily  guide for meal planning to including lean protein/plant protein, fruits, vegetables, whole gains and nonfat dairy.    Personal Goal #3 Patient to identify strategies for weight loss of 0.5-2.0#.    Comments Thomas Wood reports motivation to lose weight since gaining weight on prednisone post-operatively. He acknowledges over-snacking. He will benefit from participation in intensive cardiac rehab for nutrition, exercise and lifestyle modification.      Intervention Plan   Intervention Prescribe, educate and counsel regarding individualized specific dietary modifications aiming towards targeted core components such as weight, hypertension, lipid management, diabetes, heart failure and other comorbidities.;Nutrition handout(s) given to patient.    Expected Outcomes Short Term Goal: Understand basic principles of dietary content, such as calories, fat, sodium, cholesterol and nutrients.;Long Term Goal: Adherence to prescribed nutrition plan.             Nutrition Assessments:  Nutrition Assessments - 09/25/22 1215       Rate Your Plate Scores   Pre Score 57            MEDIFICTS Score Key: ?70 Need to make dietary changes  40-70 Heart Healthy Diet ? 40 Therapeutic Level Cholesterol Diet   Flowsheet Row INTENSIVE CARDIAC REHAB ORIENT from 09/25/2022 in [facility]  Picture Your Plate Total Score on  Admission 57      Picture Your Plate Scores: [id number] Unhealthy dietary pattern with much room for improvement. 41-50 Dietary pattern unlikely to meet recommendations for good health and room for improvement. 51-60 More healthful dietary pattern, with some room for improvement.  >60 Healthy dietary pattern, although there may be some specific behaviors that could be improved.    Nutrition Goals Re-Evaluation:  Nutrition Goals Re-Evaluation     [place] Name 09/29/22 1510             Goals   Current Weight 234 lb 5.6 oz (106.3 kg)        Expected Outcome Thomas Wood reports motivation to lose weight since gaining weight on prednisone post-operatively. He acknowledges over-snacking. He will benefit from participation in intensive cardiac rehab for nutrition, exercise and lifestyle modification.                Nutrition Goals Re-Evaluation:  Nutrition Goals Re-Evaluation     [place] Name 09/29/22 1510             Goals   Current Weight 234 lb 5.6 oz (106.3 kg)       Expected Outcome Thomas Wood reports motivation to lose weight since gaining weight on prednisone post-operatively. He acknowledges over-snacking. He will benefit from participation in intensive cardiac rehab for nutrition, exercise and lifestyle modification.                Nutrition Goals Discharge (Final Nutrition Goals Re-Evaluation):  Nutrition Goals Re-Evaluation - 09/29/22 1510       Goals   Current Weight 234 lb 5.6 oz (106.3 kg)    Expected Outcome Thomas Wood reports motivation to lose weight since gaining weight on prednisone post-operatively. He acknowledges over-snacking. He will benefit from participation in intensive cardiac rehab for nutrition, exercise and lifestyle modification.             Psychosocial: Target Goals: Acknowledge presence or absence of significant depression and/or stress, maximize coping skills, provide positive support system. Participant is able to verbalize types and ability to use techniques and skills needed for reducing stress and depression.  Initial Review & Psychosocial Screening:  Initial Psych Review & Screening - 09/25/22 1323       Initial Review   Current issues with None Identified      Family Dynamics   Good Support System? Yes   Thomas Wood has his wife, and Mom for support     Barriers   Psychosocial barriers to participate in program There are no identifiable barriers or psychosocial needs.;The patient should benefit from training in stress management and relaxation.      Screening Interventions    Interventions Encouraged to exercise             Quality of Life Scores:  Quality of Life - 09/25/22 1224       Quality of Life   Select Quality of Life      Quality of Life Scores   Health/Function Pre 26.8 %    Socioeconomic Pre 20.25 %    Psych/Spiritual Pre 24.86 %    Family Pre 26.4 %    GLOBAL Pre 24.86 %            Scores of 19 and below usually indicate a poorer quality of life in these areas.  A difference of  2-3 points is a clinically meaningful difference.  A difference of 2-3 points in the total score of the Quality of Life Index has been associated with significant  improvement in overall quality of life, self-image, physical symptoms, and general health in studies assessing change in quality of life.  PHQ-9: Review Flowsheet       09/25/2022 08/12/2021 09/18/2017 08/11/2016  Depression screen PHQ 2/9  Decreased Interest 0 0 0 0  Down, Depressed, Hopeless 0 0 0 0  PHQ - 2 Score 0 0 0 0  Altered sleeping 0 - - -  Tired, decreased energy 0 - - -  Change in appetite 0 - - -  Feeling bad or failure about yourself  0 - - -  Trouble concentrating 0 - - -  Moving slowly or fidgety/restless 0 - - -  Suicidal thoughts 0 - - -  PHQ-9 Score 0 - - -   Interpretation of Total Score  Total Score Depression Severity:  1-4 = Minimal depression, 5-9 = Mild depression, 10-14 = Moderate depression, 15-19 = Moderately severe depression, 20-27 = Severe depression   Psychosocial Evaluation and Intervention:   Psychosocial Re-Evaluation:  Psychosocial Re-Evaluation     Row Name 09/29/22 1738 10/28/22 [id number]           Psychosocial Re-Evaluation   Current issues with None Identified None Identified      Comments -- No psychosocial need identified at this time      Interventions Encouraged to attend Cardiac Rehabilitation for the exercise Encouraged to attend Cardiac Rehabilitation for the exercise      Continue Psychosocial Services  No Follow up required No Follow  up required               Psychosocial Discharge (Final Psychosocial Re-Evaluation):  Psychosocial Re-Evaluation - 10/28/22 0929       Psychosocial Re-Evaluation   Current issues with None Identified    Comments No psychosocial need identified at this time    Interventions Encouraged to attend Cardiac Rehabilitation for the exercise    Continue Psychosocial Services  No Follow up required             Vocational Rehabilitation: Provide vocational rehab assistance to qualifying candidates.   Vocational Rehab Evaluation & Intervention:  Vocational Rehab - 09/25/22 1324       Initial Vocational Rehab Evaluation & Intervention   Assessment shows need for Vocational Rehabilitation No   Thomas Wood is currently not working due to his recent heart transplant surgery            Education: Education Goals: Education classes will be provided on a weekly basis, covering required topics. Participant will state understanding/return demonstration of topics presented.    Education     Row Name 09/29/22 1600     Education   Cardiac Education Topics [place]   Environmental consultant Exercise   Exercise Workshop Exercise Basics: Building Your Action Plan   Instruction Review Code 1- Verbalizes Understanding   Class Start Time 1359   Class Stop Time 1445   Class Time Calculation (min) 46 min    [place] Name 10/01/22 1600     Education   Cardiac Education Topics Pritikin   Financial trader   Weekly Topic Efficiency Cooking - Meals in a Snap   Instruction Review Code 1- Verbalizes Understanding   Class Start Time 1355   Class Stop Time 1437   Class Time Calculation (min) 42 min    [place] Name 10/03/22 1500     Education  Cardiac Education Topics Pritikin   Select Core Videos     Core Videos   Educator Exercise Physiologist   Select Exercise Education   Exercise Education Move  It!   Instruction Review Code 1- Verbalizes Understanding   Class Start Time 1405   Class Stop Time 1442   Class Time Calculation (min) 37 min    Row Name 10/06/22 1629     Education   Cardiac Education Topics Pritikin     Core Videos   Educator Dietitian   Select Nutrition   Instruction Review Code 1- Verbalizes Understanding   Class Start Time 1400   Class Stop Time 1447   Class Time Calculation (min) 47 min    [place] Name 10/10/22 1600     Education   Cardiac Education Topics Pritikin   Architect Education   General Education Hypertension and Heart Disease   Instruction Review Code 1- Verbalizes Understanding   Class Start Time 1410   Class Stop Time 1450   Class Time Calculation (min) 40 min    [place] Name 10/13/22 1500     Education   Cardiac Education Topics Pritikin   Environmental consultant Psychosocial   Psychosocial Workshop Healthy Sleep for a Healthy Heart   Instruction Review Code 1- Verbalizes Understanding   Class Start Time 1400   Class Stop Time 1450   Class Time Calculation (min) 50 min    [place] Name 10/15/22 1500     Education   Cardiac Education Topics Pritikin   Financial trader   Weekly Topic Comforting Weekend Breakfasts   Instruction Review Code 1- Verbalizes Understanding   Class Start Time 1400   Class Stop Time 1445   Class Time Calculation (min) 45 min    [place] Name 10/17/22 1500     Education   Cardiac Education Topics Pritikin   Lexicographer Nutrition   Nutrition Dining Out - Part 1   Instruction Review Code 1- Verbalizes Understanding   Class Start Time 1405   Class Stop Time 1440   Class Time Calculation (min) 35 min            Core Videos: Exercise    Move It!  Clinical staff conducted group  or individual video education with verbal and written material and guidebook.  Patient learns the recommended Pritikin exercise program. Exercise with the goal of living a long, healthy life. Some of the health benefits of exercise include controlled diabetes, healthier blood pressure levels, improved cholesterol levels, improved heart and lung capacity, improved sleep, and better body composition. Everyone should speak with their doctor before starting or changing an exercise routine.  Biomechanical Limitations Clinical staff conducted group or individual video education with verbal and written material and guidebook.  Patient learns how biomechanical limitations can impact exercise and how we can mitigate and possibly overcome limitations to have an impactful and balanced exercise routine.  Body Composition Clinical staff conducted group or individual video education with verbal and written material and guidebook.  Patient learns that body composition (ratio of muscle mass to fat mass) is a key component to assessing overall fitness, rather than body weight alone. Increased fat mass, especially visceral belly fat,  can put [place] at increased risk for metabolic syndrome, type 2 diabetes, heart disease, and even death. It is recommended to combine diet and exercise (cardiovascular and resistance training) to improve your body composition. Seek guidance from your physician and exercise physiologist before implementing an exercise routine.  Exercise Action Plan Clinical staff conducted group or individual video education with verbal and written material and guidebook.  Patient learns the recommended strategies to achieve and enjoy long-term exercise adherence, including variety, self-motivation, self-efficacy, and positive decision making. Benefits of exercise include fitness, good health, weight management, more energy, better sleep, less stress, and overall well-being.  Medical   Heart Disease Risk  Reduction Clinical staff conducted group or individual video education with verbal and written material and guidebook.  Patient learns our heart is our most vital organ as it circulates oxygen, nutrients, white blood cells, and hormones throughout the entire body, and carries waste away. Data supports a plant-based eating plan like the Pritikin Program for its effectiveness in slowing progression of and reversing heart disease. The video provides a number of recommendations to address heart disease.   Metabolic Syndrome and Belly Fat  Clinical staff conducted group or individual video education with verbal and written material and guidebook.  Patient learns what metabolic syndrome is, how it leads to heart disease, and how one can reverse it and keep it from coming back. You have metabolic syndrome if you have 3 of the following 5 criteria: abdominal obesity, high blood pressure, high triglycerides, low HDL cholesterol, and high blood sugar.  Hypertension and Heart Disease Clinical staff conducted group or individual video education with verbal and written material and guidebook.  Patient learns that high blood pressure, or hypertension, is very common in the [place]. Hypertension is largely due to excessive salt intake, but other important risk factors include being overweight, physical inactivity, drinking too much alcohol, smoking, and not eating enough potassium from fruits and vegetables. High blood pressure is a leading risk factor for heart attack, stroke, congestive heart failure, dementia, kidney failure, and premature death. Long-term effects of excessive salt intake include stiffening of the arteries and thickening of heart muscle and organ damage. Recommendations include ways to reduce hypertension and the risk of heart disease.  Diseases of Our Time - Focusing on Diabetes Clinical staff conducted group or individual video education with verbal and written material and guidebook.   Patient learns why the best way to stop diseases of our time is prevention, through food and other lifestyle changes. Medicine (such as prescription pills and surgeries) is often only a Band-Aid on the problem, not a long-term solution. Most common diseases of our time include obesity, type 2 diabetes, hypertension, heart disease, and cancer. The Pritikin Program is recommended and has been proven to help reduce, reverse, and/or prevent the damaging effects of metabolic syndrome.  Nutrition   Overview of the Pritikin Eating Plan  Clinical staff conducted group or individual video education with verbal and written material and guidebook.  Patient learns about the [place] for disease risk reduction. The [place] emphasizes a wide variety of unrefined, minimally-processed carbohydrates, like fruits, vegetables, whole grains, and legumes. Go, Caution, and Stop food choices are explained. Plant-based and lean animal proteins are emphasized. Rationale provided for low sodium intake for blood pressure control, low added sugars for blood sugar stabilization, and low added fats and oils for coronary artery disease risk reduction and weight management.  Calorie Density  Clinical staff conducted group or individual  video education with verbal and written material and guidebook.  Patient learns about calorie density and how it impacts the Pritikin Eating Plan. Knowing the characteristics of the food you choose will help you decide whether those foods will lead to weight gain or weight loss, and whether you want to consume more or less of them. Weight loss is usually a side effect of the Pritikin Eating Plan because of its focus on low calorie-dense foods.  Label Reading  Clinical staff conducted group or individual video education with verbal and written material and guidebook.  Patient learns about the Pritikin recommended label reading guidelines and corresponding recommendations  regarding calorie density, added sugars, sodium content, and whole grains.  Dining Out - Part 1  Clinical staff conducted group or individual video education with verbal and written material and guidebook.  Patient learns that restaurant meals can be sabotaging because they can be so high in calories, fat, sodium, and/or sugar. Patient learns recommended strategies on how to positively address this and avoid unhealthy pitfalls.  Facts on Fats  Clinical staff conducted group or individual video education with verbal and written material and guidebook.  Patient learns that lifestyle modifications can be just as effective, if not more so, as many medications for lowering your risk of heart disease. A Pritikin lifestyle can help to reduce your risk of inflammation and atherosclerosis (cholesterol build-up, or plaque, in the artery walls). Lifestyle interventions such as dietary choices and physical activity address the cause of atherosclerosis. A review of the types of fats and their impact on blood cholesterol levels, along with dietary recommendations to reduce fat intake is also included.  Nutrition Action Plan  Clinical staff conducted group or individual video education with verbal and written material and guidebook.  Patient learns how to incorporate Pritikin recommendations into their lifestyle. Recommendations include planning and keeping personal health goals in mind as an important part of their success.  Healthy Mind-Set    Healthy Minds, Bodies, Hearts  Clinical staff conducted group or individual video education with verbal and written material and guidebook.  Patient learns how to identify when they are stressed. Video will discuss the impact of that stress, as well as the many benefits of stress management. Patient will also be introduced to stress management techniques. The way we think, act, and feel has an impact on our hearts.  How Our Thoughts Can Heal Our Hearts  Clinical staff  conducted group or individual video education with verbal and written material and guidebook.  Patient learns that negative thoughts can cause depression and anxiety. This can result in negative lifestyle behavior and serious health problems. Cognitive behavioral therapy is an effective method to help control our thoughts in order to change and improve our emotional outlook.  Additional Videos:  Exercise    Improving Performance  Clinical staff conducted group or individual video education with verbal and written material and guidebook.  Patient learns to use a non-linear approach by alternating intensity levels and lengths of time spent exercising to help burn more calories and lose more body fat. Cardiovascular exercise helps improve heart health, metabolism, hormonal balance, blood sugar control, and recovery from fatigue. Resistance training improves strength, endurance, balance, coordination, reaction time, metabolism, and muscle mass. Flexibility exercise improves circulation, posture, and balance. Seek guidance from your physician and exercise physiologist before implementing an exercise routine and learn your capabilities and proper form for all exercise.  Introduction to Yoga  Clinical staff conducted group or individual video education with  verbal and written material and guidebook.  Patient learns about yoga, a discipline of the coming together of mind, breath, and body. The benefits of yoga include improved flexibility, improved range of motion, better posture and core strength, increased lung function, weight loss, and positive self-image. Yoga's heart health benefits include lowered blood pressure, healthier heart rate, decreased cholesterol and triglyceride levels, improved immune function, and reduced stress. Seek guidance from your physician and exercise physiologist before implementing an exercise routine and learn your capabilities and proper form for all exercise.  Medical   Aging:  Enhancing Your Quality of Life  Clinical staff conducted group or individual video education with verbal and written material and guidebook.  Patient learns key strategies and recommendations to stay in good physical health and enhance quality of life, such as prevention strategies, having an advocate, securing a [place], and keeping a list of medications and system for tracking them. It also discusses how to avoid risk for bone loss.  Biology of Weight Control  Clinical staff conducted group or individual video education with verbal and written material and guidebook.  Patient learns that weight gain occurs because we consume more calories than we burn (eating more, moving less). Even if your body weight is normal, you may have higher ratios of fat compared to muscle mass. Too much body fat puts you at increased risk for cardiovascular disease, heart attack, stroke, type 2 diabetes, and obesity-related cancers. In addition to exercise, following the [place] can help reduce your risk.  Decoding Lab Results  Clinical staff conducted group or individual video education with verbal and written material and guidebook.  Patient learns that lab test reflects one measurement whose values change over time and are influenced by many factors, including medication, stress, sleep, exercise, food, hydration, pre-existing medical conditions, and more. It is recommended to use the knowledge from this video to become more involved with your lab results and evaluate your numbers to speak with your doctor.   Diseases of Our Time - Overview  Clinical staff conducted group or individual video education with verbal and written material and guidebook.  Patient learns that according to the CDC, 50% to 70% of chronic diseases (such as obesity, type 2 diabetes, elevated lipids, hypertension, and heart disease) are avoidable through lifestyle improvements including healthier food  choices, listening to satiety cues, and increased physical activity.  Sleep Disorders Clinical staff conducted group or individual video education with verbal and written material and guidebook.  Patient learns how good quality and duration of sleep are important to overall health and well-being. Patient also learns about sleep disorders and how they impact health along with recommendations to address them, including discussing with a physician.  Nutrition  Dining Out - Part 2 Clinical staff conducted group or individual video education with verbal and written material and guidebook.  Patient learns how to plan ahead and communicate in order to maximize their dining experience in a healthy and nutritious manner. Included are recommended food choices based on the type of restaurant the patient is visiting.   Fueling a Best boy conducted group or individual video education with verbal and written material and guidebook.  There is a strong connection between our food choices and our health. Diseases like obesity and type 2 diabetes are very prevalent and are in large-part due to lifestyle choices. The Pritikin Eating Plan provides plenty of food and hunger-curbing satisfaction. It is easy to follow, affordable,  and helps reduce health risks.  Menu Workshop  Clinical staff conducted group or individual video education with verbal and written material and guidebook.  Patient learns that restaurant meals can sabotage health goals because they are often packed with calories, fat, sodium, and sugar. Recommendations include strategies to plan ahead and to communicate with the manager, chef, or server to help order a healthier meal.  Planning Your Eating Strategy  Clinical staff conducted group or individual video education with verbal and written material and guidebook.  Patient learns about the [place] and its benefit of reducing the risk of disease. The [place] does not focus on calories. Instead, it emphasizes high-quality, nutrient-rich foods. By knowing the characteristics of the foods, we choose, we can determine their calorie density and make informed decisions.  Targeting Your Nutrition Priorities  Clinical staff conducted group or individual video education with verbal and written material and guidebook.  Patient learns that lifestyle habits have a tremendous impact on disease risk and progression. This video provides eating and physical activity recommendations based on your personal health goals, such as reducing LDL cholesterol, losing weight, preventing or controlling type 2 diabetes, and reducing high blood pressure.  Vitamins and Minerals  Clinical staff conducted group or individual video education with verbal and written material and guidebook.  Patient learns different ways to obtain key vitamins and minerals, including through a recommended healthy diet. It is important to discuss all supplements you take with your doctor.   Healthy Mind-Set    Smoking Cessation  Clinical staff conducted group or individual video education with verbal and written material and guidebook.  Patient learns that cigarette smoking and tobacco addiction pose a serious health risk which affects millions of people. Stopping smoking will significantly reduce the risk of heart disease, lung disease, and many forms of cancer. Recommended strategies for quitting are covered, including working with your doctor to develop a successful plan.  Culinary   Becoming a Financial trader conducted group or individual video education with verbal and written material and guidebook.  Patient learns that cooking at home can be healthy, cost-effective, quick, and puts them in control. Keys to cooking healthy recipes will include looking at your recipe, assessing your equipment needs, planning ahead, making it simple, choosing cost-effective seasonal ingredients,  and limiting the use of added fats, salts, and sugars.  Cooking - Breakfast and Snacks  Clinical staff conducted group or individual video education with verbal and written material and guidebook.  Patient learns how important breakfast is to satiety and nutrition through the entire day. Recommendations include key foods to eat during breakfast to help stabilize blood sugar levels and to prevent overeating at meals later in the day. Planning ahead is also a key component.  Cooking - Human resources officer conducted group or individual video education with verbal and written material and guidebook.  Patient learns eating strategies to improve overall health, including an approach to cook more at home. Recommendations include thinking of animal protein as a side on your plate rather than center stage and focusing instead on lower calorie dense options like vegetables, fruits, whole grains, and plant-based proteins, such as beans. Making sauces in large quantities to freeze for later and leaving the skin on your vegetables are also recommended to maximize your experience.  Cooking - Healthy Salads and Dressing Clinical staff conducted group or individual video education with verbal and written material and guidebook.  Patient learns that  vegetables, fruits, whole grains, and legumes are the foundations of the [place]. Recommendations include how to incorporate each of these in flavorful and healthy salads, and how to create homemade salad dressings. Proper handling of ingredients is also covered. Cooking - Soups and [employer] - Soups and Desserts Clinical staff conducted group or individual video education with verbal and written material and guidebook.  Patient learns that Pritikin soups and desserts make for easy, nutritious, and delicious snacks and meal components that are low in sodium, fat, sugar, and calorie density, while high in vitamins, minerals, and filling  fiber. Recommendations include simple and healthy ideas for soups and desserts.   Overview     The Pritikin Solution Program Overview Clinical staff conducted group or individual video education with verbal and written material and guidebook.  Patient learns that the results of the [place] Program have been documented in more than 100 articles published in peer-reviewed journals, and the benefits include reducing risk factors for (and, in some cases, even reversing) high cholesterol, high blood pressure, type 2 diabetes, obesity, and more! An overview of the three key pillars of the Pritikin Program will be covered: eating well, doing regular exercise, and having a healthy mind-set.  WORKSHOPS  Exercise: Exercise Basics: Building Your Action Plan Clinical staff led group instruction and group discussion with PowerPoint presentation and patient guidebook. To enhance the learning environment the use of posters, models and videos may be added. At the conclusion of this workshop, patients will comprehend the difference between physical activity and exercise, as well as the benefits of incorporating both, into their routine. Patients will understand the FITT (Frequency, Intensity, Time, and Type) principle and how to use it to build an exercise action plan. In addition, safety concerns and other considerations for exercise and cardiac rehab will be addressed by the presenter. The purpose of this lesson is to promote a comprehensive and effective weekly exercise routine in order to improve patients' overall level of fitness.   Managing Heart Disease: Your Path to a Healthier Heart Clinical staff led group instruction and group discussion with PowerPoint presentation and patient guidebook. To enhance the learning environment the use of posters, models and videos may be added.At the conclusion of this workshop, patients will understand the anatomy and physiology of the heart. Additionally, they will  understand how Pritikin's three pillars impact the risk factors, the progression, and the management of heart disease.  The purpose of this lesson is to provide a high-level overview of the heart, heart disease, and how the Pritikin lifestyle positively impacts risk factors.  Exercise Biomechanics Clinical staff led group instruction and group discussion with PowerPoint presentation and patient guidebook. To enhance the learning environment the use of posters, models and videos may be added. Patients will learn how the structural parts of their bodies function and how these functions impact their daily activities, movement, and exercise. Patients will learn how to promote a neutral spine, learn how to manage pain, and identify ways to improve their physical movement in order to promote healthy living. The purpose of this lesson is to expose patients to common physical limitations that impact physical activity. Participants will learn practical ways to adapt and manage aches and pains, and to minimize their effect on regular exercise. Patients will learn how to maintain good posture while sitting, walking, and lifting.  Balance Training and Fall Prevention  Clinical staff led group instruction and group discussion with PowerPoint presentation and patient guidebook. To enhance the  learning environment the use of posters, models and videos may be added. At the conclusion of this workshop, patients will understand the importance of their sensorimotor skills (vision, proprioception, and the vestibular system) in maintaining their ability to balance as they age. Patients will apply a variety of balancing exercises that are appropriate for their current level of function. Patients will understand the common causes for poor balance, possible solutions to these problems, and ways to modify their physical environment in order to minimize their fall risk. The purpose of this lesson is to teach patients  about the importance of maintaining balance as they age and ways to minimize their risk of falling.  WORKSHOPS   Nutrition:  Fueling a Scientist, research (physical sciences) led group instruction and group discussion with PowerPoint presentation and patient guidebook. To enhance the learning environment the use of posters, models and videos may be added. Patients will review the foundational principles of the [place] and understand what constitutes a serving size in each of the food groups. Patients will also learn Pritikin-friendly foods that are better choices when away from home and review make-ahead meal and snack options. Calorie density will be reviewed and applied to three nutrition priorities: weight maintenance, weight loss, and weight gain. The purpose of this lesson is to reinforce (in a group setting) the key concepts around what patients are recommended to eat and how to apply these guidelines when away from home by planning and selecting Pritikin-friendly options. Patients will understand how calorie density may be adjusted for different weight management goals.  Mindful Eating  Clinical staff led group instruction and group discussion with PowerPoint presentation and patient guidebook. To enhance the learning environment the use of posters, models and videos may be added. Patients will briefly review the concepts of the [place] and the importance of low-calorie dense foods. The concept of mindful eating will be introduced as well as the importance of paying attention to internal hunger signals. Triggers for non-hunger eating and techniques for dealing with triggers will be explored. The purpose of this lesson is to provide patients with the opportunity to review the basic principles of the [place], discuss the value of eating mindfully and how to measure internal cues of hunger and fullness using the Hunger Scale. Patients will also discuss reasons for non-hunger  eating and learn strategies to use for controlling emotional eating.  Targeting Your Nutrition Priorities Clinical staff led group instruction and group discussion with PowerPoint presentation and patient guidebook. To enhance the learning environment the use of posters, models and videos may be added. Patients will learn how to determine their genetic susceptibility to disease by reviewing their family history. Patients will gain insight into the importance of diet as part of an overall healthy lifestyle in mitigating the impact of genetics and other environmental insults. The purpose of this lesson is to provide patients with the opportunity to assess their personal nutrition priorities by looking at their family history, their own health history and current risk factors. Patients will also be able to discuss ways of prioritizing and modifying the [place] for their highest risk areas  Menu  Clinical staff led group instruction and group discussion with PowerPoint presentation and patient guidebook. To enhance the learning environment the use of posters, models and videos may be added. Using menus brought in from [employer], or printed from [employer], patients will apply the [place] dining out guidelines that were presented in the [employer]  Out educational video. Patients will also be able to practice these guidelines in a variety of provided scenarios. The purpose of this lesson is to provide patients with the opportunity to practice hands-on learning of the [place] with actual menus and practice scenarios.  Label Reading Clinical staff led group instruction and group discussion with PowerPoint presentation and patient guidebook. To enhance the learning environment the use of posters, models and videos may be added. Patients will review and discuss the Pritikin label reading guidelines presented in Pritikin's Label Reading Educational series video.  Using fool labels brought in from local grocery stores and markets, patients will apply the label reading guidelines and determine if the packaged food meet the Pritikin guidelines. The purpose of this lesson is to provide patients with the opportunity to review, discuss, and practice hands-on learning of the Pritikin Label Reading guidelines with actual packaged food labels. [place] Workshops are designed to teach patients ways to prepare quick, simple, and affordable recipes at home. The importance of nutrition's role in chronic disease risk reduction is reflected in its emphasis in the overall Pritikin program. By learning how to prepare essential core [employer] Eating Plan recipes, patients will increase control over what they eat; be able to customize the flavor of foods without the use of added salt, sugar, or fat; and improve the quality of the food they consume. By learning a set of core recipes which are easily assembled, quickly prepared, and affordable, patients are more likely to prepare more healthy foods at home. These workshops focus on convenient breakfasts, simple entres, side dishes, and desserts which can be prepared with minimal effort and are consistent with nutrition recommendations for cardiovascular risk reduction. Cooking [employer] are taught by a Engineer, materials (RD) who has been trained by the [employer]. The chef or RD has a clear understanding of the importance of minimizing - if not completely eliminating - added fat, sugar, and sodium in recipes. Throughout the series of [place] Workshop sessions, patients will learn about healthy ingredients and efficient methods of cooking to build confidence in their capability to prepare    Cooking School weekly topics:  Adding Flavor- Sodium-Free  Fast and Healthy Breakfasts  Powerhouse Plant-Based Proteins  Satisfying Salads and Dressings  Simple Sides and  Sauces  International Cuisine-Spotlight on the [place] Zones  Delicious Desserts  Savory Soups  Efficiency Cooking - Meals in a Snap  Tasty Appetizers and Snacks  Comforting Weekend Breakfasts  One-Pot Wonders   Fast Evening Meals  Easy [place] (Psychosocial): New Thoughts, New Behaviors Clinical staff led group instruction and group discussion with PowerPoint presentation and patient guidebook. To enhance the learning environment the use of posters, models and videos may be added. Patients will learn and practice techniques for developing effective health and lifestyle goals. Patients will be able to effectively apply the goal setting process learned to develop at least one new personal goal.  The purpose of this lesson is to expose patients to a new skill set of behavior modification techniques such as techniques setting SMART goals, overcoming barriers, and achieving new thoughts and new behaviors.  Managing Moods and Relationships Clinical staff led group instruction and group discussion with PowerPoint presentation and patient guidebook. To enhance the learning environment the use of posters, models and videos may be added. Patients will learn how emotional and chronic stress factors can  impact their health and relationships. They will learn healthy ways to manage their moods and utilize positive coping mechanisms. In addition, I[facility] patients will learn ways to improve communication skills. The purpose of this lesson is to expose patients to ways of understanding how one's mood and health are intimately connected. Developing a healthy outlook can help build positive relationships and connections with others. Patients will understand the importance of utilizing effective communication skills that include actively listening and being heard. They will learn and understand the importance of the "4 Cs" and especially Connections in  fostering of a Healthy Mind-Set.  Healthy Sleep for a Healthy Heart Clinical staff led group instruction and group discussion with PowerPoint presentation and patient guidebook. To enhance the learning environment the use of posters, models and videos may be added. At the conclusion of this workshop, patients will be able to demonstrate knowledge of the importance of sleep to overall health, well-being, and quality of life. They will understand the symptoms of, and treatments for, common sleep disorders. Patients will also be able to identify daytime and nighttime behaviors which impact sleep, and they will be able to apply these tools to help manage sleep-related challenges. The purpose of this lesson is to provide patients with a general overview of sleep and outline the importance of quality sleep. Patients will learn about a few of the most common sleep disorders. Patients will also be introduced to the concept of "sleep hygiene," and discover ways to self-manage certain sleeping problems through simple daily behavior changes. Finally, the workshop will motivate patients by clarifying the links between quality sleep and their goals of heart-healthy living.   Recognizing and Reducing Stress Clinical staff led group instruction and group discussion with PowerPoint presentation and patient guidebook. To enhance the learning environment the use of posters, models and videos may be added. At the conclusion of this workshop, patients will be able to understand the types of stress reactions, differentiate between acute and chronic stress, and recognize the impact that chronic stress has on their health. They will also be able to apply different coping mechanisms, such as reframing negative self-talk. Patients will have the opportunity to practice a variety of stress management techniques, such as deep abdominal breathing, progressive muscle relaxation, and/or guided imagery.  The purpose of this lesson is to  educate patients on the role of stress in their lives and to provide healthy techniques for coping with it.  Learning Barriers/Preferences:   Education Topics:  Knowledge Questionnaire Score:  Knowledge Questionnaire Score - 09/25/22 1225       Knowledge Questionnaire Score   Pre Score 20/24             Core Components/Risk Factors/Patient Goals at Admission:  Personal Goals and Risk Factors at Admission - 09/25/22 1225       Core Components/Risk Factors/Patient Goals on Admission    Weight Management Yes;Obesity;Weight Loss    Intervention Weight Management: Develop a combined nutrition and exercise program designed to reach desired caloric intake, while maintaining appropriate intake of nutrient and fiber, sodium and fats, and appropriate energy expenditure required for the weight goal.;Weight Management: Provide education and appropriate resources to help participant work on and attain dietary goals.;Weight Management/Obesity: Establish reasonable short term and long term weight goals.;Obesity: Provide education and appropriate resources to help participant work on and attain dietary goals.    Admit Weight 232 lb 5.8 oz (105.4 kg)    Goal Weight: Long Term 180 lb (81.6 kg)  Expected Outcomes Long Term: Adherence to nutrition and physical activity/exercise program aimed toward attainment of established weight goal;Short Term: Continue to assess and modify interventions until short term weight is achieved;Weight Loss: Understanding of general recommendations for a balanced deficit meal plan, which promotes 1-2 lb weight loss per week and includes a negative energy balance of [phone] kcal/d;Understanding of distribution of calorie intake throughout the day with the consumption of 4-5 meals/snacks;Understanding recommendations for meals to include 15-35% energy as protein, 25-35% energy from fat, 35-60% energy from carbohydrates, less than '200mg'$  of dietary cholesterol, 20-35 gm of  total fiber daily    Hypertension Yes    Intervention Provide education on lifestyle modifcations including regular physical activity/exercise, weight management, moderate sodium restriction and increased consumption of fresh fruit, vegetables, and low fat dairy, alcohol moderation, and smoking cessation.;Monitor prescription use compliance.    Expected Outcomes Short Term: Continued assessment and intervention until BP is < 140/71m HG in hypertensive participants. < 130/816mHG in hypertensive participants with diabetes, heart failure or chronic kidney disease.;Long Term: Maintenance of blood pressure at goal levels.    Lipids Yes    Intervention Provide education and support for participant on nutrition & aerobic/resistive exercise along with prescribed medications to achieve LDL '70mg'$ , HDL >'40mg'$ .    Expected Outcomes Short Term: Participant states understanding of desired cholesterol values and is compliant with medications prescribed. Participant is following exercise prescription and nutrition guidelines.;Long Term: Cholesterol controlled with medications as prescribed, with individualized exercise RX and with personalized nutrition plan. Value goals: LDL < '70mg'$ , HDL > 40 mg.             Core Components/Risk Factors/Patient Goals Review:   Goals and Risk Factor Review     Row Name 09/29/22 1738 10/28/22 0931           Core Components/Risk Factors/Patient Goals Review   Personal Goals Review Weight Management/Obesity;Hypertension;Lipids Weight Management/Obesity;Hypertension;Lipids      Review NiMerrilee Seashoretarted intensvie cardiac rehab on 09/29/22 and did well with exercise NiMerrilee [facility]ores [facility]. Vital signs have been stable.      Expected Outcomes NiMerrilee Seashoreill continue to participate in intesive cardiac rehab for exercise, nutrition and lifestyle modifications NiMerrilee Seashoreill continue to participate in intesive cardiac rehab for exercise, nutrition and lifestyle  modifications               Core Components/Risk Factors/Patient Goals at Discharge (Final Review):   Goals and Risk Factor Review - 10/28/22 0931       Core Components/Risk Factors/Patient Goals Review   Personal Goals Review Weight Management/Obesity;Hypertension;Lipids    [facility] NiMerrilee [facility]ores [facility] Vital signs have been stable.    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 Introduction to pritikin education program/ intensive cardiac rehab. Initital orientation packet reviewed with patient. 30 day ITP Review. NiMerrilee Seashoretarted intensive cardiac rehab on 09/29/22 and did well with exercise. 30 day ITP Review. NiMerrilee Seashoreas good attendance and participation in intensive cardiac rehab              Comments: See ITP comments.

## 2022-10-29 ENCOUNTER — Encounter (HOSPITAL_COMMUNITY)
Admission: RE | Admit: 2022-10-29 | Discharge: 2022-10-29 | Disposition: A | Discharge status: Home / Self Care | Payer: 59 | Source: Ambulatory Visit | Attending: Cardiology | Admitting: Cardiology

## 2022-10-29 DIAGNOSIS — Z941 Heart transplant status: Secondary | ICD-10-CM | POA: Diagnosis not present

## 2022-10-31 ENCOUNTER — Encounter (HOSPITAL_COMMUNITY): Payer: 59

## 2022-11-03 ENCOUNTER — Encounter (HOSPITAL_COMMUNITY)
Admission: RE | Admit: 2022-11-03 | Discharge: 2022-11-03 | Disposition: A | Discharge status: Home / Self Care | Payer: 59 | Source: Ambulatory Visit | Attending: Cardiology | Admitting: Cardiology

## 2022-11-03 DIAGNOSIS — Z48812 Encounter for surgical aftercare following surgery on the circulatory system: Secondary | ICD-10-CM | POA: Insufficient documentation

## 2022-11-03 DIAGNOSIS — Z941 Heart transplant status: Secondary | ICD-10-CM

## 2022-11-05 ENCOUNTER — Encounter (HOSPITAL_COMMUNITY)
Admission: RE | Admit: 2022-11-05 | Discharge: 2022-11-05 | Disposition: A | Discharge status: Home / Self Care | Payer: 59 | Source: Ambulatory Visit | Attending: Cardiology | Admitting: Cardiology

## 2022-11-05 DIAGNOSIS — Z941 Heart transplant status: Secondary | ICD-10-CM

## 2022-11-05 DIAGNOSIS — Z48812 Encounter for surgical aftercare following surgery on the circulatory system: Secondary | ICD-10-CM | POA: Diagnosis not present

## 2022-11-07 ENCOUNTER — Encounter (HOSPITAL_COMMUNITY)
Admission: RE | Admit: 2022-11-07 | Discharge: 2022-11-07 | Disposition: A | Discharge status: Home / Self Care | Payer: 59 | Source: Ambulatory Visit | Attending: Cardiology | Admitting: Cardiology

## 2022-11-07 DIAGNOSIS — Z941 Heart transplant status: Secondary | ICD-10-CM

## 2022-11-07 DIAGNOSIS — Z48812 Encounter for surgical aftercare following surgery on the circulatory system: Secondary | ICD-10-CM | POA: Diagnosis not present

## 2022-11-10 ENCOUNTER — Encounter (HOSPITAL_COMMUNITY): Payer: 59

## 2022-11-12 ENCOUNTER — Encounter (HOSPITAL_COMMUNITY)
Admission: RE | Admit: 2022-11-12 | Discharge: 2022-11-12 | Disposition: A | Discharge status: Home / Self Care | Payer: 59 | Source: Ambulatory Visit | Attending: Cardiology | Admitting: Cardiology

## 2022-11-12 DIAGNOSIS — Z48812 Encounter for surgical aftercare following surgery on the circulatory system: Secondary | ICD-10-CM | POA: Diagnosis not present

## 2022-11-12 DIAGNOSIS — Z941 Heart transplant status: Secondary | ICD-10-CM

## 2022-11-14 ENCOUNTER — Encounter (HOSPITAL_COMMUNITY)
Admission: RE | Admit: 2022-11-14 | Discharge: 2022-11-14 | Disposition: A | Discharge status: Home / Self Care | Payer: 59 | Source: Ambulatory Visit | Attending: Cardiology | Admitting: Cardiology

## 2022-11-14 DIAGNOSIS — Z48812 Encounter for surgical aftercare following surgery on the circulatory system: Secondary | ICD-10-CM | POA: Diagnosis not present

## 2022-11-14 DIAGNOSIS — Z941 Heart transplant status: Secondary | ICD-10-CM

## 2022-11-17 ENCOUNTER — Encounter (HOSPITAL_COMMUNITY)
Admission: RE | Admit: 2022-11-17 | Discharge: 2022-11-17 | Disposition: A | Discharge status: Home / Self Care | Payer: 59 | Source: Ambulatory Visit | Attending: Cardiology | Admitting: Cardiology

## 2022-11-17 DIAGNOSIS — Z48812 Encounter for surgical aftercare following surgery on the circulatory system: Secondary | ICD-10-CM | POA: Diagnosis not present

## 2022-11-17 DIAGNOSIS — Z941 Heart transplant status: Secondary | ICD-10-CM

## 2022-11-19 ENCOUNTER — Encounter (HOSPITAL_COMMUNITY)
Admission: RE | Admit: 2022-11-19 | Discharge: 2022-11-19 | Disposition: A | Discharge status: Home / Self Care | Payer: 59 | Source: Ambulatory Visit | Attending: Cardiology | Admitting: Cardiology

## 2022-11-19 DIAGNOSIS — Z941 Heart transplant status: Secondary | ICD-10-CM

## 2022-11-19 NOTE — Progress Notes (Signed)
Cardiac Individual Treatment Plan  Patient Details  Name: Thomas Wood MRN: [id number] Date of Birth: 06-Aug-1996 Referring Provider:   Flowsheet Row INTENSIVE CARDIAC REHAB ORIENT from 09/25/2022 in [facility] for Heart, Vascular, & Lung Health  Referring Provider Dr Melvyn Novas MD/ Dr Fransico Him MD, [place]       Initial Encounter Date:  [name] from 09/25/2022 in [facility] for Heart, Vascular, & [place]  Date 09/25/22       Visit Diagnosis: 07/18/22 S/P orthotopic heart transplant at [place]  Patient's Home Medications on Admission:  Current Outpatient Medications:    acetaminophen (TYLENOL) 325 MG tablet, Take 325 mg by mouth every 6 (six) hours as needed for moderate pain., Disp: , Rfl:    albuterol (VENTOLIN HFA) 108 (90 Base) MCG/ACT inhaler, Inhale 2 puffs into the lungs every 6 (six) hours as needed for wheezing or shortness of breath., Disp: , Rfl:    amLODipine (NORVASC) 5 MG tablet, Take 5 mg by mouth 2 (two) times daily., Disp: , Rfl:    aspirin EC 81 MG tablet, Take 81 mg by mouth once., Disp: , Rfl:    Calcium Carb-Cholecalciferol 600-10 MG-MCG TABS, Take 600 mg by mouth in the morning and at bedtime. With food, Disp: , Rfl:    cetirizine (ZYRTEC) 10 MG tablet, Take 10 mg by mouth daily as needed (for seasonal allergies)., Disp: , Rfl:    magnesium oxide (MAG-OX) 400 MG tablet, Take 400 mg by mouth 2 (two) times daily. With lunch and dinner, Disp: , Rfl:    mycophenolate (CELLCEPT) 250 MG capsule, Take 500 mg by mouth every 12 (twelve) hours., Disp: , Rfl:    nystatin (MYCOSTATIN) 100000 UNIT/ML suspension, Take 5 mLs by mouth 4 (four) times daily., Disp: , Rfl:    pantoprazole (PROTONIX) 40 MG tablet, Take 40 mg by mouth daily., Disp: , Rfl:    pravastatin (PRAVACHOL) 40 MG tablet, Take 1 tablet by mouth at bedtime., Disp: , Rfl:    predniSONE (DELTASONE) 5 MG  tablet, Take 2 tablets by mouth daily., Disp: , Rfl:    sertraline (ZOLOFT) 50 MG tablet, Take 50 mg by mouth daily., Disp: , Rfl:    sulfamethoxazole-trimethoprim (BACTRIM) 400-80 MG tablet, Take 1 tablet by mouth daily., Disp: , Rfl:    tacrolimus (PROGRAF) 1 MG capsule, Take 4 capsules by mouth 2 (two) times daily. 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   Chronic kidney disease    stones   GERD (gastroesophageal reflux disease)    Hyperlipidemia    Hypertension    New onset of congestive heart failure ([place]) 07/11/2021    Tobacco Use: Social History   Tobacco Use  Smoking Status Former   Types: Cigarettes   Quit date: 2018   Years since quitting: 6.2  Smokeless Tobacco Never    Labs: Review Flowsheet  More data exists      Latest Ref Rng & Units 07/13/2021 07/14/2021 07/15/2021 07/16/2021 07/24/2021  Labs for ITP Cardiac and Pulmonary Rehab  Cholestrol 0 - 200 mg/dL - - - - 159   LDL (  calc) 0 - 99 mg/dL - - - - 114   HDL-C >40 mg/dL - - - - 35   Trlycerides <150 mg/dL - - - - 50   Hemoglobin A1c 4.8 - 5.6 % - 5.3  - - 4.9   TCO2 22 - 32 mmol/L - - - - 21   O2 Saturation % 56.7  66.3  64.2  58.4  -    Capillary Blood Glucose: Lab Results  Component Value Date   GLUCAP 71 07/25/2021     Exercise Target Goals: Exercise Program Goal: Individual exercise prescription set using results from initial 6 min walk test and THRR while considering  patient's activity barriers and safety.   Exercise Prescription Goal: Initial exercise prescription builds to 30-45 minutes a day of aerobic activity, 2-3 days per week.  Home exercise guidelines will be given to patient during program as part of  exercise prescription that the participant will acknowledge.  Activity Barriers & Risk Stratification:  Activity Barriers & Cardiac Risk Stratification - 09/25/22 1220       Activity Barriers & Cardiac Risk Stratification   Activity Barriers None    Cardiac Risk Stratification High   < 5MES on 6MWT            6 Minute Walk:  6 Minute Walk     Row Name 09/25/22 1219         6 Minute Walk   Phase Initial     Distance 1145 feet     Walk Time 6 minutes     # of Rest Breaks 0     MPH 2.17     METS 3.79     RPE 11     Perceived Dyspnea  0     VO2 Peak 13.27     Symptoms No     Resting HR 88 bpm     Resting BP 104/82     Resting Oxygen Saturation  98 %     Exercise Oxygen Saturation  during 6 min walk 98 %     Max Ex. HR 105 bpm     Max Ex. BP 111/71     2 Minute Post BP 117/81              Oxygen Initial Assessment:   Oxygen Re-Evaluation:   Oxygen Discharge (Final Oxygen Re-Evaluation):   Initial Exercise Prescription:  Initial Exercise Prescription - 09/25/22 1200       Date of Initial Exercise RX and Referring Provider   Date 09/25/22    Referring Provider Dr Melvyn Novas MD/ Dr Fransico Him MD, [place]    Expected Discharge Date 11/24/22      Bike   Level 2    Watts 70    Minutes 15    METs 3      Recumbant Elliptical   Level 2    RPM 65    Minutes 15    METs 3      Prescription Details   Frequency (times per week) 3    Duration Progress to 30 minutes of continuous aerobic without signs/symptoms of physical distress      Intensity   THRR 40-80% of Max Heartrate 78-156    Ratings of Perceived Exertion 11-13    Perceived Dyspnea 0-4      Progression   Progression Continue to progress workloads to maintain intensity without signs/symptoms of physical distress.      Resistance Training   Training Prescription Yes  Weight 4    Reps 10-15             Perform Capillary Blood Glucose checks as needed.  Exercise  Prescription Changes:   Exercise Prescription Changes     Row Name 09/29/22 1400 10/17/22 1500 10/24/22 1500 11/07/22 1400 11/14/22 1500     Response to Exercise   Blood Pressure (Admit) 118/60 104/80 108/70 120/86 124/70   Blood Pressure (Exercise) 134/68 132/80 134/70 128/64 140/68   Blood Pressure (Exit) 112/84 122/76 98/68 110/72 104/70   Heart Rate (Admit) 92 bpm 100 bpm 99 bpm 99 bpm 96 bpm   Heart Rate (Exercise) 119 bpm 138 bpm 143 bpm 128 bpm 132 bpm   Heart Rate (Exit) 112 bpm 109 bpm 110 bpm 110 bpm 112 bpm   Rating of Perceived Exertion (Exercise) 10 11 11 11 11    Symptoms None None None None None   Comments Pt's first day in the CRP2 program- Reviewed METs Reviewed goals Reviewed METs Reviewed Goals/Mets   Duration Continue with 30 min of aerobic exercise without signs/symptoms of physical distress. Continue with 30 min of aerobic exercise without signs/symptoms of physical distress. Continue with 30 min of aerobic exercise without signs/symptoms of physical distress. Continue with 30 min of aerobic exercise without signs/symptoms of physical distress. Continue with 30 min of aerobic exercise without signs/symptoms of physical distress.   Intensity THRR unchanged THRR unchanged THRR unchanged THRR unchanged THRR unchanged     Progression   Progression Continue to progress workloads to maintain intensity without signs/symptoms of physical distress. Continue to progress workloads to maintain intensity without signs/symptoms of physical distress. Continue to progress workloads to maintain intensity without signs/symptoms of physical distress. Continue to progress workloads to maintain intensity without signs/symptoms of physical distress. Continue to progress workloads to maintain intensity without signs/symptoms of physical distress.   Average METs 3.15 4.5 4.65 4.75 4.95     Resistance Training   Training Prescription Yes Yes Yes Yes Yes   Weight 4 5 lbs 5 lbs 5 lbs 5 lbs   Reps  10-15 10-15 10-15 10-15 10-15   Time 10 Minutes 10 Minutes 10 Minutes 10 Minutes 10 Minutes     Interval Training   Interval Training No No No No No     Bike   Level 2 4 4 4 4    Watts 40 64 70 -- --   Minutes 15 15 15 15 15    METs 3.7 5.1 5.3 5.2 5.6     Recumbant Elliptical   Level 2 5 5 6 6    RPM 50 70 -- 75 --   Watts 70 107 109 118 118   Minutes 15 15 15 15 15    METs 2.6 3.9 4 4.3 4.3            Exercise Comments:   Exercise Comments     Row Name 09/29/22 1441 10/17/22 1522 10/24/22 1526 11/14/22 1500     Exercise Comments Pt's first day in the CRP2 program. Pt felt workloads were too light, we will adjust workloads for next session. Reviewed METs. Pt is making excellent progress with no complaints. Reviewed goals with patient today. Pt is progressing Reviewed METs and goals. Pt is making good progress on his MET level. Will continue to encourage increases in workloads as patient feels comfortable tp do so.             Exercise Goals and Review:   Exercise Goals     Row Name  09/25/22 1322             Exercise Goals   Increase Physical Activity Yes       Intervention Provide advice, education, support and counseling about physical activity/exercise needs.;Develop an individualized exercise prescription for aerobic and resistive training based on initial evaluation findings, risk stratification, comorbidities and participant's personal goals.       Expected Outcomes Long Term: Exercising regularly at least 3-5 days a week.;Short Term: Attend rehab on a regular basis to increase amount of physical activity.;Long Term: Add in home exercise to make exercise part of routine and to increase amount of physical activity.       Increase Strength and Stamina Yes       Intervention Provide advice, education, support and counseling about physical activity/exercise needs.;Develop an individualized exercise prescription for aerobic and resistive training based on initial  evaluation findings, risk stratification, comorbidities and participant's personal goals.       Expected Outcomes Short Term: Increase workloads from initial exercise prescription for resistance, speed, and METs.;Short Term: Perform resistance training exercises routinely during rehab and add in resistance training at home;Long Term: Improve cardiorespiratory fitness, muscular endurance and strength as measured by increased METs and functional capacity (6MWT)       Able to understand and use rate of perceived exertion (RPE) scale Yes       Intervention Provide education and explanation on how to use RPE scale       Expected Outcomes Short Term: Able to use RPE daily in rehab to express subjective intensity level;Long Term:  Able to use RPE to guide intensity level when exercising independently       Knowledge and understanding of Target Heart Rate Range (THRR) Yes       Intervention Provide education and explanation of THRR including how the numbers were predicted and where they are located for reference       Expected Outcomes Short Term: Able to state/look up THRR;Long Term: Able to use THRR to govern intensity when exercising independently;Short Term: Able to use daily as guideline for intensity in rehab       Understanding of Exercise Prescription Yes       Intervention Provide education, explanation, and written materials on patient's individual exercise prescription       Expected Outcomes Short Term: Able to explain program exercise prescription;Long Term: Able to explain home exercise prescription to exercise independently                Exercise Goals Re-Evaluation :  Exercise Goals Re-Evaluation     Row Name 09/29/22 1440 10/24/22 1524 11/14/22 1500         Exercise Goal Re-Evaluation   Exercise Goals Review Increase Physical Activity;Increase Strength and Stamina;Able to understand and use rate of perceived exertion (RPE) scale;Knowledge and understanding of Target Heart Rate  Range (THRR);Understanding of Exercise Prescription Increase Physical Activity;Increase Strength and Stamina;Able to understand and use rate of perceived exertion (RPE) scale;Knowledge and understanding of Target Heart Rate Range (THRR);Understanding of Exercise Prescription Increase Physical Activity;Increase Strength and Stamina;Able to understand and use rate of perceived exertion (RPE) scale;Knowledge and understanding of Target Heart Rate Range (THRR);Understanding of Exercise Prescription     Comments Pt's first day in the CRP2 program. Pt understnads the exercise RX, RPE scale and THRR. Reviewed goals with patient. Pt voices that his stamina has improved since beginning the program. Peak METs are 5.7. Weight loss is a goal, but has lost 1 kg. Reviewed  METs and goals. Pt voices he is achieving his goal of increased stamina. Pt voices feeling stonger. Pt halos voices that he has gained the confidence to exercise alone,     Expected Outcomes Will continue to montior patient and progress exercise workloads as tolerated. Will continue to montior patient and progress exercise workloads as tolerated. Will continue to montior patient and progress exercise workloads as tolerated.              Discharge Exercise Prescription (Final Exercise Prescription Changes):  Exercise Prescription Changes - 11/14/22 1500       Response to Exercise   Blood Pressure (Admit) 124/70    Blood Pressure (Exercise) 140/68    Blood Pressure (Exit) 104/70    Heart Rate (Admit) 96 bpm    Heart Rate (Exercise) 132 bpm    Heart Rate (Exit) 112 bpm    Rating of Perceived Exertion (Exercise) 11    Symptoms None    Comments Reviewed Goals/Mets    Duration Continue with 30 min of aerobic exercise without signs/symptoms of physical distress.    Intensity THRR unchanged      Progression   Progression Continue to progress workloads to maintain intensity without signs/symptoms of physical distress.    Average METs 4.95       Resistance Training   Training Prescription Yes    Weight 5 lbs    Reps 10-15    Time 10 Minutes      Interval Training   Interval Training No      Bike   Level 4    Minutes 15    METs 5.6      Recumbant Elliptical   Level 6    Watts 118    Minutes 15    METs 4.3             Nutrition:  Target Goals: Understanding of nutrition guidelines, daily intake of sodium 1500mg , cholesterol 200mg , calories 30% from fat and 7% or less from saturated fats, daily to have 5 or more servings of fruits and vegetables.  Biometrics:  Pre Biometrics - 09/25/22 1216       Pre Biometrics   Waist Circumference 44.5 inches    Hip Circumference 47.25 inches    Waist to Hip Ratio 0.94 %    Triceps Skinfold 24 mm    % Body Fat 31.8 %    Grip Strength 46 kg    Flexibility 15.75 in    Single Leg Stand 30 seconds              Nutrition Therapy Plan and Nutrition Goals:  Nutrition Therapy & Goals - 11/03/22 1423       Nutrition Therapy   Diet Heart Healthy Diet      Personal Nutrition Goals   Nutrition Goal Patient to identify strategies for reducing cardiovascular risk by attending the [employer] education and nutrition series weekly.    Personal Goal #2 Patient to improve diet quality by using the plate method as a daily guide for meal planning to including lean protein/plant protein, fruits, vegetables, whole gains and nonfat dairy.    Personal Goal #3 Patient to identify strategies for weight loss of 0.5-2.0#.    Comments Goals in action. Thomas Wood reports that he has drastically reduced fried foods. He is cooking more at home. He has not met weight loss goals but has maintained his weight since starting with our program. He continues mindfulness of sodium intake, saturated fat intake, and fiber intake. His  wife was recently diagnosed a multiple food allergies that they are navigating. He will benefit from participation in intensive cardiac rehab for nutrition, exercise and lifestyle  modification.      Intervention Plan   Intervention Prescribe, educate and counsel regarding individualized specific dietary modifications aiming towards targeted core components such as weight, hypertension, lipid management, diabetes, heart failure and other comorbidities.;Nutrition handout(s) given to patient.    Expected Outcomes Short Term Goal: Understand basic principles of dietary content, such as calories, fat, sodium, cholesterol and nutrients.;Long Term Goal: Adherence to prescribed nutrition plan.             Nutrition Assessments:  Nutrition Assessments - 09/25/22 1215       Rate Your Plate Scores   Pre Score 57            MEDIFICTS Score Key: ?70 Need to make dietary changes  40-70 Heart Healthy Diet ? 40 Therapeutic Level Cholesterol Diet   Flowsheet Row INTENSIVE CARDIAC REHAB ORIENT from 09/25/2022 in [facility] for Heart, Vascular, & Lung Health  Picture Your Plate Total Score on Admission 57      Picture Your Plate Scores: <35 Unhealthy dietary pattern with much room for improvement. 41-50 Dietary pattern unlikely to meet recommendations for good health and room for improvement. 51-60 More healthful dietary pattern, with some room for improvement.  >60 Healthy dietary pattern, although there may be some specific behaviors that could be improved.    Nutrition Goals Re-Evaluation:  Nutrition Goals Re-Evaluation     [place] Name 09/29/22 1510 11/03/22 1423           Goals   Current Weight 234 lb 5.6 oz (106.3 kg) 232 lb 2.3 oz (105.3 kg)      Expected Outcome Thomas Wood reports motivation to lose weight since gaining weight on prednisone post-operatively. He acknowledges over-snacking. He will benefit from participation in intensive cardiac rehab for nutrition, exercise and lifestyle modification. Goals in action. Thomas Wood reports that he has drastically reduced fried foods. He is cooking more at home. He has not met weight loss goals but  has maintained his weight since starting with our program. He continues mindfulness of sodium intake, saturated fat intake, and fiber intake. His wife was recently diagnosed a multiple food allergies that they are navigating. He will benefit from participation in intensive cardiac rehab for nutrition, exercise and lifestyle modification.               Nutrition Goals Re-Evaluation:  Nutrition Goals Re-Evaluation     [place] Name 09/29/22 1510 11/03/22 1423           Goals   Current Weight 234 lb 5.6 oz (106.3 kg) 232 lb 2.3 oz (105.3 kg)      Expected Outcome Thomas Wood reports motivation to lose weight since gaining weight on prednisone post-operatively. He acknowledges over-snacking. He will benefit from participation in intensive cardiac rehab for nutrition, exercise and lifestyle modification. Goals in action. Thomas Wood reports that he has drastically reduced fried foods. He is cooking more at home. He has not met weight loss goals but has maintained his weight since starting with our program. He continues mindfulness of sodium intake, saturated fat intake, and fiber intake. His wife was recently diagnosed a multiple food allergies that they are navigating. He will benefit from participation in intensive cardiac rehab for nutrition, exercise and lifestyle modification.               Nutrition Goals Discharge (Final  Nutrition Goals Re-Evaluation):  Nutrition Goals Re-Evaluation - 11/03/22 1423       Goals   Current Weight 232 lb 2.3 oz (105.3 kg)    Expected Outcome Goals in action. Thomas Wood reports that he has drastically reduced fried foods. He is cooking more at home. He has not met weight loss goals but has maintained his weight since starting with our program. He continues mindfulness of sodium intake, saturated fat intake, and fiber intake. His wife was recently diagnosed a multiple food allergies that they are navigating. He will benefit from participation in intensive cardiac rehab for  nutrition, exercise and lifestyle modification.             Psychosocial: Target Goals: Acknowledge presence or absence of significant depression and/or stress, maximize coping skills, provide positive support system. Participant is able to verbalize types and ability to use techniques and skills needed for reducing stress and depression.  Initial Review & Psychosocial Screening:  Initial Psych Review & Screening - 09/25/22 1323       Initial Review   Current issues with None Identified      Family Dynamics   Good Support System? Yes   Thomas Wood has his wife, and Mom for support     Barriers   Psychosocial barriers to participate in program There are no identifiable barriers or psychosocial needs.;The patient should benefit from training in stress management and relaxation.      Screening Interventions   Interventions Encouraged to exercise             Quality of Life Scores:  Quality of Life - 09/25/22 1224       Quality of Life   Select Quality of Life      Quality of Life Scores   Health/Function Pre 26.8 %    Socioeconomic Pre 20.25 %    Psych/Spiritual Pre 24.86 %    Family Pre 26.4 %    GLOBAL Pre 24.86 %            Scores of 19 and below usually indicate a poorer quality of life in these areas.  A difference of  2-3 points is a clinically meaningful difference.  A difference of 2-3 points in the total score of the Quality of Life Index has been associated with significant improvement in overall quality of life, self-image, physical symptoms, and general health in studies assessing change in quality of life.  PHQ-9: Review Flowsheet       09/25/2022 08/12/2021 09/18/2017 08/11/2016  Depression screen PHQ 2/9  Decreased Interest 0 0 0 0  Down, Depressed, Hopeless 0 0 0 0  PHQ - 2 Score 0 0 0 0  Altered sleeping 0 - - -  Tired, decreased energy 0 - - -  Change in appetite 0 - - -  Feeling bad or failure about yourself  0 - - -  Trouble concentrating 0 -  - -  Moving slowly or fidgety/restless 0 - - -  Suicidal thoughts 0 - - -  PHQ-9 Score 0 - - -   Interpretation of Total Score  Total Score Depression Severity:  1-4 = Minimal depression, 5-9 = Mild depression, 10-14 = Moderate depression, 15-19 = Moderately severe depression, 20-27 = Severe depression   Psychosocial Evaluation and Intervention:   Psychosocial Re-Evaluation:  Psychosocial Re-Evaluation     Row Name 09/29/22 1738 10/28/22 0929 11/19/22 1655         Psychosocial Re-Evaluation   Current issues with None Identified None  Identified None Identified     Comments -- No psychosocial need identified at this time No psychosocial need identified at this time     Interventions Encouraged to attend Cardiac Rehabilitation for the exercise Encouraged to attend Cardiac Rehabilitation for the exercise Encouraged to attend Cardiac Rehabilitation for the exercise     Continue Psychosocial Services  No Follow up required No Follow up required No Follow up required              Psychosocial Discharge (Final Psychosocial Re-Evaluation):  Psychosocial Re-Evaluation - 11/19/22 1655       Psychosocial Re-Evaluation   Current issues with None Identified    Comments No psychosocial need identified at this time    Interventions Encouraged to attend Cardiac Rehabilitation for the exercise    Continue Psychosocial Services  No Follow up required             Vocational Rehabilitation: Provide vocational rehab assistance to qualifying candidates.   Vocational Rehab Evaluation & Intervention:  Vocational Rehab - 09/25/22 1324       Initial Vocational Rehab Evaluation & Intervention   Assessment shows need for Vocational Rehabilitation No   Thomas Wood is currently not working due to his recent heart transplant surgery            Education: Education Goals: Education classes will be provided on a weekly basis, covering required topics. Participant will state  understanding/return demonstration of topics presented.    Education     Row Name 09/29/22 1600     Education   Cardiac Education Topics [place]   Environmental consultant Exercise   Exercise Workshop Exercise Basics: Building Your Action Plan   Instruction Review Code 1- Verbalizes Understanding   Class Start Time 1359   Class Stop Time 1445   Class Time Calculation (min) 46 min    [place] Name 10/01/22 1600     Education   Cardiac Education Topics [employer]   Financial trader   Weekly Topic Efficiency Cooking - Meals in a Snap   Instruction Review Code 1- Verbalizes Understanding   Class Start Time [id number]   Class Stop Time 1437   Class Time Calculation (min) 42 min    [place] Name 10/03/22 1500     Education   Cardiac Education Topics [employer]   Academic librarian Exercise Education   Exercise Education Move It!   Instruction Review Code 1- Verbalizes Understanding   Class Start Time 1405   Class Stop Time 1442   Class Time Calculation (min) 37 min    Row Name 10/06/22 1629     Education   Cardiac Education Topics [employer]     Core Videos   Educator Dietitian   Select Nutrition   Instruction Review Code 1- Verbalizes Understanding   Class Start Time 1400   Class Stop Time 1447   Class Time Calculation (min) 47 min    [place] Name 10/10/22 1600     Education   Cardiac Education Topics Pritikin   Architect Education   General Education Hypertension and Heart Disease   Instruction Review Code 1- Verbalizes Understanding   Class Start Time 1410   Class Stop Time 1450  Class Time Calculation (min) 40 min    [place] Name 10/13/22 1500     Education   Cardiac Education Topics Pritikin   Copy Psychosocial   Psychosocial Workshop Healthy Sleep for a Healthy Heart   Instruction Review Code 1- Verbalizes Understanding   Class Start Time 1400   Class Stop Time 1450   Class Time Calculation (min) 50 min    [place] Name 10/15/22 1500     Education   Cardiac Education Topics Pritikin   Financial trader   Weekly Topic Comforting Weekend Breakfasts   Instruction Review Code 1- Verbalizes Understanding   Class Start Time 1400   Class Stop Time 1445   Class Time Calculation (min) 45 min    [place] Name 10/17/22 1500     Education   Cardiac Education Topics Pritikin   Lexicographer Nutrition   Nutrition Dining Out - Part 1   Instruction Review Code 1- Verbalizes Understanding   Class Start Time 1405   Class Stop Time 1440   Class Time Calculation (min) 35 min            Core Videos: Exercise    Move It!  Clinical staff conducted group or individual video education with verbal and written material and guidebook.  Patient learns the recommended Pritikin exercise program. Exercise with the goal of living a long, healthy life. Some of the health benefits of exercise include controlled diabetes, healthier blood pressure levels, improved cholesterol levels, improved heart and lung capacity, improved sleep, and better body composition. Everyone should speak with their doctor before starting or changing an exercise routine.  Biomechanical Limitations Clinical staff conducted group or individual video education with verbal and written material and guidebook.  Patient learns how biomechanical limitations can impact exercise and how we can mitigate and possibly overcome limitations to have an impactful and balanced exercise routine.  Body Composition Clinical staff conducted group or individual video education with verbal and written material and guidebook.   Patient learns that body composition (ratio of muscle mass to fat mass) is a key component to assessing overall fitness, rather than body weight alone. Increased fat mass, especially visceral belly fat, can put [place] at increased risk for metabolic syndrome, type 2 diabetes, heart disease, and even death. It is recommended to combine diet and exercise (cardiovascular and resistance training) to improve your body composition. Seek guidance from your physician and exercise physiologist before implementing an exercise routine.  Exercise Action Plan Clinical staff conducted group or individual video education with verbal and written material and guidebook.  Patient learns the recommended strategies to achieve and enjoy long-term exercise adherence, including variety, self-motivation, self-efficacy, and positive decision making. Benefits of exercise include fitness, good health, weight management, more energy, better sleep, less stress, and overall well-being.  Medical   Heart Disease Risk Reduction Clinical staff conducted group or individual video education with verbal and written material and guidebook.  Patient learns our heart is our most vital organ as it circulates oxygen, nutrients, white blood cells, and hormones throughout the entire body, and carries waste away. Data supports a plant-based eating plan like the Pritikin Program for its effectiveness in slowing progression of and reversing heart disease. The video provides a number of recommendations to address heart disease.  Metabolic Syndrome and Belly Fat  Clinical staff conducted group or individual video education with verbal and written material and guidebook.  Patient learns what metabolic syndrome is, how it leads to heart disease, and how one can reverse it and keep it from coming back. You have metabolic syndrome if you have 3 of the following 5 criteria: abdominal obesity, high blood pressure, high triglycerides, low HDL cholesterol, and  high blood sugar.  Hypertension and Heart Disease Clinical staff conducted group or individual video education with verbal and written material and guidebook.  Patient learns that high blood pressure, or hypertension, is very common in the [place]. Hypertension is largely due to excessive salt intake, but other important risk factors include being overweight, physical inactivity, drinking too much alcohol, smoking, and not eating enough potassium from fruits and vegetables. High blood pressure is a leading risk factor for heart attack, stroke, congestive heart failure, dementia, kidney failure, and premature death. Long-term effects of excessive salt intake include stiffening of the arteries and thickening of heart muscle and organ damage. Recommendations include ways to reduce hypertension and the risk of heart disease.  Diseases of Our Time - Focusing on Diabetes Clinical staff conducted group or individual video education with verbal and written material and guidebook.  Patient learns why the best way to stop diseases of our time is prevention, through food and other lifestyle changes. Medicine (such as prescription pills and surgeries) is often only a Band-Aid on the problem, not a long-term solution. Most common diseases of our time include obesity, type 2 diabetes, hypertension, heart disease, and cancer. The Pritikin Program is recommended and has been proven to help reduce, reverse, and/or prevent the damaging effects of metabolic syndrome.  Nutrition   Overview of the Pritikin Eating Plan  Clinical staff conducted group or individual video education with verbal and written material and guidebook.  Patient learns about the [place] for disease risk reduction. The [place] emphasizes a wide variety of unrefined, minimally-processed carbohydrates, like fruits, vegetables, whole grains, and legumes. Go, Caution, and Stop food choices are explained. Plant-based and lean  animal proteins are emphasized. Rationale provided for low sodium intake for blood pressure control, low added sugars for blood sugar stabilization, and low added fats and oils for coronary artery disease risk reduction and weight management.  Calorie Density  Clinical staff conducted group or individual video education with verbal and written material and guidebook.  Patient learns about calorie density and how it impacts the Pritikin Eating Plan. Knowing the characteristics of the food you choose will help you decide whether those foods will lead to weight gain or weight loss, and whether you want to consume more or less of them. Weight loss is usually a side effect of the Pritikin Eating Plan because of its focus on low calorie-dense foods.  Label Reading  Clinical staff conducted group or individual video education with verbal and written material and guidebook.  Patient learns about the Pritikin recommended label reading guidelines and corresponding recommendations regarding calorie density, added sugars, sodium content, and whole grains.  Dining Out - Part 1  Clinical staff conducted group or individual video education with verbal and written material and guidebook.  Patient learns that restaurant meals can be sabotaging because they can be so high in calories, fat, sodium, and/or sugar. Patient learns recommended strategies on how to positively address this and avoid unhealthy pitfalls.  Facts on Fats  Clinical staff conducted group or individual video education with  verbal and written material and guidebook.  Patient learns that lifestyle modifications can be just as effective, if not more so, as many medications for lowering your risk of heart disease. A Pritikin lifestyle can help to reduce your risk of inflammation and atherosclerosis (cholesterol build-up, or plaque, in the artery walls). Lifestyle interventions such as dietary choices and physical activity address the cause of  atherosclerosis. A review of the types of fats and their impact on blood cholesterol levels, along with dietary recommendations to reduce fat intake is also included.  Nutrition Action Plan  Clinical staff conducted group or individual video education with verbal and written material and guidebook.  Patient learns how to incorporate Pritikin recommendations into their lifestyle. Recommendations include planning and keeping personal health goals in mind as an important part of their success.  Healthy Mind-Set    Healthy Minds, Bodies, Hearts  Clinical staff conducted group or individual video education with verbal and written material and guidebook.  Patient learns how to identify when they are stressed. Video will discuss the impact of that stress, as well as the many benefits of stress management. Patient will also be introduced to stress management techniques. The way we think, act, and feel has an impact on our hearts.  How Our Thoughts Can Heal Our Hearts  Clinical staff conducted group or individual video education with verbal and written material and guidebook.  Patient learns that negative thoughts can cause depression and anxiety. This can result in negative lifestyle behavior and serious health problems. Cognitive behavioral therapy is an effective method to help control our thoughts in order to change and improve our emotional outlook.  Additional Videos:  Exercise    Improving Performance  Clinical staff conducted group or individual video education with verbal and written material and guidebook.  Patient learns to use a non-linear approach by alternating intensity levels and lengths of time spent exercising to help burn more calories and lose more body fat. Cardiovascular exercise helps improve heart health, metabolism, hormonal balance, blood sugar control, and recovery from fatigue. Resistance training improves strength, endurance, balance, coordination, reaction time, metabolism,  and muscle mass. Flexibility exercise improves circulation, posture, and balance. Seek guidance from your physician and exercise physiologist before implementing an exercise routine and learn your capabilities and proper form for all exercise.  Introduction to Yoga  Clinical staff conducted group or individual video education with verbal and written material and guidebook.  Patient learns about yoga, a discipline of the coming together of mind, breath, and body. The benefits of yoga include improved flexibility, improved range of motion, better posture and core strength, increased lung function, weight loss, and positive self-image. Yoga's heart health benefits include lowered blood pressure, healthier heart rate, decreased cholesterol and triglyceride levels, improved immune function, and reduced stress. Seek guidance from your physician and exercise physiologist before implementing an exercise routine and learn your capabilities and proper form for all exercise.  Medical   Aging: Enhancing Your Quality of Life  Clinical staff conducted group or individual video education with verbal and written material and guidebook.  Patient learns key strategies and recommendations to stay in good physical health and enhance quality of life, such as prevention strategies, having an advocate, securing a Health Care Proxy and Power of Attorney, and keeping a list of medications and system for tracking them. It also discusses how to avoid risk for bone loss.  Biology of Weight Control  Clinical staff conducted group or individual video education with verbal and written  material and guidebook.  Patient learns that weight gain occurs because we consume more calories than we burn (eating more, moving less). Even if your body weight is normal, you may have higher ratios of fat compared to muscle mass. Too much body fat puts you at increased risk for cardiovascular disease, heart attack, stroke, type 2 diabetes, and  obesity-related cancers. In addition to exercise, following the [place] can help reduce your risk.  Decoding Lab Results  Clinical staff conducted group or individual video education with verbal and written material and guidebook.  Patient learns that lab test reflects one measurement whose values change over time and are influenced by many factors, including medication, stress, sleep, exercise, food, hydration, pre-existing medical conditions, and more. It is recommended to use the knowledge from this video to become more involved with your lab results and evaluate your numbers to speak with your doctor.   Diseases of Our Time - Overview  Clinical staff conducted group or individual video education with verbal and written material and guidebook.  Patient learns that according to the CDC, 50% to 70% of chronic diseases (such as obesity, type 2 diabetes, elevated lipids, hypertension, and heart disease) are avoidable through lifestyle improvements including healthier food choices, listening to satiety cues, and increased physical activity.  Sleep Disorders Clinical staff conducted group or individual video education with verbal and written material and guidebook.  Patient learns how good quality and duration of sleep are important to overall health and well-being. Patient also learns about sleep disorders and how they impact health along with recommendations to address them, including discussing with a physician.  Nutrition  Dining Out - Part 2 Clinical staff conducted group or individual video education with verbal and written material and guidebook.  Patient learns how to plan ahead and communicate in order to maximize their dining experience in a healthy and nutritious manner. Included are recommended food choices based on the type of restaurant the patient is visiting.   Fueling a Best boy conducted group or individual video education with verbal and written  material and guidebook.  There is a strong connection between our food choices and our health. Diseases like obesity and type 2 diabetes are very prevalent and are in large-part due to lifestyle choices. The Pritikin Eating Plan provides plenty of food and hunger-curbing satisfaction. It is easy to follow, affordable, and helps reduce health risks.  Menu Workshop  Clinical staff conducted group or individual video education with verbal and written material and guidebook.  Patient learns that restaurant meals can sabotage health goals because they are often packed with calories, fat, sodium, and sugar. Recommendations include strategies to plan ahead and to communicate with the manager, chef, or server to help order a healthier meal.  Planning Your Eating Strategy  Clinical staff conducted group or individual video education with verbal and written material and guidebook.  Patient learns about the [place] and its benefit of reducing the risk of disease. The [place] does not focus on calories. Instead, it emphasizes high-quality, nutrient-rich foods. By knowing the characteristics of the foods, we choose, we can determine their calorie density and make informed decisions.  Targeting Your Nutrition Priorities  Clinical staff conducted group or individual video education with verbal and written material and guidebook.  Patient learns that lifestyle habits have a tremendous impact on disease risk and progression. This video provides eating and physical activity recommendations based on your personal health goals, such as  reducing LDL cholesterol, losing weight, preventing or controlling type 2 diabetes, and reducing high blood pressure.  Vitamins and Minerals  Clinical staff conducted group or individual video education with verbal and written material and guidebook.  Patient learns different ways to obtain key vitamins and minerals, including through a recommended healthy diet.  It is important to discuss all supplements you take with your doctor.   Healthy Mind-Set    Smoking Cessation  Clinical staff conducted group or individual video education with verbal and written material and guidebook.  Patient learns that cigarette smoking and tobacco addiction pose a serious health risk which affects millions of people. Stopping smoking will significantly reduce the risk of heart disease, lung disease, and many forms of cancer. Recommended strategies for quitting are covered, including working with your doctor to develop a successful plan.  Culinary   Becoming a Set designer conducted group or individual video education with verbal and written material and guidebook.  Patient learns that cooking at home can be healthy, cost-effective, quick, and puts them in control. Keys to cooking healthy recipes will include looking at your recipe, assessing your equipment needs, planning ahead, making it simple, choosing cost-effective seasonal ingredients, and limiting the use of added fats, salts, and sugars.  Cooking - Breakfast and Snacks  Clinical staff conducted group or individual video education with verbal and written material and guidebook.  Patient learns how important breakfast is to satiety and nutrition through the entire day. Recommendations include key foods to eat during breakfast to help stabilize blood sugar levels and to prevent overeating at meals later in the day. Planning ahead is also a key component.  Cooking - Educational psychologist conducted group or individual video education with verbal and written material and guidebook.  Patient learns eating strategies to improve overall health, including an approach to cook more at home. Recommendations include thinking of animal protein as a side on your plate rather than center stage and focusing instead on lower calorie dense options like vegetables, fruits, whole grains, and plant-based  proteins, such as beans. Making sauces in large quantities to freeze for later and leaving the skin on your vegetables are also recommended to maximize your experience.  Cooking - Healthy Salads and Dressing Clinical staff conducted group or individual video education with verbal and written material and guidebook.  Patient learns that vegetables, fruits, whole grains, and legumes are the foundations of the Pritikin Eating Plan. Recommendations include how to incorporate each of these in flavorful and healthy salads, and how to create homemade salad dressings. Proper handling of ingredients is also covered. Cooking - Soups and [employer] - Soups and Desserts Clinical staff conducted group or individual video education with verbal and written material and guidebook.  Patient learns that Pritikin soups and desserts make for easy, nutritious, and delicious snacks and meal components that are low in sodium, fat, sugar, and calorie density, while high in vitamins, minerals, and filling fiber. Recommendations include simple and healthy ideas for soups and desserts.   Overview     The Pritikin Solution Program Overview Clinical staff conducted group or individual video education with verbal and written material and guidebook.  Patient learns that the results of the Pritikin Program have been documented in more than 100 articles published in peer-reviewed journals, and the benefits include reducing risk factors for (and, in some cases, even reversing) high cholesterol, high blood pressure, type 2 diabetes, obesity, and more! An overview of  the three key pillars of the Pritikin Program will be covered: eating well, doing regular exercise, and having a healthy mind-set.  WORKSHOPS  Exercise: Exercise Basics: Building Your Action Plan Clinical staff led group instruction and group discussion with PowerPoint presentation and patient guidebook. To enhance the learning environment the use of posters,  models and videos may be added. At the conclusion of this workshop, patients will comprehend the difference between physical activity and exercise, as well as the benefits of incorporating both, into their routine. Patients will understand the FITT (Frequency, Intensity, Time, and Type) principle and how to use it to build an exercise action plan. In addition, safety concerns and other considerations for exercise and cardiac rehab will be addressed by the presenter. The purpose of this lesson is to promote a comprehensive and effective weekly exercise routine in order to improve patients' overall level of fitness.   Managing Heart Disease: Your Path to a Healthier Heart Clinical staff led group instruction and group discussion with PowerPoint presentation and patient guidebook. To enhance the learning environment the use of posters, models and videos may be added.At the conclusion of this workshop, patients will understand the anatomy and physiology of the heart. Additionally, they will understand how Pritikin's three pillars impact the risk factors, the progression, and the management of heart disease.  The purpose of this lesson is to provide a high-level overview of the heart, heart disease, and how the Pritikin lifestyle positively impacts risk factors.  Exercise Biomechanics Clinical staff led group instruction and group discussion with PowerPoint presentation and patient guidebook. To enhance the learning environment the use of posters, models and videos may be added. Patients will learn how the structural parts of their bodies function and how these functions impact their daily activities, movement, and exercise. Patients will learn how to promote a neutral spine, learn how to manage pain, and identify ways to improve their physical movement in order to promote healthy living. The purpose of this lesson is to expose patients to common physical limitations that impact physical activity.  Participants will learn practical ways to adapt and manage aches and pains, and to minimize their effect on regular exercise. Patients will learn how to maintain good posture while sitting, walking, and lifting.  Balance Training and Fall Prevention  Clinical staff led group instruction and group discussion with PowerPoint presentation and patient guidebook. To enhance the learning environment the use of posters, models and videos may be added. At the conclusion of this workshop, patients will understand the importance of their sensorimotor skills (vision, proprioception, and the vestibular system) in maintaining their ability to balance as they age. Patients will apply a variety of balancing exercises that are appropriate for their current level of function. Patients will understand the common causes for poor balance, possible solutions to these problems, and ways to modify their physical environment in order to minimize their fall risk. The purpose of this lesson is to teach patients about the importance of maintaining balance as they age and ways to minimize their risk of falling.  WORKSHOPS   Nutrition:  Fueling a Ship broker led group instruction and group discussion with PowerPoint presentation and patient guidebook. To enhance the learning environment the use of posters, models and videos may be added. Patients will review the foundational principles of the Pritikin Eating Plan and understand what constitutes a serving size in each of the food groups. Patients will also learn Pritikin-friendly foods that are better choices when away from home  and review make-ahead meal and snack options. Calorie density will be reviewed and applied to three nutrition priorities: weight maintenance, weight loss, and weight gain. The purpose of this lesson is to reinforce (in a group setting) the key concepts around what patients are recommended to eat and how to apply these guidelines when away  from home by planning and selecting Pritikin-friendly options. Patients will understand how calorie density may be adjusted for different weight management goals.  Mindful Eating  Clinical staff led group instruction and group discussion with PowerPoint presentation and patient guidebook. To enhance the learning environment the use of posters, models and videos may be added. Patients will briefly review the concepts of the [place] and the importance of low-calorie dense foods. The concept of mindful eating will be introduced as well as the importance of paying attention to internal hunger signals. Triggers for non-hunger eating and techniques for dealing with triggers will be explored. The purpose of this lesson is to provide patients with the opportunity to review the basic principles of the [place], discuss the value of eating mindfully and how to measure internal cues of hunger and fullness using the Hunger Scale. Patients will also discuss reasons for non-hunger eating and learn strategies to use for controlling emotional eating.  Targeting Your Nutrition Priorities Clinical staff led group instruction and group discussion with PowerPoint presentation and patient guidebook. To enhance the learning environment the use of posters, models and videos may be added. Patients will learn how to determine their genetic susceptibility to disease by reviewing their family history. Patients will gain insight into the importance of diet as part of an overall healthy lifestyle in mitigating the impact of genetics and other environmental insults. The purpose of this lesson is to provide patients with the opportunity to assess their personal nutrition priorities by looking at their family history, their own health history and current risk factors. Patients will also be able to discuss ways of prioritizing and modifying the [place] for their highest risk areas  Menu  Clinical  staff led group instruction and group discussion with PowerPoint presentation and patient guidebook. To enhance the learning environment the use of posters, models and videos may be added. Using menus brought in from [employer], or printed from [employer], patients will apply the [place] dining out guidelines that were presented in the [employer] video. Patients will also be able to practice these guidelines in a variety of provided scenarios. The purpose of this lesson is to provide patients with the opportunity to practice hands-on learning of the [place] with actual menus and practice scenarios.  Label Reading Clinical staff led group instruction and group discussion with PowerPoint presentation and patient guidebook. To enhance the learning environment the use of posters, models and videos may be added. Patients will review and discuss the Prit[employer] label reading guidelines presented in [employer]'s Label Reading Educational series video. Using fool labels brought in from local grocery stores and markets, patients will apply the label reading guidelines and determine if the packaged food meet the Pritikin guidelines. The purpose of this lesson is to provide patients with the opportunity to review, discuss, and practice hands-on learning of the [employer] Label Reading guidelines with actual packaged food labels. [place] Workshops are designed to teach patients ways to prepare quick, simple, and affordable recipes at home. The importance of nutrition's role in chronic disease risk reduction is reflected in its  emphasis in the overall Pritikin program. By learning how to prepare essential core Pritikin Eating Plan recipes, patients will increase control over what they eat; be able to customize the flavor of foods without the use of added salt, sugar, or fat; and improve the quality of the food they consume. By learning a set of  core recipes which are easily assembled, quickly prepared, and affordable, patients are more likely to prepare more healthy foods at home. These workshops focus on convenient breakfasts, simple entres, side dishes, and desserts which can be prepared with minimal effort and are consistent with nutrition recommendations for cardiovascular risk reduction. Cooking [employer] are taught by a Engineer, materials (RD) who has been trained by the [employer]. The chef or RD has a clear understanding of the importance of minimizing - if not completely eliminating - added fat, sugar, and sodium in recipes. Throughout the series of [place] Workshop sessions, patients will learn about healthy ingredients and efficient methods of cooking to build confidence in their capability to prepare    Cooking School weekly topics:  Adding Flavor- Sodium-Free  Fast and Healthy Breakfasts  Powerhouse Plant-Based Proteins  Satisfying Salads and Dressings  Simple Sides and Sauces  International Cuisine-Spotlight on the [place] Zones  Delicious Desserts  Savory Soups  [employer] - Meals in a Agricultural consultant Appetizers and Snacks  Comforting Weekend Breakfasts  One-Pot Wonders   Fast Evening Meals  Contractor Your Pritikin Plate  WORKSHOPS   Healthy Mindset (Psychosocial):  Focused Goals, Sustainable Changes Clinical staff led group instruction and group discussion with PowerPoint presentation and patient guidebook. To enhance the learning environment the use of posters, models and videos may be added. Patients will be able to apply effective goal setting strategies to establish at least one personal goal, and then take consistent, meaningful action toward that goal. They will learn to identify common barriers to achieving personal goals and develop strategies to overcome them. Patients will also gain an understanding of how our mind-set can impact our ability  to achieve goals and the importance of cultivating a positive and growth-oriented mind-set. The purpose of this lesson is to provide patients with a deeper understanding of how to set and achieve personal goals, as well as the tools and strategies needed to overcome common obstacles which may arise along the way.  From Head to Heart: The Power of a Healthy Outlook  Clinical staff led group instruction and group discussion with PowerPoint presentation and patient guidebook. To enhance the learning environment the use of posters, models and videos may be added. Patients will be able to recognize and describe the impact of emotions and mood on physical health. They will discover the importance of self-care and explore self-care practices which may work for them. Patients will also learn how to utilize the 4 C's to cultivate a healthier outlook and better manage stress and challenges. The purpose of this lesson is to demonstrate to patients how a healthy outlook is an essential part of maintaining good health, especially as they continue their cardiac rehab journey.  Healthy Sleep for a Healthy Heart Clinical staff led group instruction and group discussion with PowerPoint presentation and patient guidebook. To enhance the learning environment the use of posters, models and videos may be added. At the conclusion of this workshop, patients will be able to demonstrate knowledge of the importance of sleep to overall health, well-being, and quality of life. They will understand the  symptoms of, and treatments for, common sleep disorders. Patients will also be able to identify daytime and nighttime behaviors which impact sleep, and they will be able to apply these tools to help manage sleep-related challenges. The purpose of this lesson is to provide patients with a general overview of sleep and outline the importance of quality sleep. Patients will learn about a few of the most common sleep disorders. Patients will  also be introduced to the concept of "sleep hygiene," and discover ways to self-manage certain sleeping problems through simple daily behavior changes. Finally, the workshop will motivate patients by clarifying the links between quality sleep and their goals of heart-healthy living.   Recognizing and Reducing Stress Clinical staff led group instruction and group discussion with PowerPoint presentation and patient guidebook. To enhance the learning environment the use of posters, models and videos may be added. At the conclusion of this workshop, patients will be able to understand the types of stress reactions, differentiate between acute and chronic stress, and recognize the impact that chronic stress has on their health. They will also be able to apply different coping mechanisms, such as reframing negative self-talk. Patients will have the opportunity to practice a variety of stress management techniques, such as deep abdominal breathing, progressive muscle relaxation, and/or guided imagery.  The purpose of this lesson is to educate patients on the role of stress in their lives and to provide healthy techniques for coping with it.  Learning Barriers/Preferences:   Education Topics:  Knowledge Questionnaire Score:  Knowledge Questionnaire Score - 09/25/22 1225       Knowledge Questionnaire Score   Pre Score 20/24             Core Components/Risk Factors/Patient Goals at Admission:  Personal Goals and Risk Factors at Admission - 09/25/22 1225       Core Components/Risk Factors/Patient Goals on Admission    Weight Management Yes;Obesity;Weight Loss    Intervention Weight Management: Develop a combined nutrition and exercise program designed to reach desired caloric intake, while maintaining appropriate intake of nutrient and fiber, sodium and fats, and appropriate energy expenditure required for the weight goal.;Weight Management: Provide education and appropriate resources to help  participant work on and attain dietary goals.;Weight Management/Obesity: Establish reasonable short term and long term weight goals.;Obesity: Provide education and appropriate resources to help participant work on and attain dietary goals.    Admit Weight 232 lb 5.8 oz (105.4 kg)    Goal Weight: Long Term 180 lb (81.6 kg)    Expected Outcomes Long Term: Adherence to nutrition and physical activity/exercise program aimed toward attainment of established weight goal;Short Term: Continue to assess and modify interventions until short term weight is achieved;Weight Loss: Understanding of general recommendations for a balanced deficit meal plan, which promotes 1-2 lb weight loss per week and includes a negative energy balance of [phone] kcal/d;Understanding of distribution of calorie intake throughout the day with the consumption of 4-5 meals/snacks;Understanding recommendations for meals to include 15-35% energy as protein, 25-35% energy from fat, 35-60% energy from carbohydrates, less than 200mg  of dietary cholesterol, 20-35 gm of total fiber daily    Hypertension Yes    Intervention Provide education on lifestyle modifcations including regular physical activity/exercise, weight management, moderate sodium restriction and increased consumption of fresh fruit, vegetables, and low fat dairy, alcohol moderation, and smoking cessation.;Monitor prescription use compliance.    Expected Outcomes Short Term: Continued assessment and intervention until BP is < 140/43mm HG in hypertensive participants. < 130/69mm HG  in hypertensive participants with diabetes, heart failure or chronic kidney disease.;Long Term: Maintenance of blood pressure at goal levels.    Lipids Yes    Intervention Provide education and support for participant on nutrition & aerobic/resistive exercise along with prescribed medications to achieve LDL 70mg , HDL >40mg .    Expected Outcomes Short Term: Participant states understanding of desired  cholesterol values and is compliant with medications prescribed. Participant is following exercise prescription and nutrition guidelines.;Long Term: Cholesterol controlled with medications as prescribed, with individualized exercise RX and with personalized nutrition plan. Value goals: LDL < 70mg , HDL > 40 mg.             Core Components/Risk Factors/Patient Goals Review:   Goals and Risk Factor Review     Row Name 09/29/22 1738 10/28/22 0931 11/19/22 1658         Core Components/Risk Factors/Patient Goals Review   Personal Goals Review Weight Management/Obesity;Hypertension;Lipids Weight Management/Obesity;Hypertension;Lipids Weight Management/Obesity;Hypertension;Lipids     Review Thomas Wood started intensvie cardiac rehab on 09/29/22 and did well with exercise Thomas Wood is doing well with exercise at [facility] cardiac rehab. Vital signs have been stable. Thomas Wood  continues to  do well with exercise at [facility] cardiac rehab. Vital signs have been stable. Thomas Wood has lost 1.6 kg since starting cardiac rehab     Expected Outcomes Thomas Wood will continue to participate in intesive cardiac rehab for exercise, nutrition and lifestyle modifications Thomas Wood will continue to participate in intesive cardiac rehab for exercise, nutrition and lifestyle modifications Thomas Wood will continue to participate in intesive cardiac rehab for exercise, nutrition and lifestyle modifications              Core Components/Risk Factors/Patient Goals at Discharge (Final Review):   Goals and Risk Factor Review - 11/19/22 1658       Core Components/Risk Factors/Patient Goals Review   Personal Goals Review Weight Management/Obesity;Hypertension;Lipids    Review Thomas Wood  continues to  do well with exercise at [facility] cardiac rehab. Vital signs have been stable. Thomas Wood has lost 1.6 kg since starting cardiac rehab    Expected Outcomes Thomas Wood will continue to participate in intesive cardiac rehab for exercise, nutrition and lifestyle  modifications             ITP Comments:  ITP Comments     Row Name 09/25/22 1215 09/29/22 1737 10/28/22 0928 11/19/22 1654     ITP Comments Dr. Fransico Him medical director. Introduction to pritikin education program/ intensive cardiac rehab. Initital orientation packet reviewed with patient. 30 day ITP Review. Thomas Wood started intensive cardiac rehab on 09/29/22 and did well with exercise. 30 day ITP Review. Thomas Wood has good attendance and participation in intensive cardiac rehab 30 day ITP Review. Thomas Wood continues to  have good attendance and participation in intensive cardiac rehab.             Comments: See ITP comments.Harrell Gave RN BSN

## 2022-11-19 NOTE — Progress Notes (Signed)
Incomplete Session Note  Patient Details  Name: Thomas Wood MRN: [id number] Date of Birth: [birth year] Referring Provider:   Flowsheet Row INTENSIVE CARDIAC REHAB ORIENT from 09/25/2022 in [facility]  Referring Provider Dr Melvyn Novas MD/ Dr Fransico Him MD, Coveriing       [name] did not complete his rehab session.  [name] reported passing a kidney stone earlier this week. [name] denies pain today he does report having some intermittent nausea. Blood pressure 116/68 heart rate 102. I advised that [name] not exercise today and to notify the transplant coordinator as he has notified them that he had a kidney stone. [name] plans to return to exercise on Friday if he is feeling better.Harrell Gave RN BSN

## 2022-11-21 ENCOUNTER — Encounter (HOSPITAL_COMMUNITY)
Admission: RE | Admit: 2022-11-21 | Discharge: 2022-11-21 | Disposition: A | Discharge status: Home / Self Care | Payer: 59 | Source: Ambulatory Visit | Attending: Cardiology | Admitting: Cardiology

## 2022-11-21 DIAGNOSIS — Z48812 Encounter for surgical aftercare following surgery on the circulatory system: Secondary | ICD-10-CM | POA: Diagnosis not present

## 2022-11-21 DIAGNOSIS — Z941 Heart transplant status: Secondary | ICD-10-CM

## 2022-11-24 ENCOUNTER — Ambulatory Visit (HOSPITAL_COMMUNITY): Payer: 59

## 2022-11-26 ENCOUNTER — Ambulatory Visit (HOSPITAL_COMMUNITY): Payer: 59

## 2022-11-28 ENCOUNTER — Telehealth (HOSPITAL_COMMUNITY): Payer: Self-pay

## 2022-11-28 ENCOUNTER — Ambulatory Visit (HOSPITAL_COMMUNITY): Payer: 59

## 2022-12-01 ENCOUNTER — Inpatient Hospital Stay (HOSPITAL_COMMUNITY): Admission: RE | Admit: 2022-12-01 | Payer: 59 | Source: Ambulatory Visit

## 2022-12-03 ENCOUNTER — Ambulatory Visit (HOSPITAL_COMMUNITY): Payer: 59

## 2022-12-05 ENCOUNTER — Ambulatory Visit (HOSPITAL_COMMUNITY): Payer: 59

## 2022-12-08 ENCOUNTER — Encounter (HOSPITAL_COMMUNITY)
Admission: RE | Admit: 2022-12-08 | Discharge: 2022-12-08 | Disposition: A | Discharge status: Home / Self Care | Payer: 59 | Source: Ambulatory Visit | Attending: Cardiology | Admitting: Cardiology

## 2022-12-08 DIAGNOSIS — Z941 Heart transplant status: Secondary | ICD-10-CM | POA: Insufficient documentation

## 2022-12-09 NOTE — Progress Notes (Signed)
[name] returned to exercise at cardiac rehab after being absent with a stomach virus and kidney stones.Thayer Headings RN BSN

## 2022-12-10 ENCOUNTER — Encounter (HOSPITAL_COMMUNITY)
Admission: RE | Admit: 2022-12-10 | Discharge: 2022-12-10 | Disposition: A | Discharge status: Home / Self Care | Payer: 59 | Source: Ambulatory Visit | Attending: Cardiology | Admitting: Cardiology

## 2022-12-10 DIAGNOSIS — Z941 Heart transplant status: Secondary | ICD-10-CM | POA: Diagnosis not present

## 2022-12-11 NOTE — Progress Notes (Signed)
Cardiac Individual Treatment Plan  Patient Details  Name: [name] MRN: [id number] Date of Birth: 06-Aug-1996 Referring Provider:   Flowsheet Row INTENSIVE CARDIAC REHAB ORIENT from 09/25/2022 in [facility] for Heart, Vascular, & Lung Health  Referring Provider Dr Melvyn Novas MD/ Dr Fransico Him MD, [place]       Initial Encounter Date:  Bell from 09/25/2022 in [facility] for Heart, Vascular, & [place]  Date 09/25/22       Visit Diagnosis: 07/18/22 S/P orthotopic heart transplant at [place]  Patient's Home Medications on Admission:  Current Outpatient Medications:    acetaminophen (TYLENOL) 325 MG tablet, Take 325 mg by mouth every 6 (six) hours as needed for moderate pain., Disp: , Rfl:    albuterol (VENTOLIN HFA) 108 (90 Base) MCG/ACT inhaler, Inhale 2 puffs into the lungs every 6 (six) hours as needed for wheezing or shortness of breath., Disp: , Rfl:    amLODipine (NORVASC) 5 MG tablet, Take 5 mg by mouth 2 (two) times daily., Disp: , Rfl:    aspirin EC 81 MG tablet, Take 81 mg by mouth once., Disp: , Rfl:    Calcium Carb-Cholecalciferol 600-10 MG-MCG TABS, Take 600 mg by mouth in the morning and at bedtime. With food, Disp: , Rfl:    cetirizine (ZYRTEC) 10 MG tablet, Take 10 mg by mouth daily as needed (for seasonal allergies)., Disp: , Rfl:    magnesium oxide (MAG-OX) 400 MG tablet, Take 400 mg by mouth 2 (two) times daily. With lunch and dinner, Disp: , Rfl:    mycophenolate (CELLCEPT) 250 MG capsule, Take 500 mg by mouth every 12 (twelve) hours., Disp: , Rfl:    nystatin (MYCOSTATIN) 100000 UNIT/ML suspension, Take 5 mLs by mouth 4 (four) times daily., Disp: , Rfl:    pantoprazole (PROTONIX) 40 MG tablet, Take 40 mg by mouth daily., Disp: , Rfl:    pravastatin (PRAVACHOL) 40 MG tablet, Take 1 tablet by mouth at bedtime., Disp: , Rfl:    predniSONE (DELTASONE) 5 MG  tablet, Take 2 tablets by mouth daily., Disp: , Rfl:    sertraline (ZOLOFT) 50 MG tablet, Take 50 mg by mouth daily., Disp: , Rfl:    sulfamethoxazole-trimethoprim (BACTRIM) 400-80 MG tablet, Take 1 tablet by mouth daily., Disp: , Rfl:    tacrolimus (PROGRAF) 1 MG capsule, Take 4 capsules by mouth 2 (two) times daily. 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   Chronic kidney disease    stones   GERD (gastroesophageal reflux disease)    Hyperlipidemia    Hypertension    New onset of congestive heart failure ([place]) 07/11/2021    Tobacco Use: Social History   Tobacco Use  Smoking Status Former   Types: Cigarettes   Quit date: 2018   Years since quitting: 6.2  Smokeless Tobacco Never    Labs: Review Flowsheet  More data exists      Latest Ref Rng & Units 07/13/2021 07/14/2021 07/15/2021 07/16/2021 07/24/2021  Labs for ITP Cardiac and Pulmonary Rehab  Cholestrol 0 - 200 mg/dL - - - - 159   LDL (  calc) 0 - 99 mg/dL - - - - 114   HDL-C >40 mg/dL - - - - 35   Trlycerides <150 mg/dL - - - - 50   Hemoglobin A1c 4.8 - 5.6 % - 5.3  - - 4.9   TCO2 22 - 32 mmol/L - - - - 21   O2 Saturation % 56.7  66.3  64.2  58.4  -    Capillary Blood Glucose: Lab Results  Component Value Date   GLUCAP 71 07/25/2021     Exercise Target Goals: Exercise Program Goal: Individual exercise prescription set using results from initial 6 min walk test and THRR while considering  patient's activity barriers and safety.   Exercise Prescription Goal: Initial exercise prescription builds to 30-45 minutes a day of aerobic activity, 2-3 days per week.  Home exercise guidelines will be given to patient during program as part of  exercise prescription that the participant will acknowledge.  Activity Barriers & Risk Stratification:  Activity Barriers & Cardiac Risk Stratification - 09/25/22 1220       Activity Barriers & Cardiac Risk Stratification   Activity Barriers None    Cardiac Risk Stratification High   < 5MES on 6MWT            6 Minute Walk:  6 Minute Walk     Row Name 09/25/22 1219         6 Minute Walk   Phase Initial     Distance 1145 feet     Walk Time 6 minutes     # of Rest Breaks 0     MPH 2.17     METS 3.79     RPE 11     Perceived Dyspnea  0     VO2 Peak 13.27     Symptoms No     Resting HR 88 bpm     Resting BP 104/82     Resting Oxygen Saturation  98 %     Exercise Oxygen Saturation  during 6 min walk 98 %     Max Ex. HR 105 bpm     Max Ex. BP 111/71     2 Minute Post BP 117/81              Oxygen Initial Assessment:   Oxygen Re-Evaluation:   Oxygen Discharge (Final Oxygen Re-Evaluation):   Initial Exercise Prescription:  Initial Exercise Prescription - 09/25/22 1200       Date of Initial Exercise RX and Referring Provider   Date 09/25/22    Referring Provider Dr Melvyn Novas MD/ Dr Fransico Him MD, [place]    Expected Discharge Date 11/24/22      Bike   Level 2    Watts 70    Minutes 15    METs 3      Recumbant Elliptical   Level 2    RPM 65    Minutes 15    METs 3      Prescription Details   Frequency (times per week) 3    Duration Progress to 30 minutes of continuous aerobic without signs/symptoms of physical distress      Intensity   THRR 40-80% of Max Heartrate 78-156    Ratings of Perceived Exertion 11-13    Perceived Dyspnea 0-4      Progression   Progression Continue to progress workloads to maintain intensity without signs/symptoms of physical distress.      Resistance Training   Training Prescription Yes  Weight 4    Reps 10-15             Perform Capillary Blood Glucose checks as needed.  Exercise  Prescription Changes:   Exercise Prescription Changes     Row Name 09/29/22 1400 10/17/22 1500 10/24/22 1500 11/07/22 1400 11/14/22 1500     Response to Exercise   Blood Pressure (Admit) 118/60 104/80 108/70 120/86 124/70   Blood Pressure (Exercise) 134/68 132/80 134/70 128/64 140/68   Blood Pressure (Exit) 112/84 122/76 98/68 110/72 104/70   Heart Rate (Admit) 92 bpm 100 bpm 99 bpm 99 bpm 96 bpm   Heart Rate (Exercise) 119 bpm 138 bpm 143 bpm 128 bpm 132 bpm   Heart Rate (Exit) 112 bpm 109 bpm 110 bpm 110 bpm 112 bpm   Rating of Perceived Exertion (Exercise) 10 11 11 11 11    Symptoms None None None None None   Comments Pt's first day in the CRP2 program- Reviewed METs Reviewed goals Reviewed METs Reviewed Goals/Mets   Duration Continue with 30 min of aerobic exercise without signs/symptoms of physical distress. Continue with 30 min of aerobic exercise without signs/symptoms of physical distress. Continue with 30 min of aerobic exercise without signs/symptoms of physical distress. Continue with 30 min of aerobic exercise without signs/symptoms of physical distress. Continue with 30 min of aerobic exercise without signs/symptoms of physical distress.   Intensity THRR unchanged THRR unchanged THRR unchanged THRR unchanged THRR unchanged     Progression   Progression Continue to progress workloads to maintain intensity without signs/symptoms of physical distress. Continue to progress workloads to maintain intensity without signs/symptoms of physical distress. Continue to progress workloads to maintain intensity without signs/symptoms of physical distress. Continue to progress workloads to maintain intensity without signs/symptoms of physical distress. Continue to progress workloads to maintain intensity without signs/symptoms of physical distress.   Average METs 3.15 4.5 4.65 4.75 4.95     Resistance Training   Training Prescription Yes Yes Yes Yes Yes   Weight 4 5 lbs 5 lbs 5 lbs 5 lbs   Reps  10-15 10-15 10-15 10-15 10-15   Time 10 Minutes 10 Minutes 10 Minutes 10 Minutes 10 Minutes     Interval Training   Interval Training No No No No No     Bike   Level 2 4 4 4 4    Watts 40 64 70 -- --   Minutes 15 15 15 15 15    METs 3.7 5.1 5.3 5.2 5.6     Recumbant Elliptical   Level 2 5 5 6 6    RPM 50 70 -- 75 --   Watts 70 107 109 118 118   Minutes 15 15 15 15 15    METs 2.6 3.9 4 4.3 4.3    Row Name 12/11/22 0900             Response to Exercise   Blood Pressure (Admit) 110/70       Blood Pressure (Exercise) 128/78       Blood Pressure (Exit) 100/52       Heart Rate (Admit) 98 bpm       Heart Rate (Exercise) 135 bpm       Heart Rate (Exit) 107 bpm       Rating of Perceived Exertion (Exercise) 11       Symptoms None       Comments Reviewed goals and METs       Duration Continue with 30 min of aerobic exercise without signs/symptoms  of physical distress.       Intensity THRR unchanged         Progression   Progression Continue to progress workloads to maintain intensity without signs/symptoms of physical distress.       Average METs 5         Resistance Training   Training Prescription No       Weight No weights on Wednesdays         Interval Training   Interval Training No         Bike   Level 4       Watts 71       Minutes 15       METs 5.2         Recumbant Elliptical   Level 6       RPM 74       Watts 122       Minutes 15       METs 4.6                Exercise Comments:   Exercise Comments     Row Name 09/29/22 1441 10/17/22 1522 10/24/22 1526 11/14/22 1500 12/10/22 1500   Exercise Comments Pt's first day in the CRP2 program. Pt felt workloads were too light, we will adjust workloads for next session. Reviewed METs. Pt is making excellent progress with no complaints. Reviewed goals with patient today. Pt is progressing Reviewed METs and goals. Pt is making good progress on his MET level. Will continue to encourage increases in workloads as  patient feels comfortable tp do so. Reviewed METs and goals. Pt is making progress on MET level.            Exercise Goals and Review:   Exercise Goals     Row Name 09/25/22 1322             Exercise Goals   Increase Physical Activity Yes       Intervention Provide advice, education, support and counseling about physical activity/exercise needs.;Develop an individualized exercise prescription for aerobic and resistive training based on initial evaluation findings, risk stratification, comorbidities and participant's personal goals.       Expected Outcomes Long Term: Exercising regularly at least 3-5 days a week.;Short Term: Attend rehab on a regular basis to increase amount of physical activity.;Long Term: Add in home exercise to make exercise part of routine and to increase amount of physical activity.       Increase Strength and Stamina Yes       Intervention Provide advice, education, support and counseling about physical activity/exercise needs.;Develop an individualized exercise prescription for aerobic and resistive training based on initial evaluation findings, risk stratification, comorbidities and participant's personal goals.       Expected Outcomes Short Term: Increase workloads from initial exercise prescription for resistance, speed, and METs.;Short Term: Perform resistance training exercises routinely during rehab and add in resistance training at home;Long Term: Improve cardiorespiratory fitness, muscular endurance and strength as measured by increased METs and functional capacity ( )       Able to understand and use rate of perceived exertion (RPE) scale Yes       Intervention Provide education and explanation on how to use RPE scale       Expected Outcomes Short Term: Able to use RPE daily in rehab to express subjective intensity level;Long Term:  Able to use RPE to guide intensity level when exercising independently       Knowledge  and understanding of Target Heart  Rate Range (THRR) Yes       Intervention Provide education and explanation of THRR including how the numbers were predicted and where they are located for reference       Expected Outcomes Short Term: Able to state/look up THRR;Long Term: Able to use THRR to govern intensity when exercising independently;Short Term: Able to use daily as guideline for intensity in rehab       Understanding of Exercise Prescription Yes       Intervention Provide education, explanation, and written materials on patient's individual exercise prescription       Expected Outcomes Short Term: Able to explain program exercise prescription;Long Term: Able to explain home exercise prescription to exercise independently                Exercise Goals Re-Evaluation :  Exercise Goals Re-Evaluation     Row Name 09/29/22 1440 10/24/22 1524 11/14/22 1500 12/10/22 1500       Exercise Goal Re-Evaluation   Exercise Goals Review Increase Physical Activity;Increase Strength and Stamina;Able to understand and use rate of perceived exertion (RPE) scale;Knowledge and understanding of Target Heart Rate Range (THRR);Understanding of Exercise Prescription Increase Physical Activity;Increase Strength and Stamina;Able to understand and use rate of perceived exertion (RPE) scale;Knowledge and understanding of Target Heart Rate Range (THRR);Understanding of Exercise Prescription Increase Physical Activity;Increase Strength and Stamina;Able to understand and use rate of perceived exertion (RPE) scale;Knowledge and understanding of Target Heart Rate Range (THRR);Understanding of Exercise Prescription Increase Physical Activity;Increase Strength and Stamina;Able to understand and use rate of perceived exertion (RPE) scale;Knowledge and understanding of Target Heart Rate Range (THRR);Understanding of Exercise Prescription    Comments Pt's first day in the CRP2 program. Pt understnads the exercise RX, RPE scale and THRR. Reviewed goals with  patient. Pt voices that his stamina has improved since beginning the program. Peak METs are 5.7. Weight loss is a goal, but has lost 1 kg. Reviewed METs and goals. Pt voices he is achieving his goal of increased stamina. Pt voices feeling stonger. Pt halos voices that he has gained the confidence to exercise alone, Reviewed METs and goals. Continues to progress. Has been out the last 2 weeks due to kidney stone. Continues to feel stonger.    Expected Outcomes Will continue to montior patient and progress exercise workloads as tolerated. Will continue to montior patient and progress exercise workloads as tolerated. Will continue to montior patient and progress exercise workloads as tolerated. Will continue to montior patient and progress exercise workloads as tolerated.             Discharge Exercise Prescription (Final Exercise Prescription Changes):  Exercise Prescription Changes - 12/11/22 0900       Response to Exercise   Blood Pressure (Admit) 110/70    Blood Pressure (Exercise) 128/78    Blood Pressure (Exit) 100/52    Heart Rate (Admit) 98 bpm    Heart Rate (Exercise) 135 bpm    Heart Rate (Exit) 107 bpm    Rating of Perceived Exertion (Exercise) 11    Symptoms None    Comments Reviewed goals and METs    Duration Continue with 30 min of aerobic exercise without signs/symptoms of physical distress.    Intensity THRR unchanged      Progression   Progression Continue to progress workloads to maintain intensity without signs/symptoms of physical distress.    Average METs 5      Resistance Training   Training Prescription No  Weight No weights on Wednesdays      Interval Training   Interval Training No      Bike   Level 4    Watts 71    Minutes 15    METs 5.2      Recumbant Elliptical   Level 6    RPM 74    Watts 122    Minutes 15    METs 4.6             Nutrition:  Target Goals: Understanding of nutrition guidelines, daily intake of sodium 1500mg ,  cholesterol 200mg , calories 30% from fat and 7% or less from saturated fats, daily to have 5 or more servings of fruits and vegetables.  Biometrics:  Pre Biometrics - 09/25/22 1216       Pre Biometrics   Waist Circumference 44.5 inches    Hip Circumference 47.25 inches    Waist to Hip Ratio 0.94 %    Triceps Skinfold 24 mm    % Body Fat 31.8 %    Grip Strength 46 kg    Flexibility 15.75 in    Single Leg Stand 30 seconds              Nutrition Therapy Plan and Nutrition Goals:  Nutrition Therapy & Goals - 12/05/22 1333       Nutrition Therapy   Diet Heart Healthy Diet      Personal Nutrition Goals   Nutrition Goal Patient to identify strategies for reducing cardiovascular risk by attending the [employer] education and nutrition series weekly.    Personal Goal #2 Patient to improve diet quality by using the plate method as a daily guide for meal planning to including lean protein/plant protein, fruits, vegetables, whole gains and nonfat dairy.    Personal Goal #3 Patient to identify strategies for weight loss of 0.5-2.0#.    Comments Thomas Brass has not attended [facility] since 11/21/22. Prior to this, Thomas Brass reported that he has drastically reduced fried foods and he was cooking more at home. Per last documentated weight, Thomas Brass is down 3# since starting with our program. He continues mindfulness of sodium intake, saturated fat intake, and fiber intake. His wife was recently diagnosed a multiple food allergies that they are navigating. He will benefit from participation in intensive cardiac rehab for nutrition, exercise and lifestyle modification.      Intervention Plan   Intervention Prescribe, educate and counsel regarding individualized specific dietary modifications aiming towards targeted core components such as weight, hypertension, lipid management, diabetes, heart failure and other comorbidities.;Nutrition handout(s) given to patient.    Expected Outcomes Short Term Goal: Understand basic  principles of dietary content, such as calories, fat, sodium, cholesterol and nutrients.;Long Term Goal: Adherence to prescribed nutrition plan.             Nutrition Assessments:  Nutrition Assessments - 09/25/22 1215       Rate Your Plate Scores   Pre Score 57            MEDIFICTS Score Key: ?70 Need to make dietary changes  40-70 Heart Healthy Diet ? 40 Therapeutic Level Cholesterol Diet   Flowsheet Row INTENSIVE CARDIAC REHAB ORIENT from 09/25/2022 in [facility] for Heart, Vascular, & Lung Health  Picture Your Plate Total Score on Admission 57      Picture Your Plate Scores: <56 Unhealthy dietary pattern with much room for improvement. 41-50 Dietary pattern unlikely to meet recommendations for good health and room for improvement. 51-60  More healthful dietary pattern, with some room for improvement.  >60 Healthy dietary pattern, although there may be some specific behaviors that could be improved.    Nutrition Goals Re-Evaluation:  Nutrition Goals Re-Evaluation     Row Name 09/29/22 1510 11/03/22 1423 12/05/22 1333         Goals   Current Weight 234 lb 5.6 oz (106.3 kg) 232 lb 2.3 oz (105.3 kg) 229 lb 4.5 oz (104 kg)     Comment -- -- AST 50, ALT 77, Cr 1.4     Expected Outcome Thomas Brass reports motivation to lose weight since gaining weight on prednisone post-operatively. He acknowledges over-snacking. He will benefit from participation in intensive cardiac rehab for nutrition, exercise and lifestyle modification. Goals in action. Thomas Brass reports that he has drastically reduced fried foods. He is cooking more at home. He has not met weight loss goals but has maintained his weight since starting with our program. He continues mindfulness of sodium intake, saturated fat intake, and fiber intake. His wife was recently diagnosed a multiple food allergies that they are navigating. He will benefit from participation in intensive cardiac rehab for  nutrition, exercise and lifestyle modification. Thomas Brass has not attended [facility] since 11/21/22. Prior to this, Thomas Brass reported that he has drastically reduced fried foods and he was cooking more at home. Per last documentated weight, Thomas Brass is down 3# since starting with our program. He continues mindfulness of sodium intake, saturated fat intake, and fiber intake. His wife was recently diagnosed a multiple food allergies that they are navigating. He will benefit from participation in intensive cardiac rehab for nutrition, exercise and lifestyle modification.              Nutrition Goals Re-Evaluation:  Nutrition Goals Re-Evaluation     Row Name 09/29/22 1510 11/03/22 1423 12/05/22 1333         Goals   Current Weight 234 lb 5.6 oz (106.3 kg) 232 lb 2.3 oz (105.3 kg) 229 lb 4.5 oz (104 kg)     Comment -- -- AST 50, ALT 77, Cr 1.4     Expected Outcome Thomas Brass reports motivation to lose weight since gaining weight on prednisone post-operatively. He acknowledges over-snacking. He will benefit from participation in intensive cardiac rehab for nutrition, exercise and lifestyle modification. Goals in action. Thomas Brass reports that he has drastically reduced fried foods. He is cooking more at home. He has not met weight loss goals but has maintained his weight since starting with our program. He continues mindfulness of sodium intake, saturated fat intake, and fiber intake. His wife was recently diagnosed a multiple food allergies that they are navigating. He will benefit from participation in intensive cardiac rehab for nutrition, exercise and lifestyle modification. Thomas Brass has not attended [facility] since 11/21/22. Prior to this, Thomas Brass reported that he has drastically reduced fried foods and he was cooking more at home. Per last documentated weight, Thomas Brass is down 3# since starting with our program. He continues mindfulness of sodium intake, saturated fat intake, and fiber intake. His wife was recently diagnosed a multiple food  allergies that they are navigating. He will benefit from participation in intensive cardiac rehab for nutrition, exercise and lifestyle modification.              Nutrition Goals Discharge (Final Nutrition Goals Re-Evaluation):  Nutrition Goals Re-Evaluation - 12/05/22 1333       Goals   Current Weight 229 lb 4.5 oz (104 kg)    Comment AST 50, ALT  77, Cr 1.4    Expected Outcome Thomas Brass has not attended [facility] since 11/21/22. Prior to this, Thomas Brass reported that he has drastically reduced fried foods and he was cooking more at home. Per last documentated weight, Thomas Brass is down 3# since starting with our program. He continues mindfulness of sodium intake, saturated fat intake, and fiber intake. His wife was recently diagnosed a multiple food allergies that they are navigating. He will benefit from participation in intensive cardiac rehab for nutrition, exercise and lifestyle modification.             Psychosocial: Target Goals: Acknowledge presence or absence of significant depression and/or stress, maximize coping skills, provide positive support system. Participant is able to verbalize types and ability to use techniques and skills needed for reducing stress and depression.  Initial Review & Psychosocial Screening:  Initial Psych Review & Screening - 09/25/22 1323       Initial Review   Current issues with None Identified      Family Dynamics   Good Support System? Yes   Thomas Brass has his wife, and Mom for support     Barriers   Psychosocial barriers to participate in program There are no identifiable barriers or psychosocial needs.;The patient should benefit from training in stress management and relaxation.      Screening Interventions   Interventions Encouraged to exercise             Quality of Life Scores:  Quality of Life - 09/25/22 1224       Quality of Life   Select Quality of Life      Quality of Life Scores   Health/Function Pre 26.8 %    Socioeconomic Pre 20.25 %     Psych/Spiritual Pre 24.86 %    Family Pre 26.4 %    GLOBAL Pre 24.86 %            Scores of 19 and below usually indicate a poorer quality of life in these areas.  A difference of  2-3 points is a clinically meaningful difference.  A difference of 2-3 points in the total score of the Quality of Life Index has been associated with significant improvement in overall quality of life, self-image, physical symptoms, and general health in studies assessing change in quality of life.  PHQ-9: Review Flowsheet       09/25/2022 08/12/2021 09/18/2017 08/11/2016  Depression screen PHQ 2/9  Decreased Interest 0 0 0 0  Down, Depressed, Hopeless 0 0 0 0  PHQ - 2 Score 0 0 0 0  Altered sleeping 0 - - -  Tired, decreased energy 0 - - -  Change in appetite 0 - - -  Feeling bad or failure about yourself  0 - - -  Trouble concentrating 0 - - -  Moving slowly or fidgety/restless 0 - - -  Suicidal thoughts 0 - - -  PHQ-9 Score 0 - - -   Interpretation of Total Score  Total Score Depression Severity:  1-4 = Minimal depression, 5-9 = Mild depression, 10-14 = Moderate depression, 15-19 = Moderately severe depression, 20-27 = Severe depression   Psychosocial Evaluation and Intervention:   Psychosocial Re-Evaluation:  Psychosocial Re-Evaluation     Row Name 09/29/22 1738 10/28/22 1610 11/19/22 1655 12/11/22 1731       Psychosocial Re-Evaluation   Current issues with None Identified None Identified None Identified None Identified    Comments -- No psychosocial need identified at this time No psychosocial need identified  at this time No psychosocial need identified at this time    Interventions Encouraged to attend Cardiac Rehabilitation for the exercise Encouraged to attend Cardiac Rehabilitation for the exercise Encouraged to attend Cardiac Rehabilitation for the exercise Encouraged to attend Cardiac Rehabilitation for the exercise    Continue Psychosocial Services  No Follow up required No  Follow up required No Follow up required No Follow up required             Psychosocial Discharge (Final Psychosocial Re-Evaluation):  Psychosocial Re-Evaluation - 12/11/22 1731       Psychosocial Re-Evaluation   Current issues with None Identified    Comments No psychosocial need identified at this time    Interventions Encouraged to attend Cardiac Rehabilitation for the exercise    Continue Psychosocial Services  No Follow up required             Vocational Rehabilitation: Provide vocational rehab assistance to qualifying candidates.   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 Participant will state understanding/return demonstration of topics presented.    Education     Row Name 09/29/22 1600     Education   Cardiac Education Topics Pritikin   Geographical information systems officer Exercise   Exercise Workshop Exercise Basics: Building Your Action Plan   Instruction Review Code 1- Verbalizes Understanding   Class Start Time 1359   Class Stop Time 1445   Class Time Calculation (min) 46 min    Row Name 10/01/22 1600     Education   Cardiac Education Topics Pritikin   Customer service manager   Weekly Topic Efficiency Cooking - Meals in a Snap   Instruction Review Code 1- Verbalizes Understanding   Class Start Time 1355   Class Stop Time 1437   Class Time Calculation (min) 42 min    Row Name 10/03/22 1500     Education   Cardiac Education Topics Pritikin   Therapist, art Exercise Education   Exercise Education Move It!   Instruction Review Code 1- Verbalizes Understanding   Class Start Time 1405   Class Stop Time 1442   Class Time Calculation (min) 37 min    Row Name 10/06/22 1629     Education   Cardiac Education Topics Pritikin     Core Videos   Educator Dietitian   Select Nutrition   Instruction Review Code 1- Verbalizes Understanding   Class Start Time 1400   Class Stop Time 1447   Class Time Calculation (min) 47 min    Row Name 10/10/22 1600     Education   Cardiac Education Topics Pritikin   Psychologist, forensic General Education   General Education Hypertension and Heart Disease   Instruction Review Code 1- Verbalizes Understanding   Class Start Time 1410   Class Stop Time 1450   Class  Time Calculation (min) 40 min    Row Name 10/13/22 1500     Education   Cardiac Education Topics Pritikin   Geographical information systems officer Psychosocial   Psychosocial Workshop Healthy Sleep for a Healthy Heart   Instruction Review Code 1- Verbalizes Understanding   Class Start Time 1400   Class Stop Time 1450   Class Time Calculation (min) 50 min    Row Name 10/15/22 1500     Education   Cardiac Education Topics Pritikin   Customer service manager   Weekly Topic Comforting Weekend Breakfasts   Instruction Review Code 1- Verbalizes Understanding   Class Start Time 1400   Class Stop Time 1445   Class Time Calculation (min) 45 min    Row Name 10/17/22 1500     Education   Cardiac Education Topics Pritikin   Licensed conveyancer Nutrition   Nutrition Dining Out - Part 1   Instruction Review Code 1- Verbalizes Understanding   Class Start Time 1405   Class Stop Time 1440   Class Time Calculation (min) 35 min            Core  Videos: Exercise    Move It!  Clinical staff conducted group or individual video education with verbal and written material and guidebook.  Patient learns the recommended Pritikin exercise program. Exercise with the goal of living a long, healthy life. Some of the health benefits of exercise include controlled diabetes, healthier blood pressure levels, improved cholesterol levels, improved heart and lung capacity, improved sleep, and better body composition. Everyone should speak with their doctor before starting or changing an exercise routine.  Biomechanical Limitations Clinical staff conducted group or individual video education with verbal and written material and guidebook.  Patient learns how biomechanical limitations can impact exercise and how we can mitigate and possibly overcome limitations to have an impactful and balanced exercise routine.  Body Composition Clinical staff conducted group or individual video education with verbal and written material and guidebook.  Patient learns that body composition (ratio of muscle mass to fat mass) is a key component to assessing overall fitness, rather than body weight alone. Increased fat mass, especially visceral belly fat, can put [place] at increased risk for metabolic syndrome, type 2 diabetes, heart disease, and even death. It is recommended to combine diet and exercise (cardiovascular and resistance training) to improve your body composition. Seek guidance from your physician and exercise physiologist before implementing an exercise routine.  Exercise Action Plan Clinical staff conducted group or individual video education with verbal and written material and guidebook.  Patient learns the recommended strategies to achieve and enjoy long-term exercise adherence, including variety, self-motivation, self-efficacy, and positive decision making. Benefits of exercise include fitness, good health, weight management, more energy, better sleep, less  stress, and overall well-being.  Medical   Heart Disease Risk Reduction Clinical staff conducted group or individual video education with verbal and written material and guidebook.  Patient learns our heart is our most vital organ as it circulates oxygen, nutrients, white blood cells, and hormones throughout the entire body, and carries waste away. Data supports a plant-based eating plan like the Pritikin Program for its effectiveness in slowing progression of and reversing heart disease. The video provides a number of recommendations to address heart disease.  Metabolic Syndrome and Belly Fat  Clinical staff conducted group or individual video education with verbal and written material and guidebook.  Patient learns what metabolic syndrome is, how it leads to heart disease, and how one can reverse it and keep it from coming back. You have metabolic syndrome if you have 3 of the following 5 criteria: abdominal obesity, high blood pressure, high triglycerides, low HDL cholesterol, and high blood sugar.  Hypertension and Heart Disease Clinical staff conducted group or individual video education with verbal and written material and guidebook.  Patient learns that high blood pressure, or hypertension, is very common in the [place]. Hypertension is largely due to excessive salt intake, but other important risk factors include being overweight, physical inactivity, drinking too much alcohol, smoking, and not eating enough potassium from fruits and vegetables. High blood pressure is a leading risk factor for heart attack, stroke, congestive heart failure, dementia, kidney failure, and premature death. Long-term effects of excessive salt intake include stiffening of the arteries and thickening of heart muscle and organ damage. Recommendations include ways to reduce hypertension and the risk of heart disease.  Diseases of Our Time - Focusing on Diabetes Clinical staff conducted group or individual  video education with verbal and written material and guidebook.  Patient learns why the best way to stop diseases of our time is prevention, through food and other lifestyle changes. Medicine (such as prescription pills and surgeries) is often only a Band-Aid on the problem, not a long-term solution. Most common diseases of our time include obesity, type 2 diabetes, hypertension, heart disease, and cancer. The Pritikin Program is recommended and has been proven to help reduce, reverse, and/or prevent the damaging effects of metabolic syndrome.  Nutrition   Overview of the Pritikin Eating Plan  Clinical staff conducted group or individual video education with verbal and written material and guidebook.  Patient learns about the Pritikin Eating Plan for disease risk reduction. The Pritikin Eating Plan emphasizes a wide variety of unrefined, minimally-processed carbohydrates, like fruits, vegetables, whole grains, and legumes. Go, Caution, and Stop food choices are explained. Plant-based and lean animal proteins are emphasized. Rationale provided for low sodium intake for blood pressure control, low added sugars for blood sugar stabilization, and low added fats and oils for coronary artery disease risk reduction and weight management.  Calorie Density  Clinical staff conducted group or individual video education with verbal and written material and guidebook.  Patient learns about calorie density and how it impacts the Pritikin Eating Plan. Knowing the characteristics of the food you choose will help you decide whether those foods will lead to weight gain or weight loss, and whether you want to consume more or less of them. Weight loss is usually a side effect of the Pritikin Eating Plan because of its focus on low calorie-dense foods.  Label Reading  Clinical staff conducted group or individual video education with verbal and written material and guidebook.  Patient learns about the Pritikin recommended  label reading guidelines and corresponding recommendations regarding calorie density, added sugars, sodium content, and whole grains.  Dining Out - Part 1  Clinical staff conducted group or individual video education with verbal and written material and guidebook.  Patient learns that restaurant meals can be sabotaging because they can be so high in calories, fat, sodium, and/or sugar. Patient learns recommended strategies on how to positively address this and avoid unhealthy pitfalls.  Facts on Fats  Clinical staff conducted group or individual video education with  verbal and written material and guidebook.  Patient learns that lifestyle modifications can be just as effective, if not more so, as many medications for lowering your risk of heart disease. A Pritikin lifestyle can help to reduce your risk of inflammation and atherosclerosis (cholesterol build-up, or plaque, in the artery walls). Lifestyle interventions such as dietary choices and physical activity address the cause of atherosclerosis. A review of the types of fats and their impact on blood cholesterol levels, along with dietary recommendations to reduce fat intake is also included.  Nutrition Action Plan  Clinical staff conducted group or individual video education with verbal and written material and guidebook.  Patient learns how to incorporate Pritikin recommendations into their lifestyle. Recommendations include planning and keeping personal health goals in mind as an important part of their success.  Healthy Mind-Set    Healthy Minds, Bodies, Hearts  Clinical staff conducted group or individual video education with verbal and written material and guidebook.  Patient learns how to identify when they are stressed. Video will discuss the impact of that stress, as well as the many benefits of stress management. Patient will also be introduced to stress management techniques. The way we think, act, and feel has an impact on our  hearts.  How Our Thoughts Can Heal Our Hearts  Clinical staff conducted group or individual video education with verbal and written material and guidebook.  Patient learns that negative thoughts can cause depression and anxiety. This can result in negative lifestyle behavior and serious health problems. Cognitive behavioral therapy is an effective method to help control our thoughts in order to change and improve our emotional outlook.  Additional Videos:  Exercise    Improving Performance  Clinical staff conducted group or individual video education with verbal and written material and guidebook.  Patient learns to use a non-linear approach by alternating intensity levels and lengths of time spent exercising to help burn more calories and lose more body fat. Cardiovascular exercise helps improve heart health, metabolism, hormonal balance, blood sugar control, and recovery from fatigue. Resistance training improves strength, endurance, balance, coordination, reaction time, metabolism, and muscle mass. Flexibility exercise improves circulation, posture, and balance. Seek guidance from your physician and exercise physiologist before implementing an exercise routine and learn your capabilities and proper form for all exercise.  Introduction to Yoga  Clinical staff conducted group or individual video education with verbal and written material and guidebook.  Patient learns about yoga, a discipline of the coming together of mind, breath, and body. The benefits of yoga include improved flexibility, improved range of motion, better posture and core strength, increased lung function, weight loss, and positive self-image. Yoga's heart health benefits include lowered blood pressure, healthier heart rate, decreased cholesterol and triglyceride levels, improved immune function, and reduced stress. Seek guidance from your physician and exercise physiologist before implementing an exercise routine and learn your  capabilities and proper form for all exercise.  Medical   Aging: Enhancing Your Quality of Life  Clinical staff conducted group or individual video education with verbal and written material and guidebook.  Patient learns key strategies and recommendations to stay in good physical health and enhance quality of life, such as prevention strategies, having an advocate, securing a Health Care Proxy and Power of Attorney, and keeping a list of medications and system for tracking them. It also discusses how to avoid risk for bone loss.  Biology of Weight Control  Clinical staff conducted group or individual video education with verbal and written  material and guidebook.  Patient learns that weight gain occurs because we consume more calories than we burn (eating more, moving less). Even if your body weight is normal, you may have higher ratios of fat compared to muscle mass. Too much body fat puts you at increased risk for cardiovascular disease, heart attack, stroke, type 2 diabetes, and obesity-related cancers. In addition to exercise, following the Pritikin Eating Plan can help reduce your risk.  Decoding Lab Results  Clinical staff conducted group or individual video education with verbal and written material and guidebook.  Patient learns that lab test reflects one measurement whose values change over time and are influenced by many factors, including medication, stress, sleep, exercise, food, hydration, pre-existing medical conditions, and more. It is recommended to use the knowledge from this video to become more involved with your lab results and evaluate your numbers to speak with your doctor.   Diseases of Our Time - Overview  Clinical staff conducted group or individual video education with verbal and written material and guidebook.  Patient learns that according to the CDC, 50% to 70% of chronic diseases (such as obesity, type 2 diabetes, elevated lipids, hypertension, and heart disease) are  avoidable through lifestyle improvements including healthier food choices, listening to satiety cues, and increased physical activity.  Sleep Disorders Clinical staff conducted group or individual video education with verbal and written material and guidebook.  Patient learns how good quality and duration of sleep are important to overall health and well-being. Patient also learns about sleep disorders and how they impact health along with recommendations to address them, including discussing with a physician.  Nutrition  Dining Out - Part 2 Clinical staff conducted group or individual video education with verbal and written material and guidebook.  Patient learns how to plan ahead and communicate in order to maximize their dining experience in a healthy and nutritious manner. Included are recommended food choices based on the type of restaurant the patient is visiting.   Fueling a Banker conducted group or individual video education with verbal and written material and guidebook.  There is a strong connection between our food choices and our health. Diseases like obesity and type 2 diabetes are very prevalent and are in large-part due to lifestyle choices. The Pritikin Eating Plan provides plenty of food and hunger-curbing satisfaction. It is easy to follow, affordable, and helps reduce health risks.  Menu Workshop  Clinical staff conducted group or individual video education with verbal and written material and guidebook.  Patient learns that restaurant meals can sabotage health goals because they are often packed with calories, fat, sodium, and sugar. Recommendations include strategies to plan ahead and to communicate with the manager, chef, or server to help order a healthier meal.  Planning Your Eating Strategy  Clinical staff conducted group or individual video education with verbal and written material and guidebook.  Patient learns about the Pritikin Eating Plan and  its benefit of reducing the risk of disease. The Pritikin Eating Plan does not focus on calories. Instead, it emphasizes high-quality, nutrient-rich foods. By knowing the characteristics of the foods, we choose, we can determine their calorie density and make informed decisions.  Targeting Your Nutrition Priorities  Clinical staff conducted group or individual video education with verbal and written material and guidebook.  Patient learns that lifestyle habits have a tremendous impact on disease risk and progression. This video provides eating and physical activity recommendations based on your personal health goals, such as  reducing LDL cholesterol, losing weight, preventing or controlling type 2 diabetes, and reducing high blood pressure.  Vitamins and Minerals  Clinical staff conducted group or individual video education with verbal and written material and guidebook.  Patient learns different ways to obtain key vitamins and minerals, including through a recommended healthy diet. It is important to discuss all supplements you take with your doctor.   Healthy Mind-Set    Smoking Cessation  Clinical staff conducted group or individual video education with verbal and written material and guidebook.  Patient learns that cigarette smoking and tobacco addiction pose a serious health risk which affects millions of people. Stopping smoking will significantly reduce the risk of heart disease, lung disease, and many forms of cancer. Recommended strategies for quitting are covered, including working with your doctor to develop a successful plan.  Culinary   Becoming a Set designer conducted group or individual video education with verbal and written material and guidebook.  Patient learns that cooking at home can be healthy, cost-effective, quick, and puts them in control. Keys to cooking healthy recipes will include looking at your recipe, assessing your equipment needs, planning ahead,  making it simple, choosing cost-effective seasonal ingredients, and limiting the use of added fats, salts, and sugars.  Cooking - Breakfast and Snacks  Clinical staff conducted group or individual video education with verbal and written material and guidebook.  Patient learns how important breakfast is to satiety and nutrition through the entire day. Recommendations include key foods to eat during breakfast to help stabilize blood sugar levels and to prevent overeating at meals later in the day. Planning ahead is also a key component.  Cooking - Educational psychologist conducted group or individual video education with verbal and written material and guidebook.  Patient learns eating strategies to improve overall health, including an approach to cook more at home. Recommendations include thinking of animal protein as a side on your plate rather than center stage and focusing instead on lower calorie dense options like vegetables, fruits, whole grains, and plant-based proteins, such as beans. Making sauces in large quantities to freeze for later and leaving the skin on your vegetables are also recommended to maximize your experience.  Cooking - Healthy Salads and Dressing Clinical staff conducted group or individual video education with verbal and written material and guidebook.  Patient learns that vegetables, fruits, whole grains, and legumes are the foundations of the Pritikin Eating Plan. Recommendations include how to incorporate each of these in flavorful and healthy salads, and how to create homemade salad dressings. Proper handling of ingredients is also covered. Cooking - Soups and [employer] - Soups and Desserts Clinical staff conducted group or individual video education with verbal and written material and guidebook.  Patient learns that Pritikin soups and desserts make for easy, nutritious, and delicious snacks and meal components that are low in sodium, fat, sugar, and  calorie density, while high in vitamins, minerals, and filling fiber. Recommendations include simple and healthy ideas for soups and desserts.   Overview     The Pritikin Solution Program Overview Clinical staff conducted group or individual video education with verbal and written material and guidebook.  Patient learns that the results of the Pritikin Program have been documented in more than 100 articles published in peer-reviewed journals, and the benefits include reducing risk factors for (and, in some cases, even reversing) high cholesterol, high blood pressure, type 2 diabetes, obesity, and more! An overview of  the three key pillars of the Pritikin Program will be covered: eating well, doing regular exercise, and having a healthy mind-set.  WORKSHOPS  Exercise: Exercise Basics: Building Your Action Plan Clinical staff led group instruction and group discussion with PowerPoint presentation and patient guidebook. To enhance the learning environment the use of posters, models and videos may be added. At the conclusion of this workshop, patients will comprehend the difference between physical activity and exercise, as well as the benefits of incorporating both, into their routine. Patients will understand the FITT (Frequency, Intensity, Time, and Type) principle and how to use it to build an exercise action plan. In addition, safety concerns and other considerations for exercise and cardiac rehab will be addressed by the presenter. The purpose of this lesson is to promote a comprehensive and effective weekly exercise routine in order to improve patients' overall level of fitness.   Managing Heart Disease: Your Path to a Healthier Heart Clinical staff led group instruction and group discussion with PowerPoint presentation and patient guidebook. To enhance the learning environment the use of posters, models and videos may be added.At the conclusion of this workshop, patients will understand the  anatomy and physiology of the heart. Additionally, they will understand how Pritikin's three pillars impact the risk factors, the progression, and the management of heart disease.  The purpose of this lesson is to provide a high-level overview of the heart, heart disease, and how the Pritikin lifestyle positively impacts risk factors.  Exercise Biomechanics Clinical staff led group instruction and group discussion with PowerPoint presentation and patient guidebook. To enhance the learning environment the use of posters, models and videos may be added. Patients will learn how the structural parts of their bodies function and how these functions impact their daily activities, movement, and exercise. Patients will learn how to promote a neutral spine, learn how to manage pain, and identify ways to improve their physical movement in order to promote healthy living. The purpose of this lesson is to expose patients to common physical limitations that impact physical activity. Participants will learn practical ways to adapt and manage aches and pains, and to minimize their effect on regular exercise. Patients will learn how to maintain good posture while sitting, walking, and lifting.  Balance Training and Fall Prevention  Clinical staff led group instruction and group discussion with PowerPoint presentation and patient guidebook. To enhance the learning environment the use of posters, models and videos may be added. At the conclusion of this workshop, patients will understand the importance of their sensorimotor skills (vision, proprioception, and the vestibular system) in maintaining their ability to balance as they age. Patients will apply a variety of balancing exercises that are appropriate for their current level of function. Patients will understand the common causes for poor balance, possible solutions to these problems, and ways to modify their physical environment in order to minimize their  fall risk. The purpose of this lesson is to teach patients about the importance of maintaining balance as they age and ways to minimize their risk of falling.  WORKSHOPS   Nutrition:  Fueling a Ship broker led group instruction and group discussion with PowerPoint presentation and patient guidebook. To enhance the learning environment the use of posters, models and videos may be added. Patients will review the foundational principles of the Pritikin Eating Plan and understand what constitutes a serving size in each of the food groups. Patients will also learn Pritikin-friendly foods that are better choices when away from home  and review make-ahead meal and snack options. Calorie density will be reviewed and applied to three nutrition priorities: weight maintenance, weight loss, and weight gain. The purpose of this lesson is to reinforce (in a group setting) the key concepts around what patients are recommended to eat and how to apply these guidelines when away from home by planning and selecting Pritikin-friendly options. Patients will understand how calorie density may be adjusted for different weight management goals.  Mindful Eating  Clinical staff led group instruction and group discussion with PowerPoint presentation and patient guidebook. To enhance the learning environment the use of posters, models and videos may be added. Patients will briefly review the concepts of the Pritikin Eating Plan and the importance of low-calorie dense foods. The concept of mindful eating will be introduced as well as the importance of paying attention to internal hunger signals. Triggers for non-hunger eating and techniques for dealing with triggers will be explored. The purpose of this lesson is to provide patients with the opportunity to review the basic principles of the Pritikin Eating Plan, discuss the value of eating mindfully and how to measure internal cues of hunger and fullness using the  Hunger Scale. Patients will also discuss reasons for non-hunger eating and learn strategies to use for controlling emotional eating.  Targeting Your Nutrition Priorities Clinical staff led group instruction and group discussion with PowerPoint presentation and patient guidebook. To enhance the learning environment the use of posters, models and videos may be added. Patients will learn how to determine their genetic susceptibility to disease by reviewing their family history. Patients will gain insight into the importance of diet as part of an overall healthy lifestyle in mitigating the impact of genetics and other environmental insults. The purpose of this lesson is to provide patients with the opportunity to assess their personal nutrition priorities by looking at their family history, their own health history and current risk factors. Patients will also be able to discuss ways of prioritizing and modifying the Pritikin Eating Plan for their highest risk areas  Menu  Clinical staff led group instruction and group discussion with PowerPoint presentation and patient guidebook. To enhance the learning environment the use of posters, models and videos may be added. Using menus brought in from [employer], or printed from [employer], patients will apply the Pritikin dining out guidelines that were presented in the [employer] video. Patients will also be able to practice these guidelines in a variety of provided scenarios. The purpose of this lesson is to provide patients with the opportunity to practice hands-on learning of the Pritikin Dining Out guidelines with actual menus and practice scenarios.  Label Reading Clinical staff led group instruction and group discussion with PowerPoint presentation and patient guidebook. To enhance the learning environment the use of posters, models and videos may be added. Patients will review and discuss the Pritikin label reading guidelines  presented in Pritikin's Label Reading Educational series video. Using fool labels brought in from local grocery stores and markets, patients will apply the label reading guidelines and determine if the packaged food meet the Pritikin guidelines. The purpose of this lesson is to provide patients with the opportunity to review, discuss, and practice hands-on learning of the Pritikin Label Reading guidelines with actual packaged food labels. Cooking School  Pritikin's [employer] are designed to teach patients ways to prepare quick, simple, and affordable recipes at home. The importance of nutrition's role in chronic disease risk reduction is reflected in its  emphasis in the overall Pritikin program. By learning how to prepare essential core Pritikin Eating Plan recipes, patients will increase control over what they eat; be able to customize the flavor of foods without the use of added salt, sugar, or fat; and improve the quality of the food they consume. By learning a set of core recipes which are easily assembled, quickly prepared, and affordable, patients are more likely to prepare more healthy foods at home. These workshops focus on convenient breakfasts, simple entres, side dishes, and desserts which can be prepared with minimal effort and are consistent with nutrition recommendations for cardiovascular risk reduction. Cooking [employer] are taught by a Armed forces logistics/support/administrative officer (RD) who has been trained by the [employer]. The chef or RD has a clear understanding of the importance of minimizing - if not completely eliminating - added fat, sugar, and sodium in recipes. Throughout the series of Cooking School Workshop sessions, patients will learn about healthy ingredients and efficient methods of cooking to build confidence in their capability to prepare    Cooking School weekly topics:  Adding Flavor- Sodium-Free  Fast and Healthy Breakfasts  Powerhouse Plant-Based  Proteins  Satisfying Salads and Dressings  Simple Sides and Sauces  International Cuisine-Spotlight on the [employer] Zones  Delicious Desserts  Savory Soups  [employer] - Meals in a Astronomer Appetizers and Snacks  Comforting Weekend Breakfasts  One-Pot Wonders   Fast Evening Meals  Landscape architect Your Pritikin Plate  WORKSHOPS   Healthy Mindset (Psychosocial):  Focused Goals, Sustainable Changes Clinical staff led group instruction and group discussion with PowerPoint presentation and patient guidebook. To enhance the learning environment the use of posters, models and videos may be added. Patients will be able to apply effective goal setting strategies to establish at least one personal goal, and then take consistent, meaningful action toward that goal. They will learn to identify common barriers to achieving personal goals and develop strategies to overcome them. Patients will also gain an understanding of how our mind-set can impact our ability to achieve goals and the importance of cultivating a positive and growth-oriented mind-set. The purpose of this lesson is to provide patients with a deeper understanding of how to set and achieve personal goals, as well as the tools and strategies needed to overcome common obstacles which may arise along the way.  From Head to Heart: The Power of a Healthy Outlook  Clinical staff led group instruction and group discussion with PowerPoint presentation and patient guidebook. To enhance the learning environment the use of posters, models and videos may be added. Patients will be able to recognize and describe the impact of emotions and mood on physical health. They will discover the importance of self-care and explore self-care practices which may work for them. Patients will also learn how to utilize the 4 C's to cultivate a healthier outlook and better manage stress and challenges. The purpose of this lesson is to demonstrate  to patients how a healthy outlook is an essential part of maintaining good health, especially as they continue their cardiac rehab journey.  Healthy Sleep for a Healthy Heart Clinical staff led group instruction and group discussion with PowerPoint presentation and patient guidebook. To enhance the learning environment the use of posters, models and videos may be added. At the conclusion of this workshop, patients will be able to demonstrate knowledge of the importance of sleep to overall health, well-being, and quality of life. They will understand the  symptoms of, and treatments for, common sleep disorders. Patients will also be able to identify daytime and nighttime behaviors which impact sleep, and they will be able to apply these tools to help manage sleep-related challenges. The purpose of this lesson is to provide patients with a general overview of sleep and outline the importance of quality sleep. Patients will learn about a few of the most common sleep disorders. Patients will also be introduced to the concept of "sleep hygiene," and discover ways to self-manage certain sleeping problems through simple daily behavior changes. Finally, the workshop will motivate patients by clarifying the links between quality sleep and their goals of heart-healthy living.   Recognizing and Reducing Stress Clinical staff led group instruction and group discussion with PowerPoint presentation and patient guidebook. To enhance the learning environment the use of posters, models and videos may be added. At the conclusion of this workshop, patients will be able to understand the types of stress reactions, differentiate between acute and chronic stress, and recognize the impact that chronic stress has on their health. They will also be able to apply different coping mechanisms, such as reframing negative self-talk. Patients will have the opportunity to practice a variety of stress management techniques, such as deep  abdominal breathing, progressive muscle relaxation, and/or guided imagery.  The purpose of this lesson is to educate patients on the role of stress in their lives and to provide healthy techniques for coping with it.  Learning Barriers/Preferences:   Education Topics:  Knowledge Questionnaire Score:  Knowledge Questionnaire Score - 09/25/22 1225       Knowledge Questionnaire Score   Pre Score 20/24             Core Components/Risk Factors/Patient Goals at Admission:  Personal Goals and Risk Factors at Admission - 09/25/22 1225       Core Components/Risk Factors/Patient Goals on Admission    Weight Management Yes;Obesity;Weight Loss    Intervention Weight Management: Develop a combined nutrition and exercise program designed to reach desired caloric intake, while maintaining appropriate intake of nutrient and fiber, sodium and fats, and appropriate energy expenditure required for the weight goal.;Weight Management: Provide education and appropriate resources to help participant work on and attain dietary goals.;Weight Management/Obesity: Establish reasonable short term and long term weight goals.;Obesity: Provide education and appropriate resources to help participant work on and attain dietary goals.    Admit Weight 232 lb 5.8 oz (105.4 kg)    Goal Weight: Long Term 180 lb (81.6 kg)    Expected Outcomes Long Term: Adherence to nutrition and physical activity/exercise program aimed toward attainment of established weight goal;Short Term: Continue to assess and modify interventions until short term weight is achieved;Weight Loss: Understanding of general recommendations for a balanced deficit meal plan, which promotes 1-2 lb weight loss per week and includes a negative energy balance of [phone] kcal/d;Understanding of distribution of calorie intake throughout the day with the consumption of 4-5 meals/snacks;Understanding recommendations for meals to include 15-35% energy as protein,  25-35% energy from fat, 35-60% energy from carbohydrates, less than 200mg  of dietary cholesterol, 20-35 gm of total fiber daily    Hypertension Yes    Intervention Provide education on lifestyle modifcations including regular physical activity/exercise, weight management, moderate sodium restriction and increased consumption of fresh fruit, vegetables, and low fat dairy, alcohol moderation, and smoking cessation.;Monitor prescription use compliance.    Expected Outcomes Short Term: Continued assessment and intervention until BP is < 140/43mm HG in hypertensive participants. < 130/49mm HG  in hypertensive participants with diabetes, heart failure or chronic kidney disease.;Long Term: Maintenance of blood pressure at goal levels.    Lipids Yes    Intervention Provide education and support for participant on nutrition & aerobic/resistive exercise along with prescribed medications to achieve LDL 70mg , HDL >40mg .    Expected Outcomes Short Term: Participant states understanding of desired cholesterol values and is compliant with medications prescribed. Participant is following exercise prescription and nutrition guidelines.;Long Term: Cholesterol controlled with medications as prescribed, with individualized exercise RX and with personalized nutrition plan. Value goals: LDL < 70mg , HDL > 40 mg.             Core Components/Risk Factors/Patient Goals Review:   Goals and Risk Factor Review     Row Name 09/29/22 1738 10/28/22 0931 11/19/22 1658 12/11/22 1732       Core Components/Risk Factors/Patient Goals Review   Personal Goals Review Weight Management/Obesity;Hypertension;Lipids Weight Management/Obesity;Hypertension;Lipids Weight Management/Obesity;Hypertension;Lipids Weight Management/Obesity;Hypertension;Lipids    Review Thomas Wood started [facility] cardiac rehab on 09/29/22 and did well with exercise [facility]. Vital signs have been stable. Thomas Wood   [facility] Vital signs have been stable. Thomas Wood has lost 1.6 kg since starting cardiac rehab Thomas Wood  [facility] Vital signs have been stable. Thomas Wood has lost 2.4  kg since starting cardiac rehab. Thomas Wood will complete intensive cardiac rehab on 12/19/22    Expected Outcomes Thomas Wood will continue to participate in intesive cardiac rehab for exercise, nutrition and lifestyle modifications Thomas Wood will continue to participate in intesive cardiac rehab for exercise, nutrition and lifestyle modifications Thomas Wood will continue to participate in intesive cardiac rehab for exercise, nutrition and lifestyle modifications Thomas Wood will continue to participate in intesive cardiac rehab for exercise, nutrition and lifestyle modifications             Core Components/Risk Factors/Patient Goals at Discharge (Final Review):   Goals and Risk Factor Review - 12/11/22 1732       Core Components/Risk Factors/Patient Goals Review   Personal Goals Review Weight Management/Obesity;Hypertension;Lipids    Review [facility] Wood  [facility] Vital signs have been stable. Thomas Wood has lost 2.4  kg since starting cardiac rehab. Thomas Wood will complete intensive cardiac rehab on 12/19/22    Expected Outcomes Thomas Wood will continue to participate in intesive cardiac rehab for exercise, nutrition and lifestyle modifications             ITP Comments:  ITP Comments     Row Name 09/25/22 1215 09/29/22 1737 10/28/22 0928 11/19/22 1654 12/11/22 1729   ITP Comments Dr. Armanda Magicraci Turner medical director. Introduction to pritikin education program/ intensive cardiac rehab. Initital orientation packet reviewed with patient. 30 day ITP Review. Thomas Wood started intensive cardiac rehab on 09/29/22 and did well with exercise. 30 day ITP Review. Thomas Wood has good attendance and participation in intensive cardiac rehab 30 day ITP  Review. Thomas Wood continues to  have good attendance and participation in intensive cardiac rehab. 30 day ITP Review. Thomas Wood has returned to cardiac rehab after being out with kidney stones and a GI virus. Thomas Wood has good participation in intensive cardiac rehab. Thomas Wood will complete intensive cardiac rehab on 12/19/22            Comments: See ITP comments

## 2022-12-12 ENCOUNTER — Encounter (HOSPITAL_COMMUNITY)
Admission: RE | Admit: 2022-12-12 | Discharge: 2022-12-12 | Disposition: A | Discharge status: Home / Self Care | Payer: 59 | Source: Ambulatory Visit | Attending: Cardiology | Admitting: Cardiology

## 2022-12-12 DIAGNOSIS — Z941 Heart transplant status: Secondary | ICD-10-CM | POA: Diagnosis not present

## 2022-12-12 NOTE — Progress Notes (Signed)
Reviewed home exercise Rx with patient today.  Encouraged warm-up, cool-down, and stretching. Reviewed THRR of 78-156 and keeping RPE between 11-13. Encouraged to hydrate with activity.  Reviewed weather parameters for temperature and humidity for safe exercise outdoors. Reviewed S/S to terminate exercise and when to call 911 vs MD. Pt encouraged to always carry a cell phone for safety when exercising outdoors. Pt verbalized understanding of the home exercise Rx and was provided a copy.   Lorin Picket MS, ACSM-CEP, CCRP

## 2022-12-15 ENCOUNTER — Encounter (HOSPITAL_COMMUNITY)
Admission: RE | Admit: 2022-12-15 | Discharge: 2022-12-15 | Disposition: A | Discharge status: Home / Self Care | Payer: 59 | Source: Ambulatory Visit | Attending: Cardiology | Admitting: Cardiology

## 2022-12-15 DIAGNOSIS — Z941 Heart transplant status: Secondary | ICD-10-CM

## 2022-12-17 ENCOUNTER — Encounter (HOSPITAL_COMMUNITY): Payer: 59

## 2022-12-19 ENCOUNTER — Encounter (HOSPITAL_COMMUNITY)
Admission: RE | Admit: 2022-12-19 | Discharge: 2022-12-19 | Disposition: A | Discharge status: Home / Self Care | Payer: 59 | Source: Ambulatory Visit | Attending: Cardiology | Admitting: Cardiology

## 2022-12-19 DIAGNOSIS — Z941 Heart transplant status: Secondary | ICD-10-CM | POA: Diagnosis not present

## 2022-12-22 ENCOUNTER — Encounter (HOSPITAL_COMMUNITY)
Admission: RE | Admit: 2022-12-22 | Discharge: 2022-12-22 | Disposition: A | Discharge status: Home / Self Care | Payer: 59 | Source: Ambulatory Visit | Attending: Cardiology | Admitting: Cardiology

## 2022-12-22 VITALS — Ht 70.5 in | Wt 224.9 lb

## 2022-12-22 DIAGNOSIS — Z941 Heart transplant status: Secondary | ICD-10-CM | POA: Diagnosis not present

## 2022-12-22 NOTE — Progress Notes (Signed)
Discharge Progress Report  Patient Details  Name: Thomas Wood MRN: [id number] Date of Birth: 1995-09-19 Referring Provider:   Flowsheet Row INTENSIVE CARDIAC REHAB ORIENT from 09/25/2022 in [facility]  Referring Provider Dr Cheral Bay MD/ Dr Armanda Magic MD, Coveriing        Number of Visits: 37  Reason for Discharge:  Patient reached a stable level of exercise. Patient independent in their exercise. Patient has met program and personal goals.  Smoking History:  Social History   Tobacco Use  Smoking Status Former   Types: Cigarettes   Quit date: 2018   Years since quitting: 6.3  Smokeless Tobacco Never    Diagnosis:  07/18/22 S/P orthotopic heart transplant at [facility]  ADL UCSD:   Initial Exercise Prescription:  Initial Exercise Prescription - 09/25/22 1200       Date of Initial Exercise RX and Referring Provider   Date 09/25/22    Referring Provider Dr Cheral Bay MD/ Dr Armanda Magic MD, Coveriing    Expected Discharge Date 11/24/22      Bike   Level 2    Watts 70    Minutes 15    METs 3      Recumbant Elliptical   Level 2    RPM 65    Minutes 15    METs 3      Prescription Details   Frequency (times per week) 3    Duration Progress to 30 minutes of continuous aerobic without signs/symptoms of physical distress      Intensity   THRR 40-80% of Max Heartrate 78-156    Ratings of Perceived Exertion 11-13    Perceived Dyspnea 0-4      Progression   Progression Continue to progress workloads to maintain intensity without signs/symptoms of physical distress.      Resistance Training   Training Prescription Yes    Weight 4    Reps 10-15             Discharge Exercise Prescription (Final Exercise Prescription Changes):  Exercise Prescription Changes - 12/22/22 1500       Response to Exercise   Blood Pressure (Admit) 108/66    Blood Pressure (Exercise) 124/68     Blood Pressure (Exit) 100/68    Heart Rate (Admit) 100 bpm    Heart Rate (Exercise) 140 bpm    Heart Rate (Exit) 111 bpm    Rating of Perceived Exertion (Exercise) 12    Symptoms None    Comments Pt graduated from the CRP2 program today    Duration Continue with 30 min of aerobic exercise without signs/symptoms of physical distress.    Intensity THRR unchanged      Progression   Progression Continue to progress workloads to maintain intensity without signs/symptoms of physical distress.    Average METs 4.8      Resistance Training   Training Prescription Yes    Weight 5 lbs    Reps 10-15    Time 10 Minutes      Interval Training   Interval Training No      Bike   Level 4    Watts 84    Minutes 15    METs 4.8      Recumbant Elliptical   Level 6    RPM 69    Watts 130    Minutes 15    METs 4.7      Home Exercise Plan  Plans to continue exercise at Home (comment)    Frequency Add 2 additional days to program exercise sessions.    Initial Home Exercises Provided 12/12/22             Functional Capacity:  6 Minute Walk     Row Name 09/25/22 1219 12/19/22 1250       6 Minute Walk   Phase Initial Discharge    Distance 1145 feet 1707 feet    Distance % Change -- 49.08 %    Distance Feet Change -- 562 ft    Walk Time 6 minutes 6 minutes    # of Rest Breaks 0 0    MPH 2.17 3.24    METS 3.79 5.9    RPE 11 11    Perceived Dyspnea  0 1    VO2 Peak 13.27 20.6    Symptoms No No    Resting HR 88 bpm 97 bpm    Resting BP 104/82 110/78    Resting Oxygen Saturation  98 % --    Exercise Oxygen Saturation  during 6 min walk 98 % --    Max Ex. HR 105 bpm 107 bpm    Max Ex. BP 111/71 124/80    2 Minute Post BP 117/81 --             Psychological, QOL, Others - Outcomes: PHQ 2/9:    12/22/2022    2:06 PM 09/25/2022   12:19 PM 08/12/2021    4:02 PM 09/18/2017    2:45 PM 08/11/2016    9:05 AM  Depression screen PHQ 2/9  Decreased Interest 0 0 0 0 0   Down, Depressed, Hopeless 0 0 0 0 0  PHQ - 2 Score 0 0 0 0 0  Altered sleeping 0 0     Tired, decreased energy 0 0     Change in appetite 0 0     Feeling bad or failure about yourself  0 0     Trouble concentrating 0 0     Moving slowly or fidgety/restless 0 0     Suicidal thoughts 0 0     PHQ-9 Score 0 0     Difficult doing work/chores Not difficult at all        Quality of Life:  Quality of Life - 12/22/22 1500       Quality of Life Scores   Health/Function Pre 26.8 %    Health/Function Post 28.4 %    Health/Function % Change 5.97 %    Socioeconomic Pre 20.25 %    Socioeconomic Post 28.5 %    Socioeconomic % Change  40.74 %    Psych/Spiritual Pre 24.86 %    Psych/Spiritual Post 28.29 %    Psych/Spiritual % Change 13.8 %    Family Pre 26.24 %    Family Post 27.6 %    Family % Change 5.18 %    GLOBAL Pre 24.86 %    GLOBAL Post 28.29 %    GLOBAL % Change 13.8 %             Personal Goals: Goals established at orientation with interventions provided to work toward goal.  Personal Goals and Risk Factors at Admission - 09/25/22 1225       Core Components/Risk Factors/Patient Goals on Admission    Weight Management Yes;Obesity;Weight Loss    Intervention Weight Management: Develop a combined nutrition and exercise program designed to reach desired caloric intake, while maintaining appropriate intake of  nutrient and fiber, sodium and fats, and appropriate energy expenditure required for the weight goal.;Weight Management: Provide education and appropriate resources to help participant work on and attain dietary goals.;Weight Management/Obesity: Establish reasonable short term and long term weight goals.;Obesity: Provide education and appropriate resources to help participant work on and attain dietary goals.    Admit Weight 232 lb 5.8 oz (105.4 kg)    Goal Weight: Long Term 180 lb (81.6 kg)    Expected Outcomes Long Term: Adherence to nutrition and physical  activity/exercise program aimed toward attainment of established weight goal;Short Term: Continue to assess and modify interventions until short term weight is achieved;Weight Loss: Understanding of general recommendations for a balanced deficit meal plan, which promotes 1-2 lb weight loss per week and includes a negative energy balance of [phone] kcal/d;Understanding of distribution of calorie intake throughout the day with the consumption of 4-5 meals/snacks;Understanding recommendations for meals to include 15-35% energy as protein, 25-35% energy from fat, 35-60% energy from carbohydrates, less than 200mg  of dietary cholesterol, 20-35 gm of total fiber daily    Hypertension Yes    Intervention Provide education on lifestyle modifcations including regular physical activity/exercise, weight management, moderate sodium restriction and increased consumption of fresh fruit, vegetables, and low fat dairy, alcohol moderation, and smoking cessation.;Monitor prescription use compliance.    Expected Outcomes Short Term: Continued assessment and intervention until BP is < 140/51mm HG in hypertensive participants. < 130/19mm HG in hypertensive participants with diabetes, heart failure or chronic kidney disease.;Long Term: Maintenance of blood pressure at goal levels.    Lipids Yes    Intervention Provide education and support for participant on nutrition & aerobic/resistive exercise along with prescribed medications to achieve LDL 70mg , HDL >40mg .    Expected Outcomes Short Term: Participant states understanding of desired cholesterol values and is compliant with medications prescribed. Participant is following exercise prescription and nutrition guidelines.;Long Term: Cholesterol controlled with medications as prescribed, with individualized exercise RX and with personalized nutrition plan. Value goals: LDL < 70mg , HDL > 40 mg.              Personal Goals Discharge:  Goals and Risk Factor Review     Row  Name 09/29/22 1738 10/28/22 0931 11/19/22 1658 12/11/22 1732       Core Components/Risk Factors/Patient Goals Review   Personal Goals Review Weight Management/Obesity;Hypertension;Lipids Weight Management/Obesity;Hypertension;Lipids Weight Management/Obesity;Hypertension;Lipids Weight Management/Obesity;Hypertension;Lipids    Review Thomas Wood started intensvie cardiac rehab on 09/29/22 and did well with exercise Thomas Wood is doing well with exercise at [facility] cardiac rehab. Vital signs have been stable. Thomas Wood  continues to  do well with exercise at [facility] cardiac rehab. Vital signs have been stable. Thomas Wood has lost 1.6 kg since starting cardiac rehab Thomas Wood  continues to  do well with exercise at [facility] cardiac rehab. Vital signs have been stable. Thomas Wood has lost 2.4  kg since starting cardiac rehab. Thomas Wood will complete intensive cardiac rehab on 12/19/22    Expected Outcomes Thomas Wood will continue to participate in intesive cardiac rehab for exercise, nutrition and lifestyle modifications Thomas Wood will continue to participate in intesive cardiac rehab for exercise, nutrition and lifestyle modifications Thomas Wood will continue to participate in intesive cardiac rehab for exercise, nutrition and lifestyle modifications Thomas Wood will continue to participate in intesive cardiac rehab for exercise, nutrition and lifestyle modifications             Exercise Goals and Review:  Exercise Goals     Row Name 09/25/22 1322  Exercise Goals   Increase Physical Activity Yes       Intervention Provide advice, education, support and counseling about physical activity/exercise needs.;Develop an individualized exercise prescription for aerobic and resistive training based on initial evaluation findings, risk stratification, comorbidities and participant's personal goals.       Expected Outcomes Long Term: Exercising regularly at least 3-5 days a week.;Short Term: Attend rehab on a regular basis to increase amount of  physical activity.;Long Term: Add in home exercise to make exercise part of routine and to increase amount of physical activity.       Increase Strength and Stamina Yes       Intervention Provide advice, education, support and counseling about physical activity/exercise needs.;Develop an individualized exercise prescription for aerobic and resistive training based on initial evaluation findings, risk stratification, comorbidities and participant's personal goals.       Expected Outcomes Short Term: Increase workloads from initial exercise prescription for resistance, speed, and METs.;Short Term: Perform resistance training exercises routinely during rehab and add in resistance training at home;Long Term: Improve cardiorespiratory fitness, muscular endurance and strength as measured by increased METs and functional capacity ( )       Able to understand and use rate of perceived exertion (RPE) scale Yes       Intervention Provide education and explanation on how to use RPE scale       Expected Outcomes Short Term: Able to use RPE daily in rehab to express subjective intensity level;Long Term:  Able to use RPE to guide intensity level when exercising independently       Knowledge and understanding of Target Heart Rate Range (THRR) Yes       Intervention Provide education and explanation of THRR including how the numbers were predicted and where they are located for reference       Expected Outcomes Short Term: Able to state/look up THRR;Long Term: Able to use THRR to govern intensity when exercising independently;Short Term: Able to use daily as guideline for intensity in rehab       Understanding of Exercise Prescription Yes       Intervention Provide education, explanation, and written materials on patient's individual exercise prescription       Expected Outcomes Short Term: Able to explain program exercise prescription;Long Term: Able to explain home exercise prescription to exercise independently                 Exercise Goals Re-Evaluation:  Exercise Goals Re-Evaluation     Row Name 09/29/22 1440 10/24/22 1524 11/14/22 1500 12/10/22 1500 12/22/22 1500     Exercise Goal Re-Evaluation   Exercise Goals Review Increase Physical Activity;Increase Strength and Stamina;Able to understand and use rate of perceived exertion (RPE) scale;Knowledge and understanding of Target Heart Rate Range (THRR);Understanding of Exercise Prescription Increase Physical Activity;Increase Strength and Stamina;Able to understand and use rate of perceived exertion (RPE) scale;Knowledge and understanding of Target Heart Rate Range (THRR);Understanding of Exercise Prescription Increase Physical Activity;Increase Strength and Stamina;Able to understand and use rate of perceived exertion (RPE) scale;Knowledge and understanding of Target Heart Rate Range (THRR);Understanding of Exercise Prescription Increase Physical Activity;Increase Strength and Stamina;Able to understand and use rate of perceived exertion (RPE) scale;Knowledge and understanding of Target Heart Rate Range (THRR);Understanding of Exercise Prescription Increase Physical Activity;Increase Strength and Stamina;Able to understand and use rate of perceived exertion (RPE) scale;Knowledge and understanding of Target Heart Rate Range (THRR);Understanding of Exercise Prescription   Comments Pt's first day in the CRP2 program. Pt understnads  the exercise RX, RPE scale and THRR. Reviewed goals with patient. Pt voices that his stamina has improved since beginning the program. Peak METs are 5.7. Weight loss is a goal, but has lost 1 kg. Reviewed METs and goals. Pt voices he is achieving his goal of increased stamina. Pt voices feeling stonger. Pt halos voices that he has gained the confidence to exercise alone, Reviewed METs and goals. Continues to progress. Has been out the last 2 weeks due to kidney stone. Continues to feel stonger. Pt graduated from the CRP2 program. Pt  will continue to exercise at home on his stationary bike and go to the gym for weight training. Pt has been using his stationary bike at home 2x/week for 30 minutes in addtion to the CRP2 program.   Expected Outcomes Will continue to montior patient and progress exercise workloads as tolerated. Will continue to montior patient and progress exercise workloads as tolerated. Will continue to montior patient and progress exercise workloads as tolerated. Will continue to montior patient and progress exercise workloads as tolerated. Pt will continue exercise on his own at [facility] and his gym.            Nutrition & Weight - Outcomes:  Pre Biometrics - 09/25/22 1216       Pre Biometrics   Waist Circumference 44.5 inches    Hip Circumference 47.25 inches    Waist to Hip Ratio 0.94 %    Triceps Skinfold 24 mm    % Body Fat 31.8 %    Grip Strength 46 kg    Flexibility 15.75 in    Single Leg Stand 30 seconds             Post Biometrics - 12/22/22 1345        Post  Biometrics   Height 5' 10.5" (1.791 m)    Weight 102 kg    Waist Circumference 42 inches    Hip Circumference 46.5 inches    Waist to Hip Ratio 0.9 %    BMI (Calculated) 31.8    Triceps Skinfold 24 mm    % Body Fat 30.1 %    Grip Strength 54 kg    Flexibility 17 in    Single Leg Stand 30 seconds             Nutrition:  Nutrition Therapy & Goals - 12/05/22 1333       Nutrition Therapy   Diet Heart Healthy Diet      Personal Nutrition Goals   Nutrition Goal Patient to identify strategies for reducing cardiovascular risk by attending the [employer] education and nutrition series weekly.    Personal Goal #2 Patient to improve diet quality by using the plate method as a daily guide for meal planning to including lean protein/plant protein, fruits, vegetables, whole gains and nonfat dairy.    Personal Goal #3 Patient to identify strategies for weight loss of 0.5-2.0#.    Comments Thomas Wood has not attended [facility] since  11/21/22. Prior to this, Thomas Wood reported that he has drastically reduced fried foods and he was cooking more at home. Per last documentated weight, Thomas Wood is down 3# since starting with our program. He continues mindfulness of sodium intake, saturated fat intake, and fiber intake. His wife was recently diagnosed a multiple food allergies that they are navigating. He will benefit from participation in intensive cardiac rehab for nutrition, exercise and lifestyle modification.      Intervention Plan   Intervention Prescribe, educate and counsel regarding individualized  specific dietary modifications aiming towards targeted core components such as weight, hypertension, lipid management, diabetes, heart failure and other comorbidities.;Nutrition handout(s) given to patient.    Expected Outcomes Short Term Goal: Understand basic principles of dietary content, such as calories, fat, sodium, cholesterol and nutrients.;Long Term Goal: Adherence to prescribed nutrition plan.             Nutrition Discharge:  Nutrition Assessments - 12/23/22 1523       Rate Your Plate Scores   Post Score 55             Education Questionnaire Score:  Knowledge Questionnaire Score - 12/22/22 1500       Knowledge Questionnaire Score   Post Score 24/24             Goals reviewed with patient; copy given to patient.Pt graduates from  Intensive cardiac rehab program on 12/22/22  with completion of  27 exercise and  10 education sessions. Pt maintained good attendance and progressed nicely during their participation in rehab as evidenced by increased MET level.   Medication list reconciled. Repeat  PHQ score- 0 .  Pt has made significant lifestyle changes and should be commended for their success. Thomas Wood lost 3.4 kg and increased his distance on his post exercise walk test by 562 feet.  Thomas Wood  achieved his  goals during cardiac rehab.   Pt plans to continue exercise at home using his stationary bike and going to the  gym for weight training. Thomas Wood says the program has been beneficial for him and he feels stronger. We are proud of Thomas Wood's progress!Thayer Headings RN BSN

## 2023-02-11 IMAGING — CR DG CHEST 2V
2 series · 2 of 2 positions shown · non-contrast
Comparison: None.

CLINICAL DATA: Shortness of breath for 2 days. Nonproductive cough.
Former smoker.

EXAM:
CHEST - 2 VIEW

[w chest pa]
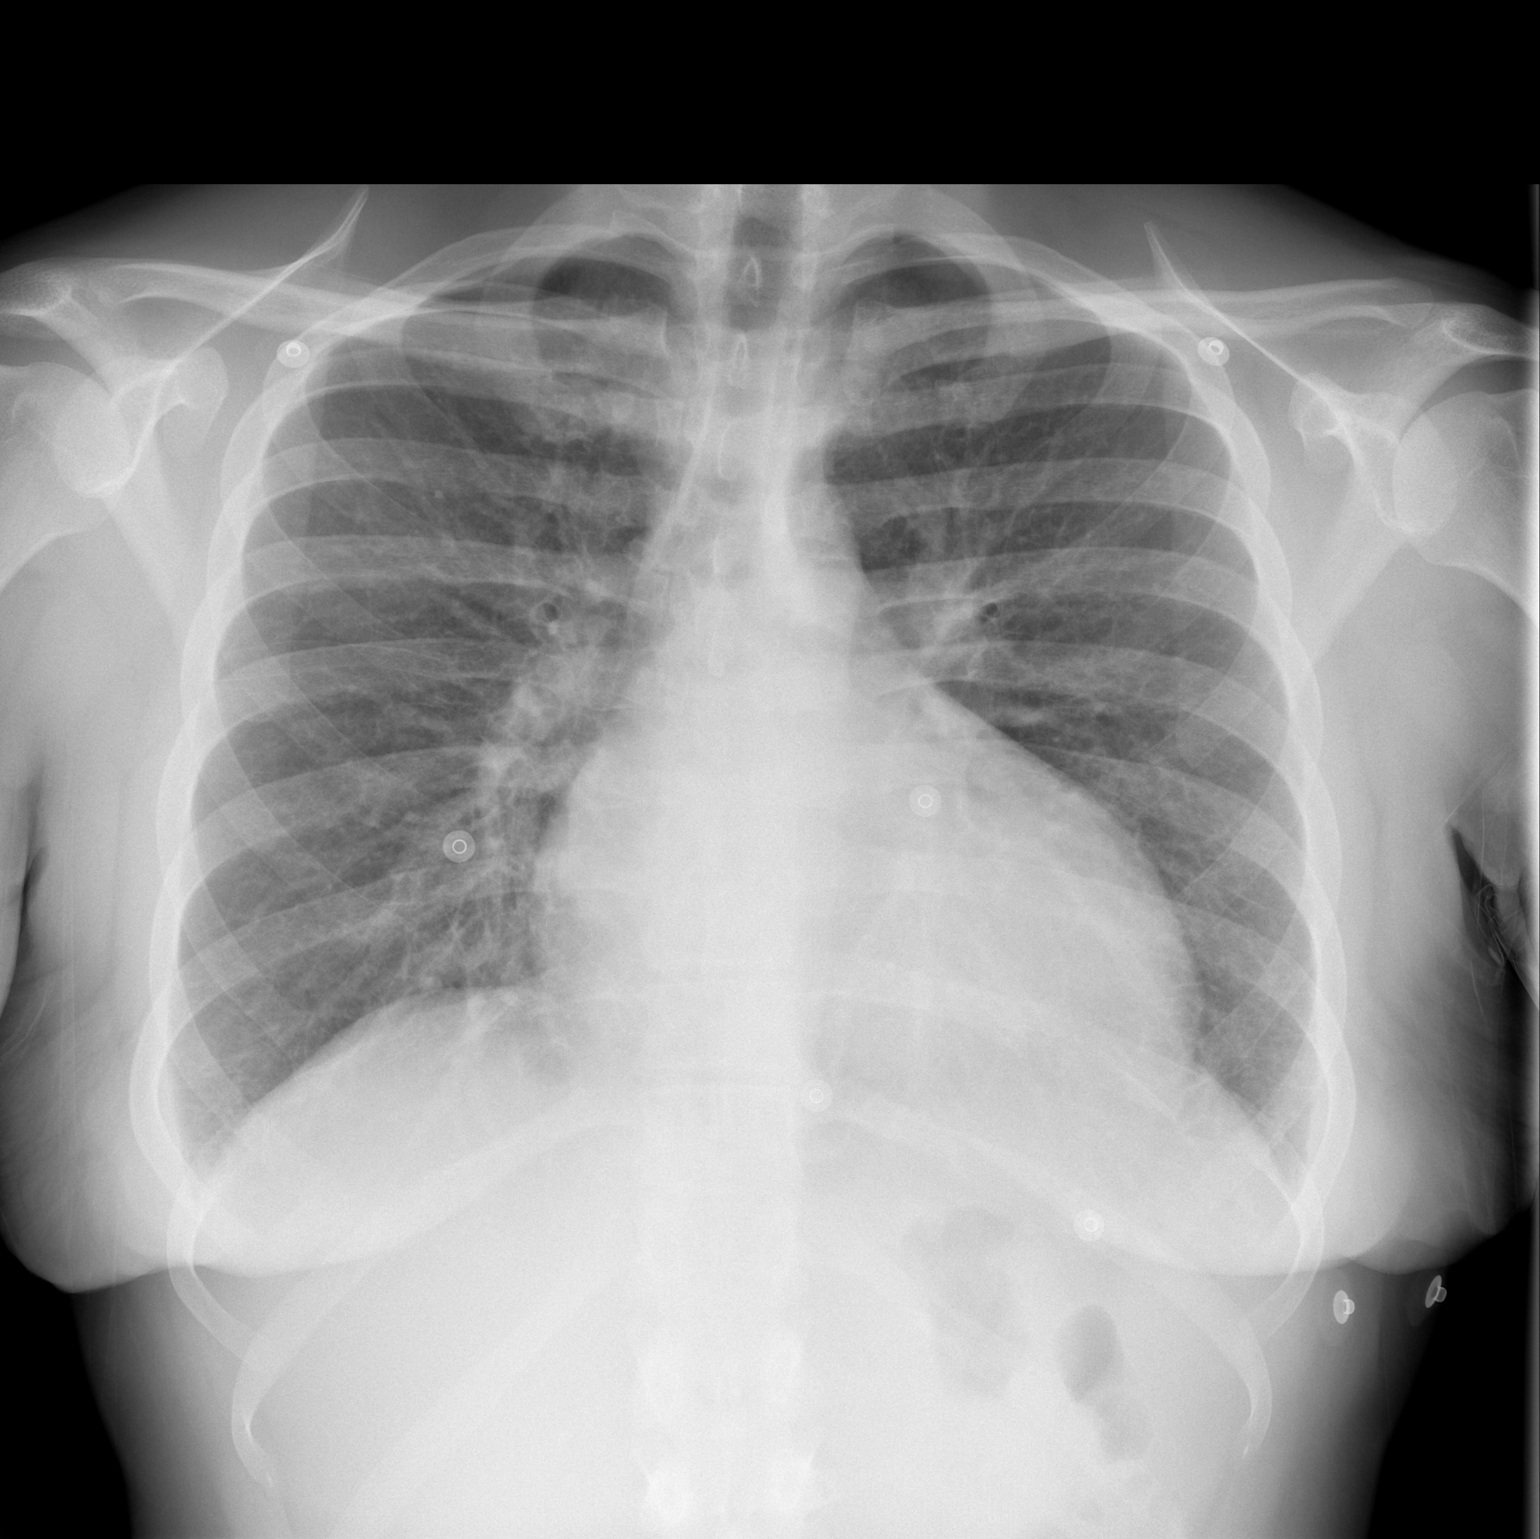

[w chest lat]
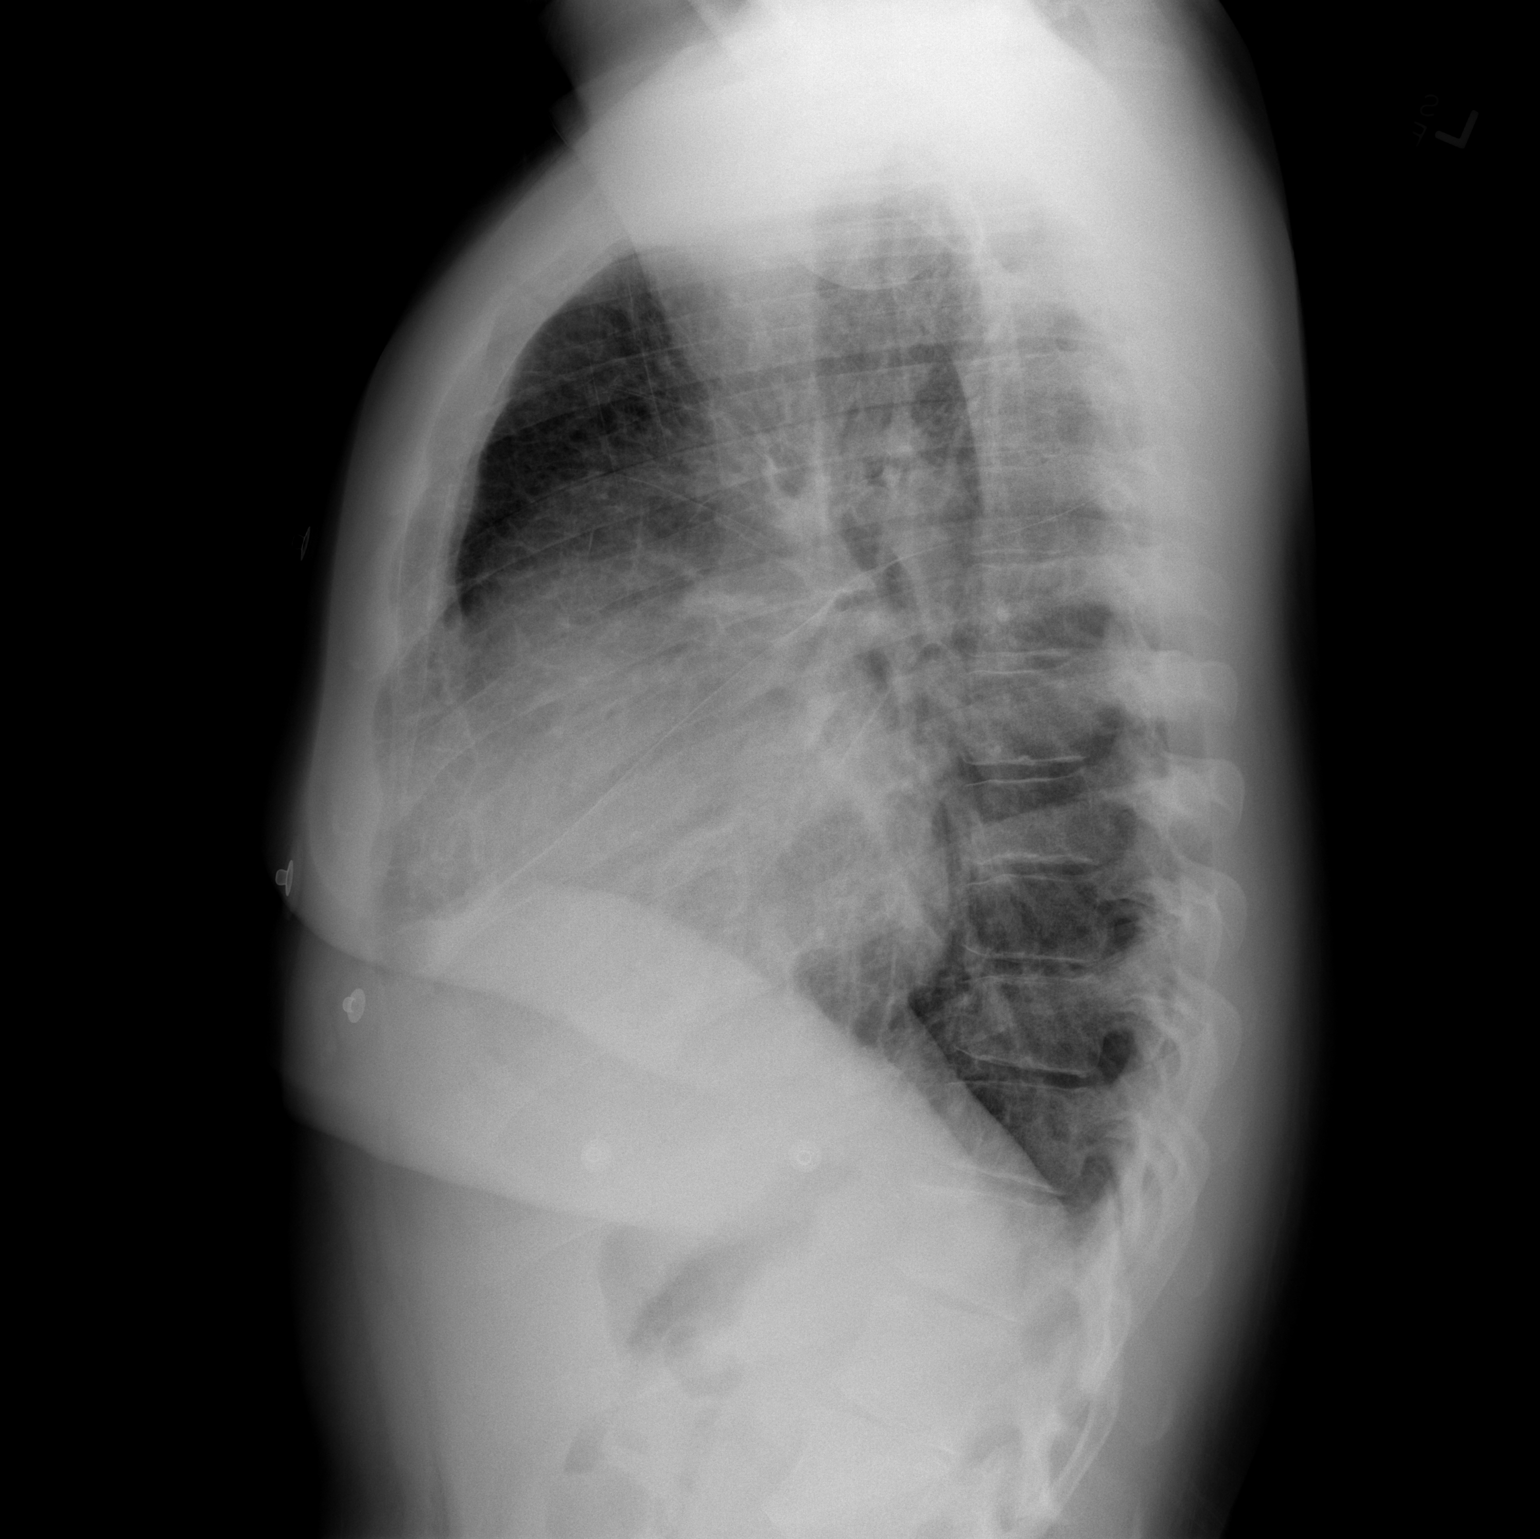

[2 of 2 positions shown; findings below may reference images not displayed]

FINDINGS: The cardiac silhouette is mildly enlarged. There is peribronchial
thickening bilaterally. No confluent airspace opacity, overt
pulmonary edema, pleural effusion, or pneumothorax is identified. No
acute osseous abnormality is seen.
IMPRESSION: Bronchitic changes and mild cardiomegaly.

## 2023-02-15 IMAGING — MR MR CARD MORPHOLOGY WO/W CM
45 of 48 series · 45 of 48 positions shown · IV contrast (gadavist)
Comparison: none

CLINICAL DATA: Clinical question of cardiomyopathy
Study assumes HCT of 43 and BSA of 2 cm2.

EXAM:
CARDIAC MRI
TECHNIQUE: The patient was scanned on a 1.5 Tesla GE magnet. A dedicated
cardiac coil was used. Functional imaging was done using Fiesta
sequences. [DATE], and 4 chamber views were done to assess for RWMA's.
Modified Fallon rule using a short axis stack was used to
calculate an ejection fraction on a dedicated work station using
Circle software. The patient received 8 cc of Gadavist. After 10
minutes inversion recovery sequences were used to assess for
infiltration and scar tissue.
CONTRAST:  8 cc  of Gadavist

[Series 49: [id number]_bh · axial · 8.0mm · 1.41mm/px · 1 of 16 slices shown]
[im 1/16]
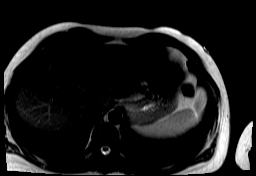

[Series 53: bSSFP · oblique · 8.0mm · 1.61mm/px · 1 of 9 slices shown (1 of 21)]
[im 1/9]
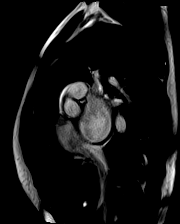

[Series 54: bSSFP · oblique · 8.0mm · 1.61mm/px · 1 of 25 slices shown (2 of 21)]
[im 1/25]
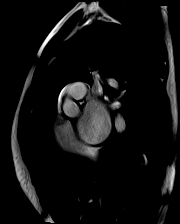

[Series 55: bSSFP · oblique · 8.0mm · 1.61mm/px · 1 of 15 slices shown (3 of 21)]
[im 1/15]
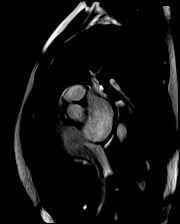

[Series 56: bSSFP · oblique · 8.0mm · 1.61mm/px · 1 of 15 slices shown (4 of 21)]
[im 1/15]
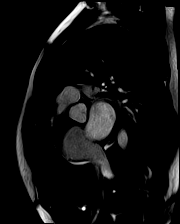

[Series 57: bSSFP · oblique · 8.0mm · 1.61mm/px · 1 of 15 slices shown (5 of 21)]
[im 1/15]
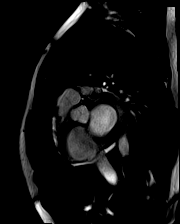

[Series 58: bSSFP · oblique · 8.0mm · 1.61mm/px · 1 of 15 slices shown (6 of 21)]
[im 1/15]
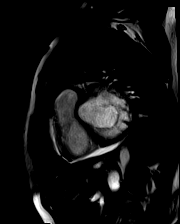

[Series 59: bSSFP · oblique · 8.0mm · 1.61mm/px · 1 of 15 slices shown (7 of 21)]
[im 1/15]
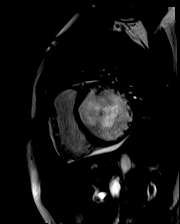

[Series 60: bSSFP · oblique · 8.0mm · 1.61mm/px · 1 of 15 slices shown (8 of 21)]
[im 1/15]
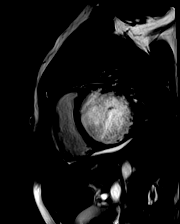

[Series 61: bSSFP · oblique · 8.0mm · 1.61mm/px · 1 of 15 slices shown (9 of 21)]
[im 1/15]
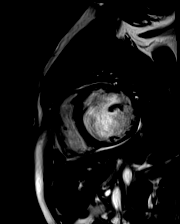

[Series 62: bSSFP · oblique · 8.0mm · 1.61mm/px · 1 of 15 slices shown (10 of 21)]
[im 1/15]
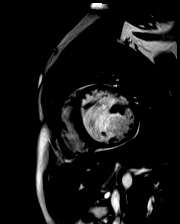

[Series 63: bSSFP · oblique · 8.0mm · 1.61mm/px · 1 of 15 slices shown (11 of 21)]
[im 1/15]
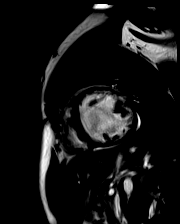

[Series 64: bSSFP · oblique · 8.0mm · 1.61mm/px · 1 of 14 slices shown (12 of 21)]
[im 1/14]
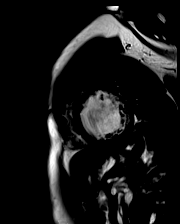

[Series 65: bSSFP · oblique · 8.0mm · 1.61mm/px · 1 of 15 slices shown (13 of 21)]
[im 1/15]
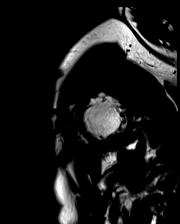

[Series 66: bSSFP · oblique · 8.0mm · 1.61mm/px · 1 of 15 slices shown (14 of 21)]
[im 1/15]
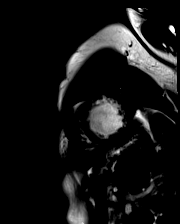

[Series 67: bSSFP · oblique · 8.0mm · 1.61mm/px · 1 of 15 slices shown (15 of 21)]
[im 1/15]
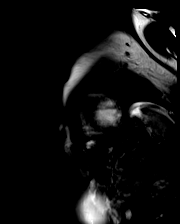

[Series 68: bSSFP · oblique · 8.0mm · 1.61mm/px · 1 of 15 slices shown (16 of 21)]
[im 1/15]
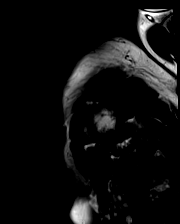

[Series 69: bSSFP · oblique · 8.0mm · 1.61mm/px · 1 of 15 slices shown (17 of 21)]
[im 1/15]
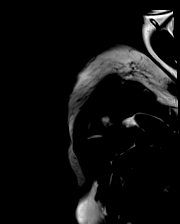

[Series 70: bSSFP · oblique · 8.0mm · 1.61mm/px · 1 of 15 slices shown (18 of 21)]
[im 1/15]
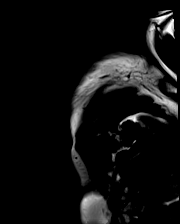

[Series 71: cine_trufi_cs_rt_short axis · oblique · 8.0mm · 1.73mm/px · 1 of 11 slices shown (1 of 20)]
[im 1/11]
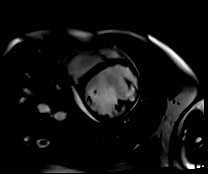

[Series 71: cine_trufi_cs_rt_short axis · oblique · 8.0mm · 1.73mm/px · 1 of 11 slices shown (2 of 20)]
[im 1/11]
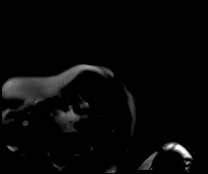

[Series 71: cine_trufi_cs_rt_short axis · oblique · 8.0mm · 1.73mm/px · 1 of 11 slices shown (3 of 20)]
[im 1/11]
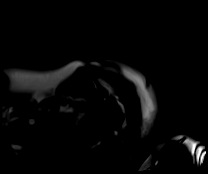

[Series 71: cine_trufi_cs_rt_short axis · oblique · 8.0mm · 1.73mm/px · 1 of 11 slices shown (4 of 20)]
[im 1/11]
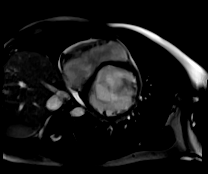

[Series 71: cine_trufi_cs_rt_short axis · oblique · 8.0mm · 1.73mm/px · 1 of 11 slices shown (5 of 20)]
[im 1/11]
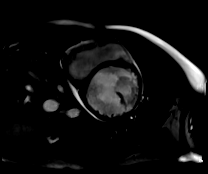

[Series 71: cine_trufi_cs_rt_short axis · oblique · 8.0mm · 1.73mm/px · 1 of 11 slices shown (6 of 20)]
[im 1/11]
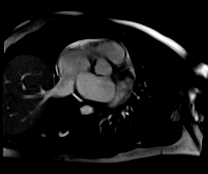

[Series 71: cine_trufi_cs_rt_short axis · oblique · 8.0mm · 1.73mm/px · 1 of 11 slices shown (7 of 20)]
[im 1/11]
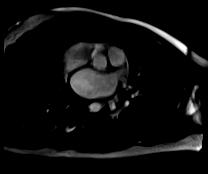

[Series 71: cine_trufi_cs_rt_short axis · oblique · 8.0mm · 1.73mm/px · 1 of 11 slices shown (8 of 20)]
[im 1/11]
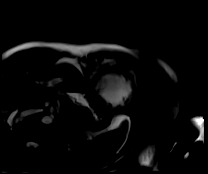

[Series 71: cine_trufi_cs_rt_short axis · oblique · 8.0mm · 1.73mm/px · 1 of 11 slices shown (9 of 20)]
[im 1/11]
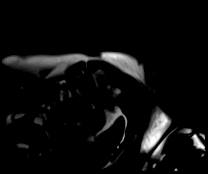

[Series 71: cine_trufi_cs_rt_short axis · oblique · 8.0mm · 1.73mm/px · 1 of 11 slices shown (10 of 20)]
[im 1/11]
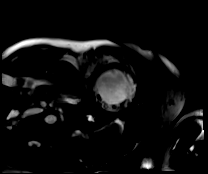

[Series 71: cine_trufi_cs_rt_short axis · oblique · 8.0mm · 1.73mm/px · 1 of 11 slices shown (11 of 20)]
[im 1/11]
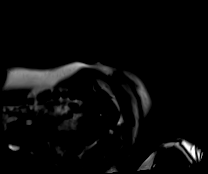

[Series 71: cine_trufi_cs_rt_short axis · oblique · 8.0mm · 1.73mm/px · 1 of 11 slices shown (12 of 20)]
[im 1/11]
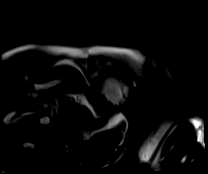

[Series 71: cine_trufi_cs_rt_short axis · oblique · 8.0mm · 1.73mm/px · 1 of 11 slices shown (13 of 20)]
[im 1/11]
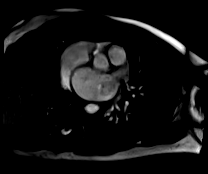

[Series 71: cine_trufi_cs_rt_short axis · oblique · 8.0mm · 1.73mm/px · 1 of 11 slices shown (14 of 20)]
[im 1/11]
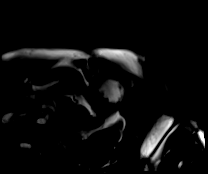

[Series 71: cine_trufi_cs_rt_short axis · oblique · 8.0mm · 1.73mm/px · 1 of 11 slices shown (15 of 20)]
[im 1/11]
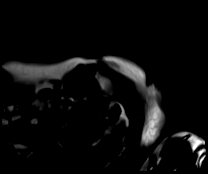

[Series 71: cine_trufi_cs_rt_short axis · oblique · 8.0mm · 1.73mm/px · 1 of 11 slices shown (16 of 20)]
[im 1/11]
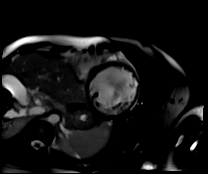

[Series 71: cine_trufi_cs_rt_short axis · oblique · 8.0mm · 1.73mm/px · 1 of 11 slices shown (17 of 20)]
[im 1/11]
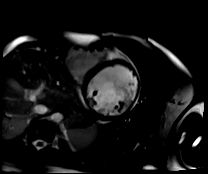

[Series 71: cine_trufi_cs_rt_short axis · oblique · 8.0mm · 1.73mm/px · 1 of 11 slices shown (18 of 20)]
[im 1/11]
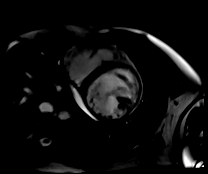

[Series 71: cine_trufi_cs_rt_short axis · oblique · 8.0mm · 1.73mm/px · 1 of 11 slices shown (19 of 20)]
[im 1/11]
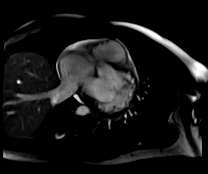

[Series 71: cine_trufi_cs_rt_short axis · oblique · 8.0mm · 1.73mm/px · 1 of 11 slices shown (20 of 20)]
[im 1/11]
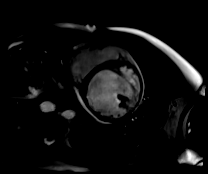

[Series 72: bSSFP · oblique · 6.0mm · 1.41mm/px · 1 of 7 slices shown (19 of 21)]
[im 1/7]
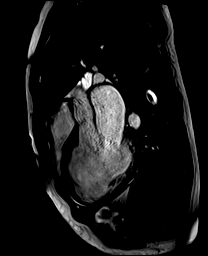

[Series 73: bSSFP · coronal · 6.0mm · 1.41mm/px · 1 of 9 slices shown (20 of 21)]
[im 1/9]
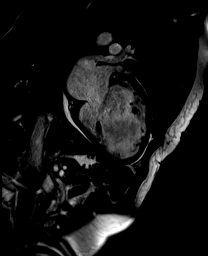

[Series 74: bSSFP · oblique · 6.0mm · 1.41mm/px · 1 of 15 slices shown (21 of 21)]
[im 1/15]
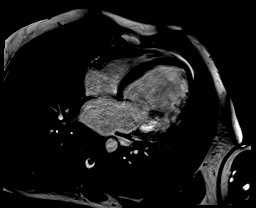

[Series 75: (id)_trufi · oblique · 8.0mm · 2.08mm/px · 1 of 9 slices shown]
[im 1/9]
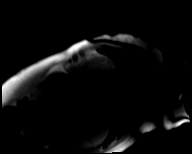

[Series 76: (id)_trufi_moco · oblique · 8.0mm · 2.08mm/px · 1 of 9 slices shown]
[im 1/9]
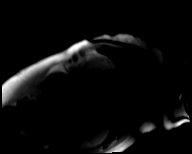

[Series 79: (id)_long_t1 · oblique · 8.0mm · 1.56mm/px · 1 of 24 slices shown]
[im 1/24]
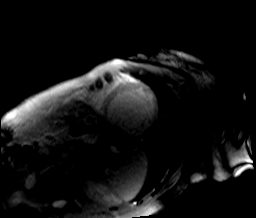

[45 of 48 positions shown; findings below may reference images not displayed]

FINDINGS: 1. Severely dilated left ventricular size, with LVEDD 89 mm, and
LVEDVi 220 mL/m2.

Myocardial thinning, with left ventricular thickness, with
intraventricular septal thickness of 6 mm, posterior wall thickness
of 5 mm, and myocardial mass index of 75 g/m2.

Severely reduced left ventricular systolic function (LVEF =9%).
There is inferior and lateral akinesis in the base, mid, and apex.
All other segments are severely hypokinetic.

Left ventricular parametric mapping notable for diffusely elevated
ECV signal (30-45%) with normal T2 signal.

There is late gadolinium enhancement in the left ventricular
myocardium: Thinning and transmural LGE in the inferior and lateral
akinesis in the base, mid, and apex.

There is no evidence of LV thrombus.

2.  Mildly increased right ventricular size with RVEDVI 120 mL/m2.

Normal right ventricular thickness.

Severely reduced right ventricular systolic function. There are no
regional wall motion abnormalities or aneurysms.

3.  Normal right atrial size with moderate left atrial enlargement.

4.  Normal size of the aortic root, ascending aorta.

Dilation of the main pulmonary artery, moderate, at 33 mm.

5. Given ECG triggering problems related to severe biventricular
function with ectopy, valves are not well assessed in this study.

6.  Normal pericardium.  Small inferior pericardial effusion.

7. Grossly, no extracardiac findings. Recommended dedicated study if
concerned for non-cardiac pathology.

8. Breath-hold Artifacts noted. This decreased the sensitivity of
late gadolinium enhancement.

Absolute volumes:

LV EDV: mL (Normal 52-141 mL)

LV ESV: mL (Normal 13-51 mL)

LV SV: mL (Normal 33-97 mL)

CO: L/min (Normal 2.7-6.0 L/min)

Indexed volumes:

LV EDV: mL/sq-m (Normal 41-81 mL/sq-m)

LV ESV: mL/sq-m (Normal 12-21 mL/sq-m)

LV SV: mL/sq-m (Normal 26-56 mL/sq-m)

CI: L/min/sq-m (Normal 1.8-3.8 L/min/sq-m)

Absolute volumes:

LV EDV: mL (Normal 77-195 mL)

LV ESV: mL (Normal 19-72 mL)

LV SV: mL (Normal 51-133 mL)

CO: L/min (Normal 2.8-8.8 L/min)

Indexed volumes:

LV EDV: mL/sq-m (Normal 47-92 mL/sq-m)

LV ESV: mL/sq-m (Normal 13-30 mL/sq-m)

LV SV: mL/sq-m (Normal 32-62 mL/sq-m)

CI: L/min/sq-m (Normal 1.7-4.2 L/min/sq-m)

Right ventricle:

Absolute volumes:

RV EDV: mL (Normal 58-154 mL)

RV ESV: mL (Normal 12-68 mL)

RV SV: mL (Normal 35-98 mL)

CO: L/min (Normal 2.7-6 L/min)

Indexed volumes:

RV EDV: mL/sq-m (Normal 48-87 mL/sq-m)

RV ESV: mL/sq-m (Normal 11-28 mL/sq-m)

RV SV: mL/sq-m (Normal 27-57 mL/sq-m)

CI: L/min/sq-m (Normal 1.8-3.8 L/min/sq-m)

Absolute volumes:

RV EDV: mL (Normal 88-227 mL)

RV ESV: mL (Normal 23-103 mL)

RV SV: mL (Normal 52-138 mL)

CO: L/min (Normal 2.8-8.8 L/min)
IMPRESSION: Severe biventricular dysfunction with severe LV dilation and
globally elevated ECV signal. Myocardial thinning and scar
consistent with a chronic process.

Study is consistent with dilated cardiomyopathy

## 2023-04-07 ENCOUNTER — Ambulatory Visit (INDEPENDENT_AMBULATORY_CARE_PROVIDER_SITE_OTHER): Payer: 59 | Admitting: Family Medicine

## 2023-04-07 ENCOUNTER — Encounter: Payer: Self-pay | Admitting: Family Medicine

## 2023-04-07 VITALS — BP 124/70 | HR 114 | Temp 98.1°F | Ht 70.5 in | Wt 207.6 lb

## 2023-04-07 DIAGNOSIS — Z941 Heart transplant status: Secondary | ICD-10-CM | POA: Diagnosis not present

## 2023-04-07 DIAGNOSIS — I10 Essential (primary) hypertension: Secondary | ICD-10-CM | POA: Diagnosis not present

## 2023-04-07 DIAGNOSIS — Z9225 Personal history of immunosupression therapy: Secondary | ICD-10-CM | POA: Diagnosis not present

## 2023-04-07 NOTE — Assessment & Plan Note (Signed)
Continue mycophenolate, tacrolimus, and prednisone.

## 2023-04-07 NOTE — Assessment & Plan Note (Signed)
Blood pressure is at goal today. Continue amlodipine 5 mg daily.

## 2023-04-07 NOTE — Progress Notes (Signed)
[facility] [address] [place] [address] Dept: [phone] Dept Fax: [phone]  Chronic Care Office Visit  Subjective:    Patient ID: Thomas Wood, male    DOB: [birth year], 27 y.o..   MRN: [id number]  Chief Complaint  Patient presents with   Follow-up    6 month f/u.   No concerns.    History of Present Illness:  Patient is in today for reassessment of chronic medical issues.  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 He underwent a heart transplant at [facility] in Nov 2023. He has completed his cardiac rehab and been released to do what physical activity he feels comfortable with. He is managed on tacrolimus and mycophenolate to prevent rejection. He has been receiving periodic heart biopsies to monitor for rejection and all has been good so far.    Mr. Mubarak has hypertension. He is currently managed on amlodipine 5 mg daily. He has had some mild CKD in the past. He is having regular checks on his kidney function.  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   Mild intermittent asthma 09/30/2021   Low HDL (under 40) 09/18/2017   Environmental allergies 08/11/2016   History of kidney stones 08/11/2016   Past Surgical History:  Procedure Laterality Date   07/18/22 S/P Heart Transplant at [facility]  07/18/2022   ARTERIAL LINE INSERTION N/A 07/12/2021   Procedure: ARTERIAL LINE INSERTION;  Surgeon: Dolores Patty, MD;  [facility] IN[facility];  Service: Cardiovascular;  Laterality: N/A;   CARDIAC CATHETERIZATION      CENTRAL LINE INSERTION  07/12/2021   Procedure: CENTRAL LINE INSERTION;  Surgeon: Dolores Patty, MD;  Location: [facility] INVASIVE CV LAB;  Service: Cardiovascular;;   IR ANGIO INTRA EXTRACRAN SEL COM CAROTID INNOMINATE BILAT MOD SED  07/24/2021   IR ANGIO VERTEBRAL SEL VERTEBRAL BILAT MOD SED  07/24/2021   RADIOLOGY WITH ANESTHESIA N/A 07/24/2021   Procedure: RADIOLOGY WITH ANESTHESIA;  Surgeon: Julieanne Cotton, MD;  Location: [facility] OR;  Service: Radiology;  Laterality: N/A;   RIGHT/LEFT HEART CATH AND CORONARY ANGIOGRAPHY N/A 07/12/2021   Procedure: RIGHT/LEFT HEART CATH AND CORONARY ANGIOGRAPHY;  Surgeon: Dolores Patty, MD;  Location: [facility];  Service: Cardiovascular;  Laterality: N/A;   Family History  Problem Relation Age of Onset   Diabetes Mother    Asthma Mother    Cancer Mother        breast   Miscarriages / Stillbirths Mother    Kidney disease Mother        stones   Heart disease Father 53       MI, heart transplant   Diabetes Father    Heart failure Father        LVAD>>Heart Transplant (42s)   Asthma Brother    Diabetes Maternal Grandmother    Cancer Maternal Grandmother        Breast   Kidney disease Maternal Grandmother    Stroke Maternal Grandmother    Diabetes Paternal Grandmother    Cancer Paternal Grandfather    Outpatient Medications Prior to Visit  Medication Sig Dispense Refill   acetaminophen (TYLENOL) 325 MG tablet Take 325 mg by mouth every 6 (six) hours as needed for moderate pain.     albuterol (VENTOLIN HFA) 108 (90 Base) MCG/ACT inhaler Inhale 2 puffs into the lungs every 6 (six) hours as needed for wheezing or shortness of breath.     amLODipine (NORVASC) 5 MG tablet Take 5 mg by mouth 2 (two) times daily.     aspirin EC 81 MG tablet Take 81 mg by mouth once.     cetirizine (ZYRTEC) 10 MG tablet Take 10 mg by mouth daily as needed (for seasonal allergies).     magnesium oxide (MAG-OX) 400 MG tablet Take 400 mg by mouth 2 (two) times  daily. With lunch and dinner     mycophenolate (CELLCEPT) 250 MG capsule Take 500 mg by mouth every 12 (twelve) hours.     pantoprazole (PROTONIX) 40 MG tablet Take 40 mg by mouth daily.     pravastatin (PRAVACHOL) 40 MG tablet Take 1 tablet by mouth at bedtime.     predniSONE (DELTASONE) 2.5 MG tablet Take 2.5 mg by mouth daily with breakfast.     sertraline (ZOLOFT) 50 MG tablet Take 50 mg by mouth daily.     sulfamethoxazole-trimethoprim (BACTRIM) 400-80 MG tablet Take 1 tablet by mouth daily.     tacrolimus (PROGRAF) 1 MG capsule Take 4 capsules by mouth 2 (two) times daily. 4 in the morning, 3 at night     predniSONE (DELTASONE) 5 MG tablet Take 2 tablets by mouth daily.     Calcium Carb-Cholecalciferol 600-10 MG-MCG TABS Take 600 mg by mouth in the morning and at bedtime. With food     nystatin (MYCOSTATIN) 100000 UNIT/ML suspension Take 5 mLs by mouth 4 (four) times daily.     No facility-administered medications prior to visit.   Allergies  Allergen Reactions   Amiodarone Other (See Comments)    Hepatotoxicity and hyperthyroidism Abnormal LFTs   Gramineae Pollens Other (See Comments)    "Tree pollen" Runny nose, watery eyes, sneezing   Objective:   Today's Vitals   04/07/23 1443  BP: 124/70  Pulse: (!) 114  Temp: 98.1 F (36.7 C)  TempSrc: Temporal  SpO2: 98%  Weight: 207 lb 9.6 oz (94.2 kg)  Height: 5' 10.5" (1.791 m)   Body mass index is 29.37 kg/m.   General: Well developed, well nourished. No acute distress. Psych: Alert and oriented. Normal mood and affect.  Health Maintenance Due  Topic Date Due   HPV VACCINES (3 - Risk male 3-dose series) 05/27/2012      Assessment & Plan:   Problem List Items Addressed This Visit       Cardiovascular and Mediastinum   Essential hypertension - Primary   S/P orthotopic heart transplant (HCC)   Relevant Medications   predniSONE (DELTASONE) 2.5 MG tablet     Other   Personal history of immunosupression therapy     Return in about 6 months (around 10/08/2023).   Loyola Mast, MD

## 2023-04-07 NOTE — Assessment & Plan Note (Addendum)
Doing well at present. Continue to follow with [facility] team.

## 2023-04-08 ENCOUNTER — Ambulatory Visit: Payer: 59 | Admitting: Family Medicine

## 2023-10-09 ENCOUNTER — Encounter: Payer: Self-pay | Admitting: Family Medicine

## 2023-10-09 ENCOUNTER — Ambulatory Visit (INDEPENDENT_AMBULATORY_CARE_PROVIDER_SITE_OTHER): Payer: 59 | Admitting: Family Medicine

## 2023-10-09 VITALS — BP 118/74 | HR 91 | Temp 98.1°F | Ht 70.5 in | Wt 216.4 lb

## 2023-10-09 DIAGNOSIS — N182 Chronic kidney disease, stage 2 (mild): Secondary | ICD-10-CM

## 2023-10-09 DIAGNOSIS — Z941 Heart transplant status: Secondary | ICD-10-CM

## 2023-10-09 DIAGNOSIS — I10 Essential (primary) hypertension: Secondary | ICD-10-CM

## 2023-10-09 NOTE — Assessment & Plan Note (Signed)
 Continue focus on blood pressure control, adequate hydration, and avoidance of nephrotoxic medications.

## 2023-10-09 NOTE — Progress Notes (Signed)
 [facility] [address] [place] [address] Dept: [phone] Dept Fax: [phone]  Chronic Care Office Visit  Subjective:    Patient ID: Thomas Wood, male    DOB: Sep 25, 1995, 28 y.o..   MRN: [id number]  Chief Complaint  Patient presents with   Hypertension    6 month f/u HTN.    History of Present Illness:  Patient is in today for reassessment of chronic medical issues.  Thomas Wood has a history of HFrEF and VF. He underwent a heart transplant at [facility] in Nov 2023. He is managed on tacrolimus 6 mg daily and mycophenolate 360 mg daily to prevent rejection. He has been receiving periodic heart biopsies to monitor for rejection and all has been good so far. However, he notes his transplant doctor did recently suggest they repeat his biopsy. Thomas Wood notes an occasional skip beat, but denies any lower leg edema or dyspnea.   Thomas Wood has hypertension. He is currently managed on amlodipine 5 mg daily. He has had some mild CKD in the past. He is having regular checks on his kidney function.  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   Mild intermittent asthma 09/30/2021   Low HDL (under 40) 09/18/2017   Environmental allergies 08/11/2016   History of kidney stones 08/11/2016   Past Surgical History:  Procedure Laterality Date   07/18/22 S/P Heart Transplant at [facility]  07/18/2022   ARTERIAL LINE INSERTION N/A 07/12/2021   Procedure: ARTERIAL LINE INSERTION;  Surgeon: Cherrie Toribio SAUNDERS, MD;  [facility];   Service: Cardiovascular;  Laterality: N/A;   CARDIAC CATHETERIZATION     CENTRAL LINE INSERTION  07/12/2021   Procedure: CENTRAL LINE INSERTION;  Surgeon: Cherrie Toribio SAUNDERS, MD;  Location: [facility];  Service: Cardiovascular;;   IR ANGIO INTRA EXTRACRAN SEL COM CAROTID INNOMINATE BILAT MOD SED  07/24/2021   IR ANGIO VERTEBRAL SEL VERTEBRAL BILAT MOD SED  07/24/2021   RADIOLOGY WITH ANESTHESIA N/A 07/24/2021   Procedure: RADIOLOGY WITH ANESTHESIA;  Surgeon: Dolphus Carrion, MD;  Location: [facility] OR;  Service: Radiology;  Laterality: N/A;   RIGHT/LEFT HEART CATH AND CORONARY ANGIOGRAPHY N/A 07/12/2021   Procedure: RIGHT/LEFT HEART CATH AND CORONARY ANGIOGRAPHY;  Surgeon: Cherrie Toribio SAUNDERS, MD;  Location: [facility];  Service: Cardiovascular;  Laterality: N/A;   Family History  Problem Relation Age of Onset   Diabetes Mother    Asthma Mother    Cancer Mother        breast   Miscarriages / Stillbirths Mother    Kidney disease Mother        stones   Heart disease Father 20       MI, heart transplant   Diabetes Father    Heart failure Father        LVAD>>Heart Transplant (63s)   Asthma Brother    Diabetes Maternal Grandmother    Cancer Maternal Grandmother        Breast   Kidney disease Maternal Grandmother    Stroke Maternal Grandmother    Diabetes Paternal Grandmother  Cancer Paternal Grandfather    Outpatient Medications Prior to Visit  Medication Sig Dispense Refill   acetaminophen  (TYLENOL ) 325 MG tablet Take 325 mg by mouth every 6 (six) hours as needed for moderate pain.     albuterol  (VENTOLIN  HFA) 108 (90 Base) MCG/ACT inhaler Inhale 2 puffs into the lungs every 6 (six) hours as needed for wheezing or shortness of breath.     amLODipine (NORVASC) 5 MG tablet Take 5 mg by mouth daily.     cetirizine (ZYRTEC) 10 MG tablet Take 10 mg by mouth daily as needed (for seasonal allergies).     mycophenolate (MYFORTIC) 360 MG TBEC EC tablet Take 360 mg by mouth.      pantoprazole  (PROTONIX ) 40 MG tablet Take 40 mg by mouth daily.     rosuvastatin (CRESTOR) 10 MG tablet Take 10 mg by mouth at bedtime.     sertraline (ZOLOFT) 50 MG tablet Take 1 tablet by mouth daily.     tacrolimus (PROGRAF) 1 MG capsule Take 1 mg by mouth.     tacrolimus (PROGRAF) 5 MG capsule Take 5 mg by mouth.     mycophenolate (CELLCEPT) 250 MG capsule Take 500 mg by mouth every 12 (twelve) hours.     pravastatin (PRAVACHOL) 40 MG tablet Take 1 tablet by mouth at bedtime.     predniSONE (DELTASONE) 2.5 MG tablet Take 2.5 mg by mouth daily with breakfast.     No facility-administered medications prior to visit.   Allergies  Allergen Reactions   Amiodarone  Other (See Comments)    Hepatotoxicity and hyperthyroidism Abnormal LFTs   Gramineae Pollens Other (See Comments)    Tree pollen Runny nose, watery eyes, sneezing   Objective:   Today's Vitals   10/09/23 1343  BP: 118/74  Pulse: 91  Temp: 98.1 F (36.7 C)  TempSrc: Temporal  SpO2: 99%  Weight: 216 lb 6.4 oz (98.2 kg)  Height: 5' 10.5 (1.791 m)   Body mass index is 30.61 kg/m.   General: Well developed, well nourished. No acute distress. CV: RRR without murmurs or rubs. Pulses 2+ bilaterally. Extremities: No edema noted. Psych: Alert and oriented. Normal mood and affect.  Health Maintenance Due  Topic Date Due   Pneumococcal Vaccine 16-27 Years old (1 of 2 - PCV) Never done   HPV VACCINES (3 - Risk male 3-dose series) 05/27/2012   Lab Results Component Ref Range & Units 10 d ago  Cholesterol, Total mg/dL 860  LDL Direct <809 mg/dL 77  HDL mg/dL 44  Triglyceride <499 mg/dL 88   Component Ref Range & Units 10 d ago  Sodium 135 - 145 mmol/L 141  Potassium 3.5 - 5.0 mmol/L 4  Chloride 98 - 108 mmol/L 105  Carbon Dioxide (CO2) 21 - 30 mmol/L 25  Urea Nitrogen (BUN) 7 - 20 mg/dL 14  Creatinine 0.6 - 1.3 mg/dL 1  Glucose 70 - 859 mg/dL 97  Calcium  8.7 - 10.2 mg/dL 9.7  AST (Aspartate  Aminotransferase) 15 - 41 U/L 22  ALT (Alanine Aminotransferase) 15 - 50 U/L 18  Bilirubin, Total 0.4 - 1.5 mg/dL 0.8  Alk Phos (Alkaline Phosphatase) 24 - 110 U/L 75  Albumin 3.5 - 4.8 g/dL 4.1  Protein, Total 6.2 - 8.1 g/dL 6.6  Anion Gap 3 - 12 mmol/L 11  BUN/CREA Ratio 6 - 27 14  Glomerular Filtration Rate (eGFR) mL/min/1.73sq m 106     Assessment & Plan:   Problem List Items Addressed This Visit  Cardiovascular and Mediastinum   Essential hypertension   Blood pressure is at goal today. Continue amlodipine 5 mg daily.      Relevant Medications   rosuvastatin (CRESTOR) 10 MG tablet   amLODipine (NORVASC) 5 MG tablet   S/P orthotopic heart transplant (HCC) - Primary   Stable.  Continue to follow with [facility] team. Continue tacrolimus 6 mg daily and mycophenolate 360 mg daily      Relevant Medications   rosuvastatin (CRESTOR) 10 MG tablet   tacrolimus (PROGRAF) 1 MG capsule   tacrolimus (PROGRAF) 5 MG capsule   amLODipine (NORVASC) 5 MG tablet   mycophenolate (MYFORTIC) 360 MG TBEC EC tablet     Genitourinary   Chronic kidney disease (CKD), stage II (mild)   Continue focus on blood pressure control, adequate hydration, and avoidance of nephrotoxic medications.        Return in about 6 months (around 04/07/2024) for Reassessment.   Garnette CHRISTELLA Simpler, MD

## 2023-10-09 NOTE — Assessment & Plan Note (Signed)
Blood pressure is at goal today. Continue amlodipine 5 mg daily.

## 2023-10-09 NOTE — Assessment & Plan Note (Signed)
 Stable.  Continue to follow with [facility] team. Continue tacrolimus 6 mg daily and mycophenolate 360 mg daily

## 2024-02-26 ENCOUNTER — Encounter (HOSPITAL_COMMUNITY): Payer: Self-pay | Admitting: Interventional Radiology

## 2024-04-08 ENCOUNTER — Ambulatory Visit: Payer: 59 | Admitting: Family Medicine

## 2024-04-08 ENCOUNTER — Encounter: Payer: Self-pay | Admitting: Family Medicine
